# Patient Record
Sex: Female | Born: 1990 | Race: White | Hispanic: No | Smoking: Former smoker
Health system: Southern US, Community
[De-identification: ages and names within clinical notes are randomized; demographics above are authoritative.]

## PROBLEM LIST (undated history)

## (undated) DIAGNOSIS — N809 Endometriosis, unspecified: Secondary | ICD-10-CM

## (undated) DIAGNOSIS — F32A Depression, unspecified: Secondary | ICD-10-CM

## (undated) DIAGNOSIS — Z9189 Other specified personal risk factors, not elsewhere classified: Secondary | ICD-10-CM

## (undated) DIAGNOSIS — B0089 Other herpesviral infection: Secondary | ICD-10-CM

## (undated) DIAGNOSIS — Z789 Other specified health status: Secondary | ICD-10-CM

## (undated) DIAGNOSIS — F319 Bipolar disorder, unspecified: Secondary | ICD-10-CM

## (undated) DIAGNOSIS — M797 Fibromyalgia: Secondary | ICD-10-CM

## (undated) DIAGNOSIS — Z803 Family history of malignant neoplasm of breast: Secondary | ICD-10-CM

## (undated) DIAGNOSIS — M5430 Sciatica, unspecified side: Secondary | ICD-10-CM

## (undated) DIAGNOSIS — F419 Anxiety disorder, unspecified: Secondary | ICD-10-CM

## (undated) DIAGNOSIS — J45909 Unspecified asthma, uncomplicated: Secondary | ICD-10-CM

## (undated) DIAGNOSIS — F329 Major depressive disorder, single episode, unspecified: Secondary | ICD-10-CM

## (undated) DIAGNOSIS — O009 Unspecified ectopic pregnancy without intrauterine pregnancy: Secondary | ICD-10-CM

## (undated) DIAGNOSIS — G5603 Carpal tunnel syndrome, bilateral upper limbs: Secondary | ICD-10-CM

## (undated) DIAGNOSIS — E039 Hypothyroidism, unspecified: Secondary | ICD-10-CM

## (undated) DIAGNOSIS — M722 Plantar fascial fibromatosis: Secondary | ICD-10-CM

## (undated) DIAGNOSIS — H9193 Unspecified hearing loss, bilateral: Secondary | ICD-10-CM

## (undated) DIAGNOSIS — K449 Diaphragmatic hernia without obstruction or gangrene: Secondary | ICD-10-CM

## (undated) HISTORY — PX: NO PAST SURGERIES: SHX2092

## (undated) HISTORY — DX: Other herpesviral infection: B00.89

## (undated) HISTORY — DX: Fibromyalgia: M79.7

## (undated) HISTORY — DX: Sciatica, unspecified side: M54.30

## (undated) HISTORY — DX: Unspecified asthma, uncomplicated: J45.909

## (undated) HISTORY — DX: Plantar fascial fibromatosis: M72.2

## (undated) HISTORY — DX: Family history of malignant neoplasm of breast: Z80.3

## (undated) HISTORY — DX: Other specified personal risk factors, not elsewhere classified: Z91.89

## (undated) HISTORY — DX: Diaphragmatic hernia without obstruction or gangrene: K44.9

## (undated) HISTORY — PX: TUBAL LIGATION: SHX77

## (undated) HISTORY — DX: Carpal tunnel syndrome, bilateral upper limbs: G56.03

---
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
## 2009-11-17 ENCOUNTER — Emergency Department: Payer: Self-pay | Admitting: Emergency Medicine

## 2010-03-12 ENCOUNTER — Emergency Department: Payer: Self-pay | Admitting: Emergency Medicine

## 2010-11-14 ENCOUNTER — Emergency Department: Payer: Self-pay | Admitting: Emergency Medicine

## 2011-06-06 ENCOUNTER — Encounter: Payer: Self-pay | Admitting: Maternal & Fetal Medicine

## 2011-06-20 ENCOUNTER — Emergency Department: Payer: Self-pay | Admitting: Internal Medicine

## 2011-12-03 ENCOUNTER — Inpatient Hospital Stay: Payer: Self-pay

## 2011-12-03 LAB — DRUG SCREEN, URINE
Amphetamines, Ur Screen: NEGATIVE (ref ?–1000)
Barbiturates, Ur Screen: NEGATIVE (ref ?–200)
Benzodiazepine, Ur Scrn: NEGATIVE (ref ?–200)
Cannabinoid 50 Ng, Ur ~~LOC~~: NEGATIVE (ref ?–50)
Methadone, Ur Screen: NEGATIVE (ref ?–300)
Opiate, Ur Screen: NEGATIVE (ref ?–300)
Phencyclidine (PCP) Ur S: NEGATIVE (ref ?–25)

## 2011-12-03 LAB — CBC WITH DIFFERENTIAL/PLATELET
Basophil #: 0 10*3/uL (ref 0.0–0.1)
Eosinophil #: 0 10*3/uL (ref 0.0–0.7)
Eosinophil %: 0 %
HGB: 10.6 g/dL — ABNORMAL LOW (ref 12.0–16.0)
MCH: 31.5 pg (ref 26.0–34.0)
MCHC: 33.2 g/dL (ref 32.0–36.0)
MCV: 95 fL (ref 80–100)
Monocyte #: 1 10*3/uL — ABNORMAL HIGH (ref 0.0–0.7)
Neutrophil #: 14.4 10*3/uL — ABNORMAL HIGH (ref 1.4–6.5)
Neutrophil %: 87.5 %
Platelet: 98 10*3/uL — ABNORMAL LOW (ref 150–440)
RDW: 13.3 % (ref 11.5–14.5)
WBC: 16.5 10*3/uL — ABNORMAL HIGH (ref 3.6–11.0)

## 2011-12-05 LAB — HEMATOCRIT: HCT: 27.1 % — ABNORMAL LOW (ref 35.0–47.0)

## 2012-10-12 ENCOUNTER — Ambulatory Visit: Payer: Self-pay | Admitting: Internal Medicine

## 2012-10-28 ENCOUNTER — Ambulatory Visit: Payer: Self-pay | Admitting: Internal Medicine

## 2012-11-19 ENCOUNTER — Ambulatory Visit: Payer: Self-pay | Admitting: Internal Medicine

## 2013-03-30 ENCOUNTER — Ambulatory Visit: Payer: Self-pay | Admitting: Physician Assistant

## 2013-05-22 ENCOUNTER — Emergency Department: Payer: Self-pay | Admitting: Emergency Medicine

## 2013-05-22 LAB — URINALYSIS, COMPLETE
Bilirubin,UR: NEGATIVE
Glucose,UR: NEGATIVE mg/dL (ref 0–75)
Ketone: NEGATIVE
Nitrite: NEGATIVE
Ph: 6 (ref 4.5–8.0)
Specific Gravity: 1.023 (ref 1.003–1.030)
Squamous Epithelial: 5

## 2013-05-22 LAB — COMPREHENSIVE METABOLIC PANEL
Albumin: 3.6 g/dL (ref 3.4–5.0)
Alkaline Phosphatase: 100 U/L (ref 50–136)
Anion Gap: 10 (ref 7–16)
BUN: 9 mg/dL (ref 7–18)
Calcium, Total: 8.4 mg/dL — ABNORMAL LOW (ref 8.5–10.1)
Chloride: 107 mmol/L (ref 98–107)
Co2: 22 mmol/L (ref 21–32)
Creatinine: 0.88 mg/dL (ref 0.60–1.30)
EGFR (African American): >60
Osmolality: 278 (ref 275–301)
Potassium: 3.9 mmol/L (ref 3.5–5.1)
SGPT (ALT): 20 U/L (ref 12–78)
Sodium: 139 mmol/L (ref 136–145)
Total Protein: 7.6 g/dL (ref 6.4–8.2)

## 2013-05-22 LAB — ETHANOL: Ethanol: <3 mg/dL

## 2013-05-22 LAB — CBC
HCT: 37.3 % (ref 35.0–47.0)
HGB: 12.7 g/dL (ref 12.0–16.0)
MCH: 30 pg (ref 26.0–34.0)
MCHC: 34.1 g/dL (ref 32.0–36.0)
MCV: 88 fL (ref 80–100)
Platelet: 230 10*3/uL (ref 150–440)
RBC: 4.24 10*6/uL (ref 3.80–5.20)
RDW: 13 % (ref 11.5–14.5)

## 2013-05-22 LAB — DRUG SCREEN, URINE
Methadone, Ur Screen: NEGATIVE (ref ?–300)
Opiate, Ur Screen: NEGATIVE (ref ?–300)
Phencyclidine (PCP) Ur S: NEGATIVE (ref ?–25)
Tricyclic, Ur Screen: NEGATIVE (ref ?–1000)

## 2013-05-22 LAB — TSH: Thyroid Stimulating Horm: 2.44 u[IU]/mL

## 2013-06-29 ENCOUNTER — Ambulatory Visit: Payer: Self-pay | Admitting: Internal Medicine

## 2014-08-26 ENCOUNTER — Ambulatory Visit: Payer: Self-pay | Admitting: Internal Medicine

## 2014-08-26 LAB — HCG, QUANTITATIVE, PREGNANCY: Beta Hcg, Quant.: <1 m[IU]/mL — ABNORMAL LOW

## 2015-03-10 NOTE — Consult Note (Signed)
Brief Consult Note: Diagnosis: chronic depression.   Patient was seen by consultant.   Consult note dictated.   Discussed with Attending MD.   Comments: Psychiatry: Patient seen and chart reviewed. Also spoke with patient's husband. Patient has chronic depression and irritability and had voiced SI yesterday but now has rested and calmed down and denies any wish to die. Able to cite positive things in her life to live for and agrees to continue outpt care. Currently calm affect, lucid without agitation or thought disorder, and able to think through problems rationally. Husband agrees she is not at risk of self injury if discharged and is fine with her coming home. ER attending agrees. Pt can go home with no med change and [facility].  Electronic Signatures: Audery Amellapacs, Jontavius Rabalais T (MD)  (Signed 06-Jul-14 14:25)  Authored: Brief Consult Note   Last Updated: 06-Jul-14 14:25 by Audery Amellapacs, Delontae Lamm T (MD)
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
## 2015-03-28 NOTE — H&P (Signed)
L&D Evaluation:  History:   HPI 24 year old G2 P0010 with EDC=12/07/2011 by ultrasound presented with c/o painful ctxs. Denies bleeding or LOF. Prenatal care [facility] at [facility] and [facility]. Prenatal course remarkable for bipolar disorder/ADHD/PTSD/anxiety and [facility] and is currently on Seroquel 50 mgm in PM and Zoloft 100 mgm daily and prn Xanax 0.25 mgm. Hx of polysubstance abuse/use of MJ this pregnancy. (UDS 12/6 negative). BMI>30    Presents with contractions    Patient's Medical History Bipolar disorder/ADHD/Anxiety/PTSD. Obesity.    Patient's Surgical History none    Medications Pre Natal Vitamins  Iron  Serroquel 50 mgm daily, Zoloft 100 mgm daily, Xanax 0.25 mgm daily prn    Allergies other, seafood    Social History Hx polysubstance abuse. MJ use this pregnancy.    Family History Non-Contributory   ROS:   ROS see HPI   Exam:   Vital Signs stable    General breathing with ctxs    Mental Status clear    Chest clear    Heart normal sinus rhythm, no murmur/gallop/rubs    Abdomen gravid, tender with contractions    Estimated Fetal Weight Average for gestational age    Fetal Position vtx    Reflexes 2+    Pelvic no external lesions, 3/80%/-1    Mebranes Intact    FHT 120 with accels and mod variability. two isolated mild variable decels to 90-100    FHT Description mod variability    Ucx q274min    Skin dry   Impression:   Impression IUP at 39 3/7 weeks in early labor   Plan:   Plan Expectant management. Stadol for pain. Epidural when progressing   Electronic Signatures: Trinna BalloonGutierrez, Bilan Tedesco L (CNM)  (Signed 15-Jan-13 14:03)  Authored: L&D Evaluation   Last Updated: 15-Jan-13 14:03 by Trinna BalloonGutierrez, Rica Heather L (CNM)

## 2015-06-02 ENCOUNTER — Other Ambulatory Visit: Payer: Self-pay | Admitting: Physician Assistant

## 2015-06-02 ENCOUNTER — Ambulatory Visit
Admission: RE | Admit: 2015-06-02 | Discharge: 2015-06-02 | Disposition: A | Discharge status: Home / Self Care | Payer: Medicaid Other | Source: Ambulatory Visit | Attending: Physician Assistant | Admitting: Physician Assistant

## 2015-06-02 DIAGNOSIS — M5440 Lumbago with sciatica, unspecified side: Principal | ICD-10-CM

## 2015-06-02 DIAGNOSIS — M25559 Pain in unspecified hip: Secondary | ICD-10-CM | POA: Diagnosis present

## 2015-06-02 DIAGNOSIS — M545 Low back pain, unspecified: Secondary | ICD-10-CM

## 2015-06-02 DIAGNOSIS — M549 Dorsalgia, unspecified: Secondary | ICD-10-CM | POA: Diagnosis not present

## 2015-12-05 ENCOUNTER — Emergency Department
Admission: EM | Admit: 2015-12-05 | Discharge: 2015-12-05 | Disposition: A | Discharge status: Home / Self Care | Payer: Medicaid Other

## 2015-12-05 NOTE — ED Notes (Signed)
No answer when called from lobby 

## 2016-11-13 ENCOUNTER — Encounter: Payer: Self-pay | Admitting: Emergency Medicine

## 2016-11-13 ENCOUNTER — Emergency Department
Admission: EM | Admit: 2016-11-13 | Discharge: 2016-11-13 | Disposition: A | Discharge status: Home / Self Care | Payer: Self-pay | Attending: Emergency Medicine | Admitting: Emergency Medicine

## 2016-11-13 ENCOUNTER — Emergency Department: Payer: Self-pay

## 2016-11-13 DIAGNOSIS — O200 Threatened abortion: Secondary | ICD-10-CM | POA: Insufficient documentation

## 2016-11-13 DIAGNOSIS — F17200 Nicotine dependence, unspecified, uncomplicated: Secondary | ICD-10-CM | POA: Insufficient documentation

## 2016-11-13 DIAGNOSIS — Z3A01 Less than 8 weeks gestation of pregnancy: Secondary | ICD-10-CM | POA: Insufficient documentation

## 2016-11-13 DIAGNOSIS — O99331 Smoking (tobacco) complicating pregnancy, first trimester: Secondary | ICD-10-CM | POA: Insufficient documentation

## 2016-11-13 DIAGNOSIS — R102 Pelvic and perineal pain: Secondary | ICD-10-CM

## 2016-11-13 LAB — CBC WITH DIFFERENTIAL/PLATELET
BASOS ABS: 0 10*3/uL (ref 0–0.1)
BASOS PCT: 1 %
EOS ABS: 0.3 10*3/uL (ref 0–0.7)
Eosinophils Relative: 3 %
HEMATOCRIT: 35.9 % (ref 35.0–47.0)
Hemoglobin: 12.1 g/dL (ref 12.0–16.0)
Lymphocytes Relative: 27 %
Lymphs Abs: 2.6 10*3/uL (ref 1.0–3.6)
MCH: 30.3 pg (ref 26.0–34.0)
MCHC: 33.6 g/dL (ref 32.0–36.0)
MCV: 90.2 fL (ref 80.0–100.0)
MONOS PCT: 5 %
Monocytes Absolute: 0.5 10*3/uL (ref 0.2–0.9)
NEUTROS ABS: 6.2 10*3/uL (ref 1.4–6.5)
Neutrophils Relative %: 64 %
PLATELETS: 262 10*3/uL (ref 150–440)
RBC: 3.98 MIL/uL (ref 3.80–5.20)
RDW: 13.4 % (ref 11.5–14.5)
WBC: 9.7 10*3/uL (ref 3.6–11.0)

## 2016-11-13 LAB — URINALYSIS, COMPLETE (UACMP) WITH MICROSCOPIC
BACTERIA UA: NONE SEEN
BILIRUBIN URINE: NEGATIVE
Glucose, UA: NEGATIVE mg/dL
HGB URINE DIPSTICK: NEGATIVE
KETONES UR: NEGATIVE mg/dL
LEUKOCYTES UA: NEGATIVE
NITRITE: NEGATIVE
Protein, ur: NEGATIVE mg/dL
RBC / HPF: NONE SEEN RBC/hpf (ref 0–5)
SPECIFIC GRAVITY, URINE: 1.018 (ref 1.005–1.030)
pH: 7 (ref 5.0–8.0)

## 2016-11-13 LAB — COMPREHENSIVE METABOLIC PANEL
ALBUMIN: 3.8 g/dL (ref 3.5–5.0)
ALT: 11 U/L — ABNORMAL LOW (ref 14–54)
ANION GAP: 6 (ref 5–15)
AST: 15 U/L (ref 15–41)
Alkaline Phosphatase: 54 U/L (ref 38–126)
BUN: 7 mg/dL (ref 6–20)
CHLORIDE: 107 mmol/L (ref 101–111)
CO2: 26 mmol/L (ref 22–32)
Calcium: 8.9 mg/dL (ref 8.9–10.3)
Creatinine, Ser: 0.78 mg/dL (ref 0.44–1.00)
GFR calc Af Amer: >60 mL/min (ref >60)
GFR calc non Af Amer: >60 mL/min (ref >60)
GLUCOSE: 91 mg/dL (ref 65–99)
POTASSIUM: 4.1 mmol/L (ref 3.5–5.1)
SODIUM: 139 mmol/L (ref 135–145)
TOTAL PROTEIN: 7.3 g/dL (ref 6.5–8.1)

## 2016-11-13 LAB — HCG, QUANTITATIVE, PREGNANCY: hCG, Beta Chain, Quant, S: 350 m[IU]/mL — ABNORMAL HIGH (ref <5)

## 2016-11-13 LAB — POCT PREGNANCY, URINE: PREG TEST UR: POSITIVE — AB

## 2016-11-13 MED ORDER — ACETAMINOPHEN 500 MG PO TABS
1000.0000 mg | ORAL_TABLET | Freq: Once | ORAL | Status: AC
  Administered 2016-11-13: 1000 mg via ORAL
  Filled 2016-11-13: qty 2

## 2016-11-13 NOTE — ED Notes (Signed)
Patient transported to Ultrasound 

## 2016-11-13 NOTE — ED Provider Notes (Signed)
[facility] Emergency Department Provider Note        Time seen: ----------------------------------------- 2:38 PM on 11/13/2016 -----------------------------------------    I have reviewed the triage vital signs and the nursing notes.   HISTORY  Chief Complaint Vaginal Bleeding    HPI Claire Cari CarawayLloyd Ashley is a 25 y.o. female who presents to ER for vaginal bleeding this morning. Patient states she's approximate [redacted] weeks pregnant she began having some abdominal pain and cramping as well as vaginal spotting. She is G3 P1 Ab1. She denies fevers, chills, dysuria but has has some breast soreness.   History reviewed. No pertinent past medical history.  There are no active problems to display for this patient.   History reviewed. No pertinent surgical history.  Allergies Patient has no known allergies.  Social History Social History  Substance Use Topics  . Smoking status: Current Every Day Smoker  . Smokeless tobacco: Never Used  . Alcohol use Yes    Review of Systems Constitutional: Negative for fever. Cardiovascular: Negative for chest pain. Respiratory: Negative for shortness of breath. Gastrointestinal: Positive for abdominal pain Genitourinary: Negative for dysuria.Positive for vaginal bleeding Musculoskeletal: Negative for back pain. Skin: Negative for rash. Neurological: Negative for headaches, focal weakness or numbness.  10-point ROS otherwise negative.  ____________________________________________   PHYSICAL EXAM:  VITAL SIGNS: ED Triage Vitals  Enc Vitals Group     BP 11/13/16 1350 127/86     Pulse Rate 11/13/16 1350 92     Resp 11/13/16 1350 20     Temp 11/13/16 1350 97.4 F (36.3 C)     Temp Source 11/13/16 1350 Oral     SpO2 11/13/16 1350 99 %     Weight 11/13/16 1351 215 lb (97.5 kg)     Height 11/13/16 1351 5\' 4"  (1.626 m)     Head Circumference --      Peak Flow --      Pain Score 11/13/16 1351 9     Pain Loc --       Pain Edu? --      Excl. in GC? --     Constitutional: Alert and oriented. Well appearing and in no distress. Eyes: Conjunctivae are normal. PERRL. Normal extraocular movements. ENT   Head: Normocephalic and atraumatic.   Nose: No congestion/rhinnorhea.   Mouth/Throat: Mucous membranes are moist.   Neck: No stridor. Cardiovascular: Normal rate, regular rhythm. No murmurs, rubs, or gallops. Respiratory: Normal respiratory effort without tachypnea nor retractions. Breath sounds are clear and equal bilaterally. No wheezes/rales/rhonchi. Gastrointestinal: Soft and nontender. Normal bowel sounds Musculoskeletal: Nontender with normal range of motion in all extremities. No lower extremity tenderness nor edema. Neurologic:  Normal speech and language. No gross focal neurologic deficits are appreciated.  Skin:  Skin is warm, dry and intact. No rash noted. Psychiatric: Mood and affect are normal. Speech and behavior are normal.  ____________________________________________  ED COURSE:  Pertinent labs & imaging results that were available during my care of the patient were reviewed by me and considered in my medical decision making (see chart for details). Clinical Course   Patient presents to the ER to distress, likely threatened miscarriage. We will assess with labs and ultrasound.  Procedures ____________________________________________   LABS (pertinent positives/negatives)  Labs Reviewed  COMPREHENSIVE METABOLIC PANEL - Abnormal; Notable for the following:       Result Value   ALT 11 (*)    Total Bilirubin <0.1 (*)    All other components within normal limits  HCG, QUANTITATIVE, PREGNANCY - Abnormal; Notable for the following:    hCG, Beta Chain, Quant, S 350 (*)    All other components within normal limits  URINALYSIS, COMPLETE (UACMP) WITH MICROSCOPIC - Abnormal; Notable for the following:    Color, Urine YELLOW (*)    APPearance CLEAR (*)    Squamous  Epithelial / LPF 0-5 (*)    All other components within normal limits  POCT PREGNANCY, URINE - Abnormal; Notable for the following:    Preg Test, Ur POSITIVE (*)    All other components within normal limits  CBC WITH DIFFERENTIAL/PLATELET  POC URINE PREG, ED    RADIOLOGY Pregnancy ultrasound IMPRESSION: 1. Possible intrauterine gestational sac but no confirmatory yolk sac or fetal pole. Recommend follow-up quantitative B-HCG levels and follow-up US in 14 days to assess viability. This recommendation follows SRU consensus guidelines: Diagnostic Criteria for Nonviable Pregnancy Early in the First Trimester. Malva Limes Engl J Med 2013; 161:0960-45; 369:1443-51. 2. Negative for ovarian torsion. ____________________________________________  FINAL ASSESSMENT AND PLAN  Threatened miscarriage  Plan: Patient with labs and imaging as dictated above. Ultrasound findings are indeterminate. She'll be advised to follow-up for repeat hCG levels and possibly ultrasound as well. She is stable for outpatient follow-up.   Emily FilbertWilliams, Gunnard Dorrance E, MD   Note: This dictation was prepared with Dragon dictation. Any transcriptional errors that result from this process are unintentional    Emily FilbertJonathan E Mercer Peifer, MD 11/13/16 [phone]

## 2016-11-13 NOTE — ED Triage Notes (Signed)
Pt to ed with c/o vaginal bleeding this am.  Pt states she is approx [redacted] weeks pregnant and is having abd pain and cramping.  G3 AB1 P1

## 2016-11-23 ENCOUNTER — Encounter: Payer: Self-pay | Admitting: Emergency Medicine

## 2016-11-23 DIAGNOSIS — O039 Complete or unspecified spontaneous abortion without complication: Principal | ICD-10-CM | POA: Insufficient documentation

## 2016-11-23 DIAGNOSIS — O99331 Smoking (tobacco) complicating pregnancy, first trimester: Secondary | ICD-10-CM | POA: Insufficient documentation

## 2016-11-23 DIAGNOSIS — F17210 Nicotine dependence, cigarettes, uncomplicated: Secondary | ICD-10-CM | POA: Insufficient documentation

## 2016-11-23 DIAGNOSIS — Z3A01 Less than 8 weeks gestation of pregnancy: Secondary | ICD-10-CM | POA: Insufficient documentation

## 2016-11-23 NOTE — ED Triage Notes (Signed)
Pt is experiencing abdominal pain and is around six pregnant at this time. Pt has been experiencing spotting and now some clots are emerging. Pt reports lower right pelvic pain. Pt is ambulatory and in NAD at this time in triage.

## 2016-11-24 ENCOUNTER — Emergency Department: Payer: Medicaid Other

## 2016-11-24 ENCOUNTER — Observation Stay
Admission: EM | Admit: 2016-11-24 | Discharge: 2016-11-24 | Disposition: A | Discharge status: Home / Self Care | Payer: Medicaid Other | Attending: Obstetrics and Gynecology | Admitting: Obstetrics and Gynecology

## 2016-11-24 ENCOUNTER — Other Ambulatory Visit: Payer: Medicaid Other

## 2016-11-24 DIAGNOSIS — O009 Unspecified ectopic pregnancy without intrauterine pregnancy: Secondary | ICD-10-CM

## 2016-11-24 DIAGNOSIS — N939 Abnormal uterine and vaginal bleeding, unspecified: Secondary | ICD-10-CM

## 2016-11-24 DIAGNOSIS — O00101 Right tubal pregnancy without intrauterine pregnancy: Secondary | ICD-10-CM

## 2016-11-24 HISTORY — DX: Endometriosis, unspecified: N80.9

## 2016-11-24 HISTORY — DX: Unspecified ectopic pregnancy without intrauterine pregnancy: O00.90

## 2016-11-24 LAB — CBC WITH DIFFERENTIAL/PLATELET
BASOS ABS: 0 10*3/uL (ref 0–0.1)
BASOS ABS: 0.1 10*3/uL (ref 0–0.1)
Basophils Relative: 0 %
Basophils Relative: 1 %
Eosinophils Absolute: 0.5 10*3/uL (ref 0–0.7)
Eosinophils Absolute: 0.5 10*3/uL (ref 0–0.7)
Eosinophils Relative: 5 %
Eosinophils Relative: 5 %
HCT: 33.3 % — ABNORMAL LOW (ref 35.0–47.0)
HEMATOCRIT: 36.8 % (ref 35.0–47.0)
HEMOGLOBIN: 11.1 g/dL — AB (ref 12.0–16.0)
Hemoglobin: 12.2 g/dL (ref 12.0–16.0)
LYMPHS PCT: 41 %
LYMPHS PCT: 42 %
Lymphs Abs: 4.1 10*3/uL — ABNORMAL HIGH (ref 1.0–3.6)
Lymphs Abs: 4.7 10*3/uL — ABNORMAL HIGH (ref 1.0–3.6)
MCH: 29.8 pg (ref 26.0–34.0)
MCH: 29.8 pg (ref 26.0–34.0)
MCHC: 33 g/dL (ref 32.0–36.0)
MCHC: 33.3 g/dL (ref 32.0–36.0)
MCV: 89.7 fL (ref 80.0–100.0)
MCV: 90.4 fL (ref 80.0–100.0)
MONO ABS: 0.6 10*3/uL (ref 0.2–0.9)
MONOS PCT: 5 %
Monocytes Absolute: 0.5 10*3/uL (ref 0.2–0.9)
Monocytes Relative: 5 %
NEUTROS ABS: 4.8 10*3/uL (ref 1.4–6.5)
NEUTROS ABS: 5.3 10*3/uL (ref 1.4–6.5)
NEUTROS PCT: 49 %
Neutrophils Relative %: 47 %
Platelets: 212 10*3/uL (ref 150–440)
Platelets: 236 10*3/uL (ref 150–440)
RBC: 3.72 MIL/uL — AB (ref 3.80–5.20)
RBC: 4.08 MIL/uL (ref 3.80–5.20)
RDW: 13.5 % (ref 11.5–14.5)
RDW: 13.5 % (ref 11.5–14.5)
WBC: 10 10*3/uL (ref 3.6–11.0)
WBC: 11.3 10*3/uL — ABNORMAL HIGH (ref 3.6–11.0)

## 2016-11-24 LAB — URINALYSIS, ROUTINE W REFLEX MICROSCOPIC
Bilirubin Urine: NEGATIVE
GLUCOSE, UA: NEGATIVE mg/dL
Ketones, ur: NEGATIVE mg/dL
Leukocytes, UA: NEGATIVE
Nitrite: NEGATIVE
PH: 5 (ref 5.0–8.0)
Protein, ur: 30 mg/dL — AB
Specific Gravity, Urine: 1.024 (ref 1.005–1.030)

## 2016-11-24 LAB — BASIC METABOLIC PANEL
ANION GAP: 6 (ref 5–15)
BUN: 10 mg/dL (ref 6–20)
CALCIUM: 8.9 mg/dL (ref 8.9–10.3)
CHLORIDE: 106 mmol/L (ref 101–111)
CO2: 25 mmol/L (ref 22–32)
Creatinine, Ser: 0.64 mg/dL (ref 0.44–1.00)
GFR calc Af Amer: >60 mL/min (ref >60)
GFR calc non Af Amer: >60 mL/min (ref >60)
GLUCOSE: 88 mg/dL (ref 65–99)
Potassium: 3.6 mmol/L (ref 3.5–5.1)
Sodium: 137 mmol/L (ref 135–145)

## 2016-11-24 LAB — HCG, QUANTITATIVE, PREGNANCY
HCG, BETA CHAIN, QUANT, S: 1772 m[IU]/mL — AB (ref <5)
hCG, Beta Chain, Quant, S: 1268 m[IU]/mL — ABNORMAL HIGH (ref <5)

## 2016-11-24 MED ORDER — MORPHINE SULFATE (PF) 4 MG/ML IV SOLN
INTRAVENOUS | Status: AC
  Administered 2016-11-24: 4 mg via INTRAVENOUS
  Filled 2016-11-24: qty 1

## 2016-11-24 MED ORDER — ONDANSETRON HCL 4 MG/2ML IJ SOLN
4.0000 mg | Freq: Once | INTRAMUSCULAR | Status: AC
  Administered 2016-11-24: 4 mg via INTRAVENOUS

## 2016-11-24 MED ORDER — KETOROLAC TROMETHAMINE 30 MG/ML IJ SOLN
30.0000 mg | Freq: Four times a day (QID) | INTRAMUSCULAR | Status: DC | PRN
  Administered 2016-11-24: 30 mg via INTRAVENOUS
  Filled 2016-11-24: qty 1

## 2016-11-24 MED ORDER — ONDANSETRON HCL 4 MG/2ML IJ SOLN
INTRAMUSCULAR | Status: AC
  Administered 2016-11-24: 4 mg via INTRAVENOUS
  Filled 2016-11-24: qty 2

## 2016-11-24 MED ORDER — IBUPROFEN 600 MG PO TABS: 600.0000 mg | ORAL_TABLET | Freq: Four times a day (QID) | ORAL | 3 refills | Status: DC | PRN

## 2016-11-24 MED ORDER — ONDANSETRON HCL 4 MG/2ML IJ SOLN: 4.0000 mg | Freq: Four times a day (QID) | INTRAMUSCULAR | Status: DC | PRN

## 2016-11-24 MED ORDER — PRENATAL MULTIVITAMIN CH: 1.0000 | ORAL_TABLET | Freq: Every day | ORAL | Status: DC

## 2016-11-24 MED ORDER — MORPHINE SULFATE (PF) 4 MG/ML IV SOLN
4.0000 mg | Freq: Once | INTRAVENOUS | Status: AC
  Administered 2016-11-24: 4 mg via INTRAVENOUS

## 2016-11-24 MED ORDER — SODIUM CHLORIDE 0.9 % IV BOLUS (SEPSIS)
1000.0000 mL | Freq: Once | INTRAVENOUS | Status: AC
  Administered 2016-11-24: 1000 mL via INTRAVENOUS

## 2016-11-24 MED ORDER — INFLUENZA VAC SPLIT QUAD 0.5 ML IM SUSY: 0.5000 mL | PREFILLED_SYRINGE | INTRAMUSCULAR | Status: DC

## 2016-11-24 NOTE — Final Progress Note (Signed)
Physician Final Progress Note  Patient ID: Claire Frederick [name] MRN: [id number] DOB/AGE: Oct 10, 1991 26 y.o.  Admit date: 11/24/2016 Admitting provider: Vena AustriaAndreas Kiarah Eckstein, MD Discharge date: 11/24/2016   Admission Diagnoses: Completed abortion  Discharge Diagnoses: Completed abortion  Consults: None  HCG drop of 500 in 6-hrs, no further cramping or abdominal pain.  Still some bleeding as would be expected following SAB, follow up in clinic with myself sometime next week  Significant Findings/ Diagnostic Studies:  Results for orders placed or performed during the hospital encounter of 11/24/16 (from the past 24 hour(s))  hCG, quantitative, pregnancy     Status: Abnormal   Collection Time: 11/23/16 11:43 PM  Result Value Ref Range   hCG, Beta Chain, Quant, S 1,772 (H) <5 mIU/mL  Urinalysis, Routine w reflex microscopic     Status: Abnormal   Collection Time: 11/23/16 11:43 PM  Result Value Ref Range   Color, Urine YELLOW (A) YELLOW   APPearance HAZY (A) CLEAR   Specific Gravity, Urine 1.024 1.005 - 1.030   pH 5.0 5.0 - 8.0   Glucose, UA NEGATIVE NEGATIVE mg/dL   Hgb urine dipstick LARGE (A) NEGATIVE   Bilirubin Urine NEGATIVE NEGATIVE   Ketones, ur NEGATIVE NEGATIVE mg/dL   Protein, ur 30 (A) NEGATIVE mg/dL   Nitrite NEGATIVE NEGATIVE   Leukocytes, UA NEGATIVE NEGATIVE   RBC / HPF TOO NUMEROUS TO COUNT 0 - 5 RBC/hpf   WBC, UA TOO NUMEROUS TO COUNT 0 - 5 WBC/hpf   Bacteria, UA RARE (A) NONE SEEN   Squamous Epithelial / LPF 0-5 (A) NONE SEEN   Mucous PRESENT   CBC with Differential     Status: Abnormal   Collection Time: 11/23/16 11:43 PM  Result Value Ref Range   WBC 11.3 (H) 3.6 - 11.0 K/uL   RBC 4.08 3.80 - 5.20 MIL/uL   Hemoglobin 12.2 12.0 - 16.0 g/dL   HCT 40.936.8 81.135.0 - 91.447.0 %   MCV 90.4 80.0 - 100.0 fL   MCH 29.8 26.0 - 34.0 pg   MCHC 33.0 32.0 - 36.0 g/dL   RDW 78.213.5 95.611.5 - 21.314.5 %   Platelets 236 150 - 440 K/uL   Neutrophils Relative % 47 %   Neutro Abs 5.3 1.4 - 6.5  K/uL   Lymphocytes Relative 42 %   Lymphs Abs 4.7 (H) 1.0 - 3.6 K/uL   Monocytes Relative 5 %   Monocytes Absolute 0.6 0.2 - 0.9 K/uL   Eosinophils Relative 5 %   Eosinophils Absolute 0.5 0 - 0.7 K/uL   Basophils Relative 1 %   Basophils Absolute 0.1 0 - 0.1 K/uL  Basic metabolic panel     Status: None   Collection Time: 11/23/16 11:43 PM  Result Value Ref Range   Sodium 137 135 - 145 mmol/L   Potassium 3.6 3.5 - 5.1 mmol/L   Chloride 106 101 - 111 mmol/L   CO2 25 22 - 32 mmol/L   Glucose, Bld 88 65 - 99 mg/dL   BUN 10 6 - 20 mg/dL   Creatinine, Ser 0.860.64 0.44 - 1.00 mg/dL   Calcium 8.9 8.9 - 57.810.3 mg/dL   GFR calc non Af Amer >60 >60 mL/min   GFR calc Af Amer >60 >60 mL/min   Anion gap 6 5 - 15  CBC WITH DIFFERENTIAL     Status: Abnormal   Collection Time: 11/24/16  6:12 AM  Result Value Ref Range   WBC 10.0 3.6 - 11.0 K/uL   RBC  3.72 (L) 3.80 - 5.20 MIL/uL   Hemoglobin 11.1 (L) 12.0 - 16.0 g/dL   HCT 53.6 (L) 64.4 - 03.4 %   MCV 89.7 80.0 - 100.0 fL   MCH 29.8 26.0 - 34.0 pg   MCHC 33.3 32.0 - 36.0 g/dL   RDW 74.2 59.5 - 63.8 %   Platelets 212 150 - 440 K/uL   Neutrophils Relative % 49 %   Neutro Abs 4.8 1.4 - 6.5 K/uL   Lymphocytes Relative 41 %   Lymphs Abs 4.1 (H) 1.0 - 3.6 K/uL   Monocytes Relative 5 %   Monocytes Absolute 0.5 0.2 - 0.9 K/uL   Eosinophils Relative 5 %   Eosinophils Absolute 0.5 0 - 0.7 K/uL   Basophils Relative 0 %   Basophils Absolute 0.0 0 - 0.1 K/uL  hCG, quantitative, pregnancy     Status: Abnormal   Collection Time: 11/24/16  6:12 AM  Result Value Ref Range   hCG, Beta Chain, Quant, S 1,268 (H) <5 mIU/mL   US Ob Comp < 14 Wks  Result Date: 11/24/2016 CLINICAL DATA:  Vaginal bleeding EXAM: OBSTETRIC <14 WK [place] AND TRANSVAGINAL OB US DOPPLER ULTRASOUND OF OVARIES TECHNIQUE: Both transabdominal and transvaginal ultrasound examinations were performed for complete evaluation of the gestation as well as the maternal uterus, adnexal regions, and  pelvic cul-de-sac. Transvaginal technique was performed to assess early pregnancy. Color and duplex Doppler ultrasound was utilized to evaluate blood flow to the ovaries. COMPARISON:  11/13/2016 FINDINGS: Intrauterine gestational sac: Not visualized Yolk sac:  Not visualized Embryo:  Not visualized Cardiac Activity: Not visualized Maternal uterus/adnexae: Left ovary is within normal limits measuring 3.3 x 1.9 by 2.3 cm. The right ovary measures 3.1 x 1.8 x 3.8 cm. Slightly superior to the right ovary is a heterogenous hyper and hypoechoic masslike area measuring 2.9 x 2.2 by 3 cm. Minimal peripheral vascularity is present. Pulsed Doppler evaluation of both ovaries demonstrates normal appearing low-resistance arterial and venous waveforms. IMPRESSION: 1. No sonographic evidence for ovarian torsion 2. Previously suggested gestational sac within the uterus is not identified on the current exam. There is a heterogenous 3 cm hyper and hypoechoic masslike area that appears separate from the right ovary. In the setting of increasing HCG, and nonvisualization of intrauterine pregnancy, this is concerning for possible ectopic pregnancy. Critical Value/emergent results were called by telephone at the time of interpretation on 11/24/2016 at 2:13 am to Dr. Don Perking, who verbally acknowledged these results. Electronically Signed   By: Jasmine Pang M.D.   On: 11/24/2016 02:14   US Ob Transvaginal  Result Date: 11/24/2016 CLINICAL DATA:  Vaginal bleeding EXAM: OBSTETRIC <14 WK [place] AND TRANSVAGINAL OB US DOPPLER ULTRASOUND OF OVARIES TECHNIQUE: Both transabdominal and transvaginal ultrasound examinations were performed for complete evaluation of the gestation as well as the maternal uterus, adnexal regions, and pelvic cul-de-sac. Transvaginal technique was performed to assess early pregnancy. Color and duplex Doppler ultrasound was utilized to evaluate blood flow to the ovaries. COMPARISON:  11/13/2016 FINDINGS: Intrauterine  gestational sac: Not visualized Yolk sac:  Not visualized Embryo:  Not visualized Cardiac Activity: Not visualized Maternal uterus/adnexae: Left ovary is within normal limits measuring 3.3 x 1.9 by 2.3 cm. The right ovary measures 3.1 x 1.8 x 3.8 cm. Slightly superior to the right ovary is a heterogenous hyper and hypoechoic masslike area measuring 2.9 x 2.2 by 3 cm. Minimal peripheral vascularity is present. Pulsed Doppler evaluation of both ovaries demonstrates normal appearing low-resistance arterial and  venous waveforms. IMPRESSION: 1. No sonographic evidence for ovarian torsion 2. Previously suggested gestational sac within the uterus is not identified on the current exam. There is a heterogenous 3 cm hyper and hypoechoic masslike area that appears separate from the right ovary. In the setting of increasing HCG, and nonvisualization of intrauterine pregnancy, this is concerning for possible ectopic pregnancy. Critical Value/emergent results were called by telephone at the time of interpretation on 11/24/2016 at 2:13 am to Dr. Don Perking, who verbally acknowledged these results. Electronically Signed   By: Jasmine Pang M.D.   On: 11/24/2016 02:14   [place] Art/ven Flow Abd Pelv Doppler  Result Date: 11/24/2016 CLINICAL DATA:  Vaginal bleeding EXAM: OBSTETRIC <14 WK [place] AND TRANSVAGINAL OB US DOPPLER ULTRASOUND OF OVARIES TECHNIQUE: Both transabdominal and transvaginal ultrasound examinations were performed for complete evaluation of the gestation as well as the maternal uterus, adnexal regions, and pelvic cul-de-sac. Transvaginal technique was performed to assess early pregnancy. Color and duplex Doppler ultrasound was utilized to evaluate blood flow to the ovaries. COMPARISON:  11/13/2016 FINDINGS: Intrauterine gestational sac: Not visualized Yolk sac:  Not visualized Embryo:  Not visualized Cardiac Activity: Not visualized Maternal uterus/adnexae: Left ovary is within normal limits measuring 3.3 x 1.9 by 2.3 cm. The  right ovary measures 3.1 x 1.8 x 3.8 cm. Slightly superior to the right ovary is a heterogenous hyper and hypoechoic masslike area measuring 2.9 x 2.2 by 3 cm. Minimal peripheral vascularity is present. Pulsed Doppler evaluation of both ovaries demonstrates normal appearing low-resistance arterial and venous waveforms. IMPRESSION: 1. No sonographic evidence for ovarian torsion 2. Previously suggested gestational sac within the uterus is not identified on the current exam. There is a heterogenous 3 cm hyper and hypoechoic masslike area that appears separate from the right ovary. In the setting of increasing HCG, and nonvisualization of intrauterine pregnancy, this is concerning for possible ectopic pregnancy. Critical Value/emergent results were called by telephone at the time of interpretation on 11/24/2016 at 2:13 am to Dr. Don Perking, who verbally acknowledged these results. Electronically Signed   By: Jasmine Pang M.D.   On: 11/24/2016 02:14     Procedures: none  Discharge Condition: good  Disposition: 01-Home or Self Care  Diet: Regular diet  Discharge Activity: Activity as tolerated  Discharge Instructions    Activity as tolerated    Complete by:  As directed    Call MD for:  difficulty breathing, headache or visual disturbances    Complete by:  As directed    Call MD for:  extreme fatigue    Complete by:  As directed    Call MD for:  hives    Complete by:  As directed    Call MD for:  persistant dizziness or light-headedness    Complete by:  As directed    Call MD for:  persistant nausea and vomiting    Complete by:  As directed    Call MD for:  severe uncontrolled pain    Complete by:  As directed    Call MD for:  temperature >100.4    Complete by:  As directed    Diet - low sodium heart healthy    Complete by:  As directed      Allergies as of 11/24/2016   No Known Allergies     Medication List    STOP taking these medications   prenatal multivitamin Tabs tablet      TAKE these medications   acetaminophen 325 MG tablet Commonly known  as:  TYLENOL Take 325 mg by mouth every 6 (six) hours as needed.   ibuprofen 600 MG tablet Commonly known as:  ADVIL,MOTRIN Take 1 tablet (600 mg total) by mouth every 6 (six) hours as needed.        Total time spent taking care of this patient: 90 minutes  Signed: Lorrene Reid 11/24/2016, 7:44 AM

## 2016-11-24 NOTE — ED Provider Notes (Signed)
[facility] Emergency Department Provider Note  ____________________________________________  Time seen: Approximately 2:02 AM  I have reviewed the triage vital signs and the nursing notes.   HISTORY  Chief Complaint Abdominal Pain   HPI Claire Frederick is a 26 y.o. female [phone] with h/o endometriosis currently at [redacted] weeks GA by US done on 11/13/16 who presents for evaluation of abdominal pain. Patient reports one week of sharp intermittent right lower quadrant abdominal pain associated with vaginal bleeding. She was seen here 10 days ago with similar sx and had an ultrasound that showed possible intrauterine gestational sac with no confirmatory yolk sac or fetal pole. Patient today went to an outside hospital where she had a pelvic exam concerning for right adnexal tenderness and since they did not have ultrasound at that facility patient was transferred here for concerns for possible ectopic pregnancy. She reports that her pain is 10 out of 10, located in the right lower quadrant, sharp, intermittent, nonradiating. She denies any prior abdominal surgeries. The pain has been on and off for the last week. She has been using Tylenol at home with no improvement of her pain. No chest pain or shortness of breath, no dizziness, no fever or chills, no vaginal discharge, no dysuria or hematuria.  Past Medical History:  Diagnosis Date  . Endometriosis     Patient Active Problem List   Diagnosis Date Noted  . Ectopic pregnancy 11/24/2016    History reviewed. No pertinent surgical history.  Prior to Admission medications   Not on File    Allergies Patient has no known allergies.  No family history on file.  Social History Social History  Substance Use Topics  . Smoking status: Current Every Day Smoker    Packs/day: 0.10  . Smokeless tobacco: Never Used  . Alcohol use Yes    Review of Systems  Constitutional: Negative for fever. Eyes: Negative for  visual changes. ENT: Negative for sore throat. Neck: No neck pain  Cardiovascular: Negative for chest pain. Respiratory: Negative for shortness of breath. Gastrointestinal: + RLQ abdominal pain. No vomiting or diarrhea. Genitourinary: Negative for dysuria. Musculoskeletal: Negative for back pain. Skin: Negative for rash. Neurological: Negative for headaches, weakness or numbness. Psych: No SI or HI  ____________________________________________   PHYSICAL EXAM:  VITAL SIGNS: ED Triage Vitals  Enc Vitals Group     BP 11/23/16 2340 128/88     Pulse Rate 11/23/16 2340 73     Resp 11/23/16 2340 16     Temp 11/23/16 2340 98.2 F (36.8 C)     Temp Source 11/23/16 2340 Oral     SpO2 11/23/16 2340 100 %     Weight 11/23/16 2339 215 lb (97.5 kg)     Height 11/23/16 2339 5\' 4"  (1.626 m)     Head Circumference --      Peak Flow --      Pain Score 11/23/16 2302 3     Pain Loc --      Pain Edu? --      Excl. in GC? --     Constitutional: Alert and oriented. Well appearing and in no apparent distress. HEENT:      Head: Normocephalic and atraumatic.         Eyes: Conjunctivae are normal. Sclera is non-icteric. EOMI. PERRL      Mouth/Throat: Mucous membranes are moist.       Neck: Supple with no signs of meningismus. Cardiovascular: Regular rate and rhythm. No murmurs,  gallops, or rubs. 2+ symmetrical distal pulses are present in all extremities. No JVD. Respiratory: Normal respiratory effort. Lungs are clear to auscultation bilaterally. No wheezes, crackles, or rhonchi.  Gastrointestinal: Soft, ttp over the RLQ, and non distended with positive bowel sounds. No rebound or guarding. Genitourinary: No CVA tenderness. Musculoskeletal: Nontender with normal range of motion in all extremities. No edema, cyanosis, or erythema of extremities. Neurologic: Normal speech and language. Face is symmetric. Moving all extremities. No gross focal neurologic deficits are appreciated. Skin: Skin is  warm, dry and intact. No rash noted. Psychiatric: Mood and affect are normal. Speech and behavior are normal.  ____________________________________________   LABS (all labs ordered are listed, but only abnormal results are displayed)  Labs Reviewed  HCG, QUANTITATIVE, PREGNANCY - Abnormal; Notable for the following:       Result Value   hCG, Beta Chain, Quant, S 1,772 (*)    All other components within normal limits  URINALYSIS, ROUTINE W REFLEX MICROSCOPIC - Abnormal; Notable for the following:    Color, Urine YELLOW (*)    APPearance HAZY (*)    Hgb urine dipstick LARGE (*)    Protein, ur 30 (*)    Bacteria, UA RARE (*)    Squamous Epithelial / LPF 0-5 (*)    All other components within normal limits  CBC WITH DIFFERENTIAL/PLATELET - Abnormal; Notable for the following:    WBC 11.3 (*)    Lymphs Abs 4.7 (*)    All other components within normal limits  BASIC METABOLIC PANEL  CBC WITH DIFFERENTIAL/PLATELET  HCG, QUANTITATIVE, PREGNANCY   ____________________________________________  EKG   none ____________________________________________  RADIOLOGY  TVUS:  1. No sonographic evidence for ovarian torsion 2. Previously suggested gestational sac within the uterus is not identified on the current exam. There is a heterogenous 3 cm hyper and hypoechoic masslike area that appears separate from the right ovary. In the setting of increasing HCG, and nonvisualization of intrauterine pregnancy, this is concerning for possible ectopic pregnancy. ____________________________________________   PROCEDURES  Procedure(s) performed: None Procedures Critical Care performed: yes  CRITICAL CARE Performed by: Nita Sickle  ?  Total critical care time: 35 min  Critical care time was exclusive of separately billable procedures and treating other patients.  Critical care was necessary to treat or prevent imminent or life-threatening deterioration.  Critical care was time  spent personally by me on the following activities: development of treatment plan with patient and/or surrogate as well as nursing, discussions with consultants, evaluation of patient's response to treatment, examination of patient, obtaining history from patient or surrogate, ordering and performing treatments and interventions, ordering and review of laboratory studies, ordering and review of radiographic studies, pulse oximetry and re-evaluation of patient's condition.  ____________________________________________   INITIAL IMPRESSION / ASSESSMENT AND PLAN / ED COURSE  26 y.o. female [id number] with h/o endometriosis currently at [redacted] weeks GA by US done on 11/13/16 who presents for evaluation of RLQ abdominal pain and vaginal bleeding. Patient is hemodynamically stable and well-appearing, abdominal exam shows tenderness to palpation in the right lower quadrant with no rebound or guarding. Differential diagnoses including ectopic pregnancy versus ovarian torsion versus appendicitis versus PID. Ultrasound is ready been done and results are pending. HCG is trending up. Patient's blood type is A+ so no indication for RhoGAM. Hemoglobin stable at 12.2.  Clinical Course as of Nov 24 305  Wynelle Link Nov 24, 2016  0213 Just received a phone call from the radiologist who tells me that there  is no intrauterine pregnancy visualized on the ultrasound and there is a mass in the right adnexa concerning for an ectopic pregnancy. I spoke with Dr. Chauncey CruelStabler from OB/GYN who will look at the films and come to the ED to evaluate patient. Patient has been updated on the findings.  [CV]    Clinical Course User Index [CV] Nita Sicklearolina Arra Connaughton, MD    Pertinent labs & imaging results that were available during my care of the patient were reviewed by me and considered in my medical decision making (see chart for details).    ____________________________________________   FINAL CLINICAL IMPRESSION(S) / ED DIAGNOSES  Final  diagnoses:  Vaginal bleeding  Ectopic pregnancy  Right tubal pregnancy without intrauterine pregnancy      NEW MEDICATIONS STARTED DURING THIS VISIT:  New Prescriptions   No medications on file     Note:  This document was prepared using Dragon voice recognition software and may include unintentional dictation errors.    Nita Sicklearolina Shana Zavaleta, MD 11/24/16 [phone]

## 2016-11-24 NOTE — ED Notes (Signed)
Transporting patient to room 348-[facility]BA
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
## 2016-11-24 NOTE — ED Notes (Addendum)
Pt reports [redacted] weeks pregnant and vag bleeding x 2 weeks, getting heavier w/ clots today, report right sided pelvic pain, reports G4P1A2.  Pt NAD at this time

## 2016-11-24 NOTE — ED Notes (Signed)
Dr. Veronese at bedside 

## 2016-11-30 ENCOUNTER — Observation Stay
Admission: EM | Admit: 2016-11-30 | Discharge: 2016-12-01 | Disposition: A | Discharge status: Home / Self Care | Payer: Self-pay | Attending: Obstetrics and Gynecology | Admitting: Obstetrics and Gynecology

## 2016-11-30 ENCOUNTER — Encounter: Payer: Self-pay | Admitting: Emergency Medicine

## 2016-11-30 ENCOUNTER — Emergency Department: Payer: Self-pay | Admitting: Anesthesiology

## 2016-11-30 ENCOUNTER — Emergency Department: Payer: Self-pay

## 2016-11-30 ENCOUNTER — Encounter
Admission: EM | Disposition: A | Discharge status: Home / Self Care | Payer: Self-pay | Source: Home / Self Care | Attending: Emergency Medicine

## 2016-11-30 DIAGNOSIS — O009 Unspecified ectopic pregnancy without intrauterine pregnancy: Secondary | ICD-10-CM

## 2016-11-30 DIAGNOSIS — F17210 Nicotine dependence, cigarettes, uncomplicated: Secondary | ICD-10-CM | POA: Insufficient documentation

## 2016-11-30 DIAGNOSIS — O99332 Smoking (tobacco) complicating pregnancy, second trimester: Secondary | ICD-10-CM | POA: Insufficient documentation

## 2016-11-30 DIAGNOSIS — O00101 Right tubal pregnancy without intrauterine pregnancy: Principal | ICD-10-CM | POA: Insufficient documentation

## 2016-11-30 DIAGNOSIS — Z23 Encounter for immunization: Secondary | ICD-10-CM | POA: Insufficient documentation

## 2016-11-30 DIAGNOSIS — Z3A08 8 weeks gestation of pregnancy: Secondary | ICD-10-CM | POA: Insufficient documentation

## 2016-11-30 HISTORY — PX: LAPAROSCOPIC UNILATERAL SALPINGECTOMY: SHX5934

## 2016-11-30 HISTORY — DX: Unspecified ectopic pregnancy without intrauterine pregnancy: O00.90

## 2016-11-30 LAB — CBC
HEMATOCRIT: 35.9 % (ref 35.0–47.0)
HEMOGLOBIN: 12.2 g/dL (ref 12.0–16.0)
MCH: 30.4 pg (ref 26.0–34.0)
MCHC: 34.1 g/dL (ref 32.0–36.0)
MCV: 89.3 fL (ref 80.0–100.0)
Platelets: 267 10*3/uL (ref 150–440)
RBC: 4.02 MIL/uL (ref 3.80–5.20)
RDW: 13.6 % (ref 11.5–14.5)
WBC: 7.7 10*3/uL (ref 3.6–11.0)

## 2016-11-30 LAB — TYPE AND SCREEN
ABO/RH(D): A POS
Antibody Screen: NEGATIVE

## 2016-11-30 LAB — COMPREHENSIVE METABOLIC PANEL
ALBUMIN: 4.2 g/dL (ref 3.5–5.0)
ALK PHOS: 56 U/L (ref 38–126)
ALT: 15 U/L (ref 14–54)
ANION GAP: 7 (ref 5–15)
AST: 20 U/L (ref 15–41)
BILIRUBIN TOTAL: 0.4 mg/dL (ref 0.3–1.2)
BUN: 7 mg/dL (ref 6–20)
CALCIUM: 9.4 mg/dL (ref 8.9–10.3)
CO2: 25 mmol/L (ref 22–32)
CREATININE: 0.76 mg/dL (ref 0.44–1.00)
Chloride: 107 mmol/L (ref 101–111)
GFR calc Af Amer: >60 mL/min (ref >60)
GFR calc non Af Amer: >60 mL/min (ref >60)
GLUCOSE: 95 mg/dL (ref 65–99)
Potassium: 4.2 mmol/L (ref 3.5–5.1)
Sodium: 139 mmol/L (ref 135–145)
Total Protein: 7.7 g/dL (ref 6.5–8.1)

## 2016-11-30 LAB — HCG, QUANTITATIVE, PREGNANCY: hCG, Beta Chain, Quant, S: 1730 m[IU]/mL — ABNORMAL HIGH (ref <5)

## 2016-11-30 SURGERY — SALPINGECTOMY, UNILATERAL, LAPAROSCOPIC
Anesthesia: General | Laterality: Right | Wound class: Clean Contaminated

## 2016-11-30 MED ORDER — MIDAZOLAM HCL 2 MG/2ML IJ SOLN
INTRAMUSCULAR | Status: DC | PRN
  Administered 2016-11-30: 2 mg via INTRAVENOUS

## 2016-11-30 MED ORDER — LIDOCAINE HCL (CARDIAC) 20 MG/ML IV SOLN
INTRAVENOUS | Status: DC | PRN
  Administered 2016-11-30: 60 mg via INTRAVENOUS

## 2016-11-30 MED ORDER — FENTANYL CITRATE (PF) 100 MCG/2ML IJ SOLN: 50.0000 ug | INTRAMUSCULAR | Status: DC | PRN

## 2016-11-30 MED ORDER — OXYCODONE-ACETAMINOPHEN 5-325 MG PO TABS
1.0000 | ORAL_TABLET | ORAL | Status: DC | PRN
  Administered 2016-12-01: 2 via ORAL
  Administered 2016-12-01: 1 via ORAL
  Administered 2016-12-01: 2 via ORAL
  Filled 2016-11-30 (×2): qty 2
  Filled 2016-11-30: qty 1

## 2016-11-30 MED ORDER — DOCUSATE SODIUM 100 MG PO CAPS
100.0000 mg | ORAL_CAPSULE | Freq: Two times a day (BID) | ORAL | Status: DC
  Administered 2016-12-01: 100 mg via ORAL
  Filled 2016-11-30: qty 1

## 2016-11-30 MED ORDER — SUGAMMADEX SODIUM 200 MG/2ML IV SOLN
INTRAVENOUS | Status: DC | PRN
  Administered 2016-11-30: 2 mg via INTRAVENOUS

## 2016-11-30 MED ORDER — MENTHOL 3 MG MT LOZG
1.0000 | LOZENGE | OROMUCOSAL | Status: DC | PRN
  Filled 2016-11-30: qty 9

## 2016-11-30 MED ORDER — IBUPROFEN 600 MG PO TABS
600.0000 mg | ORAL_TABLET | Freq: Four times a day (QID) | ORAL | Status: DC | PRN
  Administered 2016-12-01: 600 mg via ORAL
  Filled 2016-11-30: qty 1

## 2016-11-30 MED ORDER — FENTANYL CITRATE (PF) 100 MCG/2ML IJ SOLN
INTRAMUSCULAR | Status: AC
  Administered 2016-11-30: 25 ug via INTRAVENOUS
  Filled 2016-11-30: qty 2

## 2016-11-30 MED ORDER — SODIUM CHLORIDE 0.9 % IV BOLUS (SEPSIS)
500.0000 mL | Freq: Once | INTRAVENOUS | Status: AC
  Administered 2016-11-30: 500 mL via INTRAVENOUS

## 2016-11-30 MED ORDER — BUPIVACAINE HCL (PF) 0.5 % IJ SOLN
INTRAMUSCULAR | Status: DC | PRN
  Administered 2016-11-30: 6 mL

## 2016-11-30 MED ORDER — SIMETHICONE 80 MG PO CHEW
80.0000 mg | CHEWABLE_TABLET | Freq: Four times a day (QID) | ORAL | Status: DC | PRN
  Administered 2016-12-01: 80 mg via ORAL
  Filled 2016-11-30: qty 1

## 2016-11-30 MED ORDER — ROCURONIUM BROMIDE 100 MG/10ML IV SOLN
INTRAVENOUS | Status: DC | PRN
Start: 2016-11-30 — End: 2016-11-30
  Administered 2016-11-30: 10 mg via INTRAVENOUS
  Administered 2016-11-30: 40 mg via INTRAVENOUS

## 2016-11-30 MED ORDER — LACTATED RINGERS IV SOLN: INTRAVENOUS | Status: DC

## 2016-11-30 MED ORDER — SODIUM CHLORIDE 0.9 % IV SOLN
INTRAVENOUS | Status: DC | PRN
  Administered 2016-11-30: 21:00:00 via INTRAVENOUS

## 2016-11-30 MED ORDER — LACTATED RINGERS IV SOLN
INTRAVENOUS | Status: DC
  Administered 2016-11-30: 23:00:00 via INTRAVENOUS

## 2016-11-30 MED ORDER — BUPIVACAINE HCL (PF) 0.5 % IJ SOLN
INTRAMUSCULAR | Status: AC
  Filled 2016-11-30: qty 30

## 2016-11-30 MED ORDER — DEXAMETHASONE SODIUM PHOSPHATE 10 MG/ML IJ SOLN
INTRAMUSCULAR | Status: DC | PRN
  Administered 2016-11-30: 8 mg via INTRAVENOUS

## 2016-11-30 MED ORDER — ONDANSETRON HCL 4 MG/2ML IJ SOLN
INTRAMUSCULAR | Status: DC | PRN
  Administered 2016-11-30: 4 mg via INTRAVENOUS

## 2016-11-30 MED ORDER — FENTANYL CITRATE (PF) 100 MCG/2ML IJ SOLN
25.0000 ug | INTRAMUSCULAR | Status: DC | PRN
  Administered 2016-11-30 (×4): 25 ug via INTRAVENOUS

## 2016-11-30 MED ORDER — PROPOFOL 10 MG/ML IV BOLUS
INTRAVENOUS | Status: DC | PRN
  Administered 2016-11-30: 180 mg via INTRAVENOUS

## 2016-11-30 MED ORDER — ONDANSETRON HCL 4 MG/2ML IJ SOLN: 4.0000 mg | Freq: Once | INTRAMUSCULAR | Status: DC | PRN

## 2016-11-30 MED ORDER — FENTANYL CITRATE (PF) 100 MCG/2ML IJ SOLN
50.0000 ug | Freq: Once | INTRAMUSCULAR | Status: AC
  Administered 2016-11-30: 50 ug via INTRAVENOUS
  Filled 2016-11-30: qty 2

## 2016-11-30 MED ORDER — HYDROCODONE-ACETAMINOPHEN 5-325 MG PO TABS: 2.0000 | ORAL_TABLET | ORAL | Status: DC | PRN

## 2016-11-30 MED ORDER — ONDANSETRON HCL 4 MG PO TABS: 4.0000 mg | ORAL_TABLET | Freq: Four times a day (QID) | ORAL | Status: DC | PRN

## 2016-11-30 MED ORDER — ONDANSETRON HCL 4 MG/2ML IJ SOLN
4.0000 mg | Freq: Four times a day (QID) | INTRAMUSCULAR | Status: DC | PRN
  Administered 2016-12-01: 4 mg via INTRAVENOUS
  Filled 2016-11-30: qty 2

## 2016-11-30 MED ORDER — FENTANYL CITRATE (PF) 100 MCG/2ML IJ SOLN
50.0000 ug | INTRAMUSCULAR | Status: DC | PRN
  Administered 2016-11-30: 100 ug via INTRAVENOUS
  Administered 2016-11-30 (×4): 50 ug via INTRAVENOUS
  Filled 2016-11-30: qty 2

## 2016-11-30 MED ORDER — HYDROCODONE-ACETAMINOPHEN 5-325 MG PO TABS: 1.0000 | ORAL_TABLET | ORAL | Status: DC | PRN

## 2016-11-30 SURGICAL SUPPLY — 47 items
ANCHOR TIS RET SYS 235ML (MISCELLANEOUS) ×3 IMPLANT
BAG URINE DRAINAGE (UROLOGICAL SUPPLIES) ×3 IMPLANT
BAG URO DRAIN 2000ML W/SPOUT (MISCELLANEOUS) ×3 IMPLANT
BLADE SURG SZ11 CARB STEEL (BLADE) ×3 IMPLANT
CANISTER SUCT 1200ML W/VALVE (MISCELLANEOUS) ×3 IMPLANT
CATH FOL 2WAY LX 16X5 (CATHETERS) IMPLANT
CATH FOL LEG HOLDER (MISCELLANEOUS) ×3 IMPLANT
CATH FOLEY 2WAY  5CC 16FR (CATHETERS) ×2
CATH ROBINSON RED A/P 16FR (CATHETERS) ×3 IMPLANT
CATH URTH 16FR FL 2W BLN LF (CATHETERS) ×1 IMPLANT
CHLORAPREP W/TINT 26ML (MISCELLANEOUS) ×3 IMPLANT
DERMABOND ADVANCED (GAUZE/BANDAGES/DRESSINGS) ×2
DERMABOND ADVANCED .7 DNX12 (GAUZE/BANDAGES/DRESSINGS) ×1 IMPLANT
DRAPE LEGGINS SURG 28X43 STRL (DRAPES) ×3 IMPLANT
DRAPE UNDER BUTTOCK W/FLU (DRAPES) ×3 IMPLANT
GLOVE BIO SURGEON STRL SZ7 (GLOVE) ×3 IMPLANT
GLOVE BIOGEL PI IND STRL 7.5 (GLOVE) ×1 IMPLANT
GLOVE BIOGEL PI INDICATOR 7.5 (GLOVE) ×2
GOWN STRL REUS W/ TWL LRG LVL3 (GOWN DISPOSABLE) ×2 IMPLANT
GOWN STRL REUS W/TWL LRG LVL3 (GOWN DISPOSABLE) ×4
GRASPER SUT TROCAR 14GX15 (MISCELLANEOUS) ×3 IMPLANT
IV LACTATED RINGERS 1000ML (IV SOLUTION) ×3 IMPLANT
KIT RM TURNOVER CYSTO AR (KITS) ×3 IMPLANT
LABEL OR SOLS (LABEL) ×3 IMPLANT
NEEDLE HYPO 25GX1X1/2 BEV (NEEDLE) ×3 IMPLANT
NS IRRIG 500ML POUR BTL (IV SOLUTION) ×3 IMPLANT
PACK LAP CHOLECYSTECTOMY (MISCELLANEOUS) ×3 IMPLANT
PAD OB MATERNITY 4.3X12.25 (PERSONAL CARE ITEMS) ×3 IMPLANT
PAD PREP 24X41 OB/GYN DISP (PERSONAL CARE ITEMS) ×3 IMPLANT
POUCH ENDO CATCH 10MM SPEC (MISCELLANEOUS) ×3 IMPLANT
SCISSORS METZENBAUM CVD 33 (INSTRUMENTS) IMPLANT
SET SUCTION IRRIG HYDROSURG (IRRIGATION / IRRIGATOR) ×3 IMPLANT
SHEARS HARMONIC ACE PLUS 36CM (ENDOMECHANICALS) ×3 IMPLANT
SLEEVE ENDOPATH XCEL 5M (ENDOMECHANICALS) ×3 IMPLANT
SOL PREP PVP 2OZ (MISCELLANEOUS) ×3
SOLUTION PREP PVP 2OZ (MISCELLANEOUS) ×1 IMPLANT
SURGILUBE 2OZ TUBE FLIPTOP (MISCELLANEOUS) ×3 IMPLANT
SUT MNCRL 3-0 UNDYED SH (SUTURE) ×1 IMPLANT
SUT MONOCRYL 3-0 UNDYED (SUTURE) ×2
SUT VIC AB 0 CT1 36 (SUTURE) ×3 IMPLANT
SUT VIC AB 2-0 UR6 27 (SUTURE) IMPLANT
SUT VIC AB 4-0 PS2 18 (SUTURE) IMPLANT
SYR 50ML LL SCALE MARK (SYRINGE) ×3 IMPLANT
TROCAR 5M 150ML BLDLS (TROCAR) ×3 IMPLANT
TROCAR ENDO BLADELESS 11MM (ENDOMECHANICALS) ×3 IMPLANT
TROCAR XCEL NON-BLD 5MMX100MML (ENDOMECHANICALS) ×3 IMPLANT
TUBING INSUFFLATOR HI FLOW (MISCELLANEOUS) ×3 IMPLANT

## 2016-11-30 NOTE — Transfer of Care (Signed)
Immediate Anesthesia Transfer of Care Note  Patient: Claire Frederick [name]  Procedure(s) Performed: Procedure(s): LAPAROSCOPIC UNILATERAL SALPINGECTOMY WITH ECTOPIC (Right)  Patient Location: PACU  Anesthesia Type:General  Level of Consciousness: awake  Airway & Oxygen Therapy: Patient Spontanous Breathing  Post-op Assessment: Report given to RN and Post -op Vital signs reviewed and stable  Post vital signs: Reviewed and stable  Last Vitals:  Vitals:   11/30/16 1930 11/30/16 2202  BP: 111/69 136/69  Pulse: 90 (!) 108  Resp: 17 19  Temp:  36.2 C    Last Pain:  Vitals:   11/30/16 1858  PainSc: 3          Complications: No apparent anesthesia complications

## 2016-11-30 NOTE — ED Provider Notes (Addendum)
----------------------------------------- 1:29 PM on 11/30/2016 -----------------------------------------  Dr. Jean Rosenthal asks for [place], agrees w/ mgt and will come see pt.  [facility] Emergency Department Provider Note  ____________________________________________   I have reviewed the triage vital signs and the nursing notes.   HISTORY  Chief Complaint Vaginal Bleeding and Miscarriage    HPI Claire Frederick is a 26 y.o. female who presents today complaining of ongoing right-sided lower abdominal/pelvic pain. Patient has had the symptoms's for some time. Please see prior notes. She was actually admitted and discharged on January 7 with an observational stay and a repeat Quant that showed that her Sharene Butters was turning from 1700 down to 1200. At that time, it was thought that she likely had a completed miscarriage. There was however note made of a right-sided adnexal mass. Given the antecedent ultrasound that showed a possible gestational sac without yolk, and her down trending Quant, taken in the context of the very rare condition of heterotrophic pregnancy, made OB feel comfortable sending her home cording to notes. The patient states that since that time, she has had increased pain and she is having ongoing bouts of bleeding which varies in intensity. At this moment is not very strong but it has been in the past. Not using more than a pad an hour certainly today     Past Medical History:  Diagnosis Date  . Endometriosis     Patient Active Problem List   Diagnosis Date Noted  . Ectopic pregnancy 11/24/2016    No past surgical history on file.  Prior to Admission medications   Medication Sig Start Date End Date Taking? Authorizing Provider  acetaminophen (TYLENOL) 325 MG tablet Take 325 mg by mouth every 6 (six) hours as needed.    Historical Provider, MD  ibuprofen (ADVIL,MOTRIN) 600 MG tablet Take 1 tablet (600 mg total) by mouth every 6 (six) hours as  needed. 11/24/16   Vena Austria, MD    Allergies Patient has no known allergies.  No family history on file.  Social History Social History  Substance Use Topics  . Smoking status: Current Every Day Smoker    Packs/day: 0.50    Types: Cigarettes  . Smokeless tobacco: Never Used  . Alcohol use Yes     Comment: socially    Review of Systems Constitutional: No fever/chills Eyes: No visual changes. ENT: No sore throat. No stiff neck no neck pain Cardiovascular: Denies chest pain. Respiratory: Denies shortness of breath. Gastrointestinal:   no vomiting.  No diarrhea.  No constipation. Genitourinary: Negative for dysuria. Musculoskeletal: Negative lower extremity swelling Skin: Negative for rash. Neurological: Negative for severe headaches, focal weakness or numbness. 10-point ROS otherwise negative.  ____________________________________________   PHYSICAL EXAM:  VITAL SIGNS: ED Triage Vitals [11/30/16 1125]  Enc Vitals Group     BP 124/73     Pulse Rate 95     Resp 16     Temp 98 F (36.7 C)     Temp src      SpO2 99 %     Weight 215 lb (97.5 kg)     Height 5\' 4"  (1.626 m)     Head Circumference      Peak Flow      Pain Score 7     Pain Loc      Pain Edu?      Excl. in GC?     Constitutional: Alert and oriented. Well appearing and in no acute distress. Eyes: Conjunctivae are normal.  PERRL. EOMI. Head: Atraumatic. Nose: No congestion/rhinnorhea. Mouth/Throat: Mucous membranes are moist.  Oropharynx non-erythematous. Neck: No stridor.   Nontender with no meningismus Cardiovascular: Normal rate, regular rhythm. Grossly normal heart sounds.  Good peripheral circulation. Respiratory: Normal respiratory effort.  No retractions. Lungs CTAB. Abdominal: Soft and Tender to palpation in the right lower quadrant nonsurgical abdomen no evidence of peritoneal signs. No distention. No guarding no rebound Back:  There is no focal tenderness or step off.  there is no  midline tenderness there are no lesions noted. there is no CVA tenderness Musculoskeletal: No lower extremity tenderness, no upper extremity tenderness. No joint effusions, no DVT signs strong distal pulses no edema Neurologic:  Normal speech and language. No gross focal neurologic deficits are appreciated.  Skin:  Skin is warm, dry and intact. No rash noted. Psychiatric: Mood and affect are normal. Speech and behavior are normal.  ____________________________________________   LABS (all labs ordered are listed, but only abnormal results are displayed)  Labs Reviewed  HCG, QUANTITATIVE, PREGNANCY - Abnormal; Notable for the following:       Result Value   hCG, Beta Chain, Quant, S 1,730 (*)    All other components within normal limits  CBC  COMPREHENSIVE METABOLIC PANEL  TYPE AND SCREEN   ____________________________________________  EKG  I personally interpreted any EKGs ordered by me or triage  ____________________________________________  RADIOLOGY  I reviewed any imaging ordered by me or triage that were performed during my shift and, if possible, patient and/or family made aware of any abnormal findings. ____________________________________________   PROCEDURES  Procedure(s) performed: None  Procedures  Critical Care performed: None  ____________________________________________   INITIAL IMPRESSION / ASSESSMENT AND PLAN / ED COURSE  Pertinent labs & imaging results that were available during my care of the patient were reviewed by me and considered in my medical decision making (see chart for details).  Patient with a known adnexal mass I suspect that the initial positive ultrasound for intrauterine pregnancy was most likely a pseudocyst, I do feel patient most likely has an ectopic pregnancy. I did discuss with Dr. Jean RosenthalJackson, see above, he would like to repeat ultrasound he'll come evaluate the patient. She has been type and screen. She is hemodynamically stable  at this time. I will see if I can expedite ultrasound and we will continue to observe her we'll place I.V  ----------------------------------------- 3:22 PM on 11/30/2016 -----------------------------------------  US noted. I feel this is an ectopic. hgb stable 2nd iv ordered, pt awaiting OB signed out at the end of my shift to Dr. Langston MaskerShaevitz.  Clinical Course    ____________________________________________   FINAL CLINICAL IMPRESSION(S) / ED DIAGNOSES  Final diagnoses:  None      This chart was dictated using voice recognition software.  Despite best efforts to proofread,  errors can occur which can change meaning.      Jeanmarie PlantJames A McShane, MD 11/30/16 1341    Jeanmarie PlantJames A McShane, MD 11/30/16 [phone]

## 2016-11-30 NOTE — Anesthesia Preprocedure Evaluation (Signed)
Anesthesia Evaluation  Patient identified by MRN, date of birth, ID band Patient awake    Reviewed: Allergy & Precautions, NPO status , Patient's Chart, lab work & pertinent test results  History of Anesthesia Complications Negative for: history of anesthetic complications  Airway Mallampati: II       Dental   Pulmonary asthma (no inhalers for years) , Current Smoker,           Cardiovascular negative cardio ROS       Neuro/Psych negative neurological ROS     GI/Hepatic Neg liver ROS, GERD  ,  Endo/Other  Hypothyroidism (off meds now)   Renal/GU negative Renal ROS     Musculoskeletal   Abdominal   Peds  Hematology negative hematology ROS (+)   Anesthesia Other Findings   Reproductive/Obstetrics                             Anesthesia Physical Anesthesia Plan  ASA: II and emergent  Anesthesia Plan: General   Post-op Pain Management:    Induction: Intravenous  Airway Management Planned: Oral ETT  Additional Equipment:   Intra-op Plan:   Post-operative Plan:   Informed Consent: I have reviewed the patients History and Physical, chart, labs and discussed the procedure including the risks, benefits and alternatives for the proposed anesthesia with the patient or authorized representative who has indicated his/her understanding and acceptance.     Plan Discussed with:   Anesthesia Plan Comments:         Anesthesia Quick Evaluation

## 2016-11-30 NOTE — Anesthesia Procedure Notes (Signed)
Procedure Name: Intubation Date/Time: 11/30/2016 8:48 PM Performed by: Allean Found Pre-anesthesia Checklist: Patient identified, Emergency Drugs available, Suction available, Patient being monitored and Timeout performed Patient Re-evaluated:Patient Re-evaluated prior to inductionOxygen Delivery Method: Circle system utilized Preoxygenation: Pre-oxygenation with 100% oxygen Intubation Type: IV induction Ventilation: Mask ventilation without difficulty Laryngoscope Size: Mac and 3 Grade View: Grade I Tube type: Oral Tube size: 7.0 mm Number of attempts: 1 Airway Equipment and Method: Stylet Placement Confirmation: ETT inserted through vocal cords under direct vision,  positive ETCO2 and breath sounds checked- equal and bilateral Secured at: 22 cm Tube secured with: Tape Dental Injury: Teeth and Oropharynx as per pre-operative assessment

## 2016-11-30 NOTE — ED Triage Notes (Signed)
Had a miscarriage last week and at time was told had a "mass on right ovary". Does not have insurance for follow up and states pain worse today.

## 2016-11-30 NOTE — Op Note (Signed)
  Operative Note    Pre-Op Diagnosis: right ectopic pregnancy  Post-Op Diagnosis: right ectopic pregnancy  Procedures: Laparoscopic right salpingectomy, removal of ectopic pregnancy  Primary Surgeon: Thomasene MohairStephen Braylinn Gulden, MD  EBL: 50 mL   IVF: 700 mL crysalloid  Urine output: 150 mL  Specimens: right fallopian tube with ectopic pregnancy  Drains: none  Complications: None   Disposition: PACU   Condition: Stable   Findings:  1) enlarged right fallopian tube, consistent with tubal ectopic pregnancy 2) normal-appearing right ovary 3) normal appearing uterus, left ovary and fallopian tube  Procedure Summary:  The patient was taken to the operating room where general anesthesia was administered and found to be adequate. She was placed in the dorsal supine lithotomy position in [place] stirrups and prepped and draped in usual sterile fashion with great care taken to position patient to minimize risk of nerve damage. After a timeout was called an indwelling catheter was placed in her bladder. A sterile speculum was placed in the vagina and Hulka tenaculum was used to grasp the anterior lip of the cervix. The speculum was removed from the vagina.  Attention was turned to the abdomen where after injection of local anesthetic, a 5 mm infraumbilical incision was made with the scalpel. Entry into the abdomen was obtained via Optiview trocar technique (a blunt entry technique with camera visualization through the obturator upon entry). Verification of entry into the abdomen was obtained using opening pressures. The abdomen was insufflated with CO2. The camera was introduced through the trocar with verification of atraumatic entry.   A 5 mm suprapubic port and 11 mm left lower quadrant port were placed under direct intra-abdominal camera visualization without difficulty. A survey of the pelvis was undertaken with the above-noted findings. The fimbriated end of the right fallopian tube was elevated  and using the Harmonic scalpel the mesosalpinx was divided from the lateral and toward the medial and with complete removal of the right fallopian tube. Hemostasis was noted. The right fallopian tube and ectopic pregnancy were placed in a pouch through the left lower quadrant trocar and removed intact in the pouch through the trocar. Copious irrigation was undertaken with suction to remove the irrigant. With continued hemostasis noted at the site of the removal of the fallopian tube, this terminated the procedure. The left lower quadrant port was removed and using a fascial closure device a 0 Vicryl single interrupted stitch was placed to reapproximate the fascia. The abdomen was desufflated of CO2 with 5 deep breaths from anesthesia. The trochars were removed. The left lower quadrant port site skin was reapproximated using 4-0 Monocryl. All port site skin closures were accomplished using surgical skin glue.  A speculum was placed in the vagina and the Hulka tenaculum was removed from the cervix. Hemostasis was noted at the tenaculum entry site on the cervix. The speculum was removed. The Foley catheter was removed.  The patient tolerated the procedure well.  Sponge, lap, needle, and instrument counts were correct x 2.  VTE prophylaxis: SCDs. Antibiotic prophylaxis: none indicated and none given. She was awakened in the operating room and was taken to the PACU in stable condition.   Thomasene MohairStephen Darvin Dials, MD 11/30/2016 9:51 PM
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
## 2016-12-01 LAB — HCG, QUANTITATIVE, PREGNANCY: HCG, BETA CHAIN, QUANT, S: 538 m[IU]/mL — AB (ref <5)

## 2016-12-01 MED ORDER — INFLUENZA VAC SPLIT QUAD 0.5 ML IM SUSY
0.5000 mL | PREFILLED_SYRINGE | INTRAMUSCULAR | Status: AC
  Administered 2016-12-01: 0.5 mL via INTRAMUSCULAR
  Filled 2016-12-01: qty 0.5

## 2016-12-01 MED ORDER — HYDROCODONE-ACETAMINOPHEN 5-325 MG PO TABS: 2.0000 | ORAL_TABLET | ORAL | 0 refills | Status: DC | PRN

## 2016-12-01 MED ORDER — IBUPROFEN 600 MG PO TABS: 600.0000 mg | ORAL_TABLET | Freq: Four times a day (QID) | ORAL | 0 refills | Status: DC | PRN

## 2016-12-01 NOTE — Clinical Social Work Note (Signed)
CSW received consult for assistance with Medicaid. As the patient will dc today, CSW advised the RN to inform the patient to visit the Department of Social Services as no financial counselors are available on the weekend. CSW signing off.  Argentina PonderKaren Martha Christ Fullenwider, MSW, Theresia MajorsLCSWA [phone]

## 2016-12-01 NOTE — Discharge Summary (Signed)
DC Summary Discharge Summary   Patient ID: Claire Frederick [id number] 26 y.o. 05-19-1991  Admit date: 11/30/2016  Discharge date: 12/01/2016  Principal Diagnoses:  Right tubal ectopic pregnancy  Secondary Diagnoses:  None  Procedures performed during the hospitalization:  Laparoscopic right salpingectomy, removal of ectopic pregnancy  HPI: The patient presents for 2 days of right lower quadrant abdominal pain that started yesterday morning. The pain is described as sharp and stabbing and does not radiate. The pain is rated as severe by the patient. Nothing makes the pain better. Certain movements and applying pressure to her lower belly make the pain worse. She associates nausea with emesis with this pain. She has also had some symptoms like chills. She denies fevers. Her last menstrual period was mid November making her approximately [redacted] weeks pregnant. She presented in late December with vaginal bleeding and pain and was diagnosed with an likely miscarriage. She presented again last week with an increasing hCG level and had a noted 3 cm lesion cephalad to her right ovary. She was admitted and observed overnight and had an appropriate drop in her hCG level. When she presented in late December there was what appeared to be a gestational sac in her uterus which was not present at her last admission last week. With her presentation today she notes continued vaginal bleeding, at times heavy. She notes a history of chlamydia, which was treated and has had multiple negative tests since that time. She denies a history of PID. She denies a history of prior ectopic pregnancies. She does state that she was told by a gynecologist that she might have endometriosis and it was recommended to her that she undergo diagnostic laparoscopy. She has never undergone the surgery. At the time of conception with this pregnancy she was taking oral contraceptive pills. Her hCG level was about 300 at initial presentation  in late December. It rose to about 1700 last week and after observation the level dropped to about 1200. She presents today with an hCG level of about 1700.  Past Medical History:  Diagnosis Date  . Ectopic pregnancy 11/24/2016  . Endometriosis     Past Surgical History:  Procedure Laterality Date  . NO PAST SURGERIES      No Known Allergies  Social History  Substance Use Topics  . Smoking status: Current Every Day Smoker    Packs/day: 0.50    Types: Cigarettes  . Smokeless tobacco: Never Used  . Alcohol use Yes     Comment: socially    Family History  Problem Relation Age of Onset  . Drug abuse Mother   . Diabetes Father   . Hypercholesterolemia Father     Hospital Course:  Admitted for the above and went to OR on 11/30/16 for laparoscopic right salpingectomy with removal of right ectopic pregnancy, which occurred without incident.  She was monitored overnight as she got out of surgery late.  By the time of discharge she was ambulating, tolerating PO, pain well-controlled on PO pain meds, and was voiding spontaneously.  Her vitals were stable. She was deemed to be an appropriate candidate for discharge.   Discharge Exam: BP 112/74 (BP Location: Right Arm)   Pulse 64   Temp 97.4 F (36.3 C) (Oral)   Resp 18   Ht 5\' 4"  (1.626 m)   Wt 215 lb (97.5 kg)   LMP 10/07/2016 (Approximate) Comment: miscarriage - bleeding x 4 weeks  SpO2 100%   BMI 36.90 kg/m  General  no  apparent distress   CV  RRR   Pulmonary  clear to ausculatation bllaterally   Abdomen  Bowel sounds: present, soft, appropriately ttp  Incisions: clean, dry, intact   Extremities  no edema, symmetric, SCDs in place    Condition at Discharge: Stable  Complications affecting treatment: None  Discharge Medications:  Allergies as of 12/01/2016   No Known Allergies     Medication List    STOP taking these medications   levothyroxine 50 MCG tablet Commonly known as:  SYNTHROID, LEVOTHROID     TAKE  these medications   acetaminophen 325 MG tablet Commonly known as:  TYLENOL Take 325 mg by mouth every 6 (six) hours as needed.   fluticasone 50 MCG/ACT nasal spray Commonly known as:  FLONASE Place 1 spray into both nostrils daily.   HYDROcodone-acetaminophen 5-325 MG tablet Commonly known as:  NORCO/VICODIN Take 2 tablets by mouth every 4 (four) hours as needed for severe pain (pain score >7/10).   ibuprofen 600 MG tablet Commonly known as:  ADVIL,MOTRIN Take 1 tablet (600 mg total) by mouth every 6 (six) hours as needed (mild pain). What changed:  reasons to take this       Follow-up arrangements:  Follow-up Information    Conard Novak, MD Follow up in 2 week(s).   Specialty:  Obstetrics and Gynecology Why:  post op appointment Contact information: [address] [place] [address] [phone]           Discharge Disposition: Home to self care.  Signed: Conard Novak, MD 12/01/2016 9:48 AM

## 2016-12-01 NOTE — Progress Notes (Signed)
Patient understands all discharge instructions and the need to make follow up appointments. Patient discharge via wheelchair with nursing students.

## 2016-12-01 NOTE — Anesthesia Postprocedure Evaluation (Signed)
Anesthesia Post Note  Patient: Claire Frederick  Procedure(s) Performed: Procedure(s) (LRB): LAPAROSCOPIC UNILATERAL SALPINGECTOMY WITH ECTOPIC (Right)  Patient location during evaluation: PACU Anesthesia Type: General Level of consciousness: awake and alert Pain management: pain level controlled Vital Signs Assessment: post-procedure vital signs reviewed and stable Respiratory status: spontaneous breathing and respiratory function stable Cardiovascular status: stable Anesthetic complications: no     Last Vitals:  Vitals:   12/01/16 0120 12/01/16 0302  BP: 125/64 123/61  Pulse: 78 80  Resp: 19 18  Temp: 36.8 C 36.4 C    Last Pain:  Vitals:   12/01/16 0717  TempSrc:   PainSc: Asleep                 KEPHART,WILLIAM K

## 2016-12-02 ENCOUNTER — Encounter: Payer: Self-pay | Admitting: Obstetrics and Gynecology

## 2016-12-03 ENCOUNTER — Encounter: Payer: Self-pay | Admitting: Obstetrics and Gynecology

## 2016-12-03 LAB — SURGICAL PATHOLOGY

## 2016-12-12 ENCOUNTER — Encounter: Payer: Self-pay | Admitting: Obstetrics and Gynecology

## 2017-01-14 ENCOUNTER — Inpatient Hospital Stay (HOSPITAL_COMMUNITY)
Admission: EM | Admit: 2017-01-14 | Discharge: 2017-01-19 | DRG: 885 | Disposition: A | Discharge status: Home / Self Care | Payer: No Typology Code available for payment source | Source: Intra-hospital | Attending: Psychiatry | Admitting: Psychiatry

## 2017-01-14 ENCOUNTER — Encounter (HOSPITAL_COMMUNITY): Payer: Self-pay

## 2017-01-14 DIAGNOSIS — Z62810 Personal history of physical and sexual abuse in childhood: Secondary | ICD-10-CM | POA: Diagnosis present

## 2017-01-14 DIAGNOSIS — Z818 Family history of other mental and behavioral disorders: Secondary | ICD-10-CM

## 2017-01-14 DIAGNOSIS — Z915 Personal history of self-harm: Secondary | ICD-10-CM

## 2017-01-14 DIAGNOSIS — Z813 Family history of other psychoactive substance abuse and dependence: Secondary | ICD-10-CM | POA: Diagnosis not present

## 2017-01-14 DIAGNOSIS — Z79899 Other long term (current) drug therapy: Secondary | ICD-10-CM | POA: Diagnosis not present

## 2017-01-14 DIAGNOSIS — Z634 Disappearance and death of family member: Secondary | ICD-10-CM

## 2017-01-14 DIAGNOSIS — F419 Anxiety disorder, unspecified: Secondary | ICD-10-CM | POA: Diagnosis present

## 2017-01-14 DIAGNOSIS — R45851 Suicidal ideations: Secondary | ICD-10-CM | POA: Diagnosis present

## 2017-01-14 DIAGNOSIS — N809 Endometriosis, unspecified: Secondary | ICD-10-CM | POA: Diagnosis present

## 2017-01-14 DIAGNOSIS — Z814 Family history of other substance abuse and dependence: Secondary | ICD-10-CM | POA: Diagnosis not present

## 2017-01-14 DIAGNOSIS — Z833 Family history of diabetes mellitus: Secondary | ICD-10-CM

## 2017-01-14 DIAGNOSIS — Z793 Long term (current) use of hormonal contraceptives: Secondary | ICD-10-CM

## 2017-01-14 DIAGNOSIS — E039 Hypothyroidism, unspecified: Secondary | ICD-10-CM | POA: Diagnosis present

## 2017-01-14 DIAGNOSIS — G4700 Insomnia, unspecified: Secondary | ICD-10-CM | POA: Diagnosis present

## 2017-01-14 DIAGNOSIS — Z8342 Family history of familial hypercholesterolemia: Secondary | ICD-10-CM

## 2017-01-14 DIAGNOSIS — F17210 Nicotine dependence, cigarettes, uncomplicated: Secondary | ICD-10-CM | POA: Diagnosis present

## 2017-01-14 DIAGNOSIS — Z6372 Alcoholism and drug addiction in family: Secondary | ICD-10-CM | POA: Diagnosis not present

## 2017-01-14 DIAGNOSIS — F3181 Bipolar II disorder: Secondary | ICD-10-CM | POA: Diagnosis not present

## 2017-01-14 DIAGNOSIS — Z639 Problem related to primary support group, unspecified: Secondary | ICD-10-CM | POA: Diagnosis not present

## 2017-01-14 HISTORY — DX: Bipolar disorder, unspecified: F31.9

## 2017-01-14 HISTORY — DX: Anxiety disorder, unspecified: F41.9

## 2017-01-14 HISTORY — DX: Major depressive disorder, single episode, unspecified: F32.9

## 2017-01-14 HISTORY — DX: Hypothyroidism, unspecified: E03.9

## 2017-01-14 HISTORY — DX: Depression, unspecified: F32.A

## 2017-01-14 MED ORDER — IBUPROFEN 600 MG PO TABS
600.0000 mg | ORAL_TABLET | Freq: Four times a day (QID) | ORAL | Status: DC | PRN
  Administered 2017-01-18: 600 mg via ORAL
  Filled 2017-01-14 (×2): qty 1

## 2017-01-14 MED ORDER — ALUM & MAG HYDROXIDE-SIMETH 200-200-20 MG/5ML PO SUSP
30.0000 mL | ORAL | Status: DC | PRN
  Administered 2017-01-16: 30 mL via ORAL
  Filled 2017-01-14: qty 30

## 2017-01-14 MED ORDER — TRAZODONE HCL 50 MG PO TABS
50.0000 mg | ORAL_TABLET | Freq: Every evening | ORAL | Status: DC | PRN
  Administered 2017-01-14: 50 mg via ORAL
  Filled 2017-01-14 (×7): qty 1

## 2017-01-14 MED ORDER — GABAPENTIN 300 MG PO CAPS
300.0000 mg | ORAL_CAPSULE | Freq: Three times a day (TID) | ORAL | Status: DC
  Administered 2017-01-15 – 2017-01-19 (×13): 300 mg via ORAL
  Filled 2017-01-14 (×9): qty 1
  Filled 2017-01-14: qty 21
  Filled 2017-01-14: qty 1
  Filled 2017-01-14 (×2): qty 21
  Filled 2017-01-14 (×7): qty 1

## 2017-01-14 MED ORDER — HYDROXYZINE HCL 25 MG PO TABS
25.0000 mg | ORAL_TABLET | Freq: Four times a day (QID) | ORAL | Status: DC | PRN
  Administered 2017-01-14 – 2017-01-19 (×3): 25 mg via ORAL
  Filled 2017-01-14: qty 10
  Filled 2017-01-14 (×4): qty 1

## 2017-01-14 MED ORDER — NICOTINE 21 MG/24HR TD PT24
MEDICATED_PATCH | TRANSDERMAL | Status: AC
  Administered 2017-01-14: 21 mg
  Filled 2017-01-14: qty 1

## 2017-01-14 MED ORDER — LURASIDONE HCL 20 MG PO TABS
20.0000 mg | ORAL_TABLET | Freq: Every day | ORAL | Status: DC
  Administered 2017-01-15: 20 mg via ORAL
  Filled 2017-01-14 (×2): qty 1

## 2017-01-14 MED ORDER — LORATADINE 10 MG PO TABS
10.0000 mg | ORAL_TABLET | Freq: Every day | ORAL | Status: DC
  Administered 2017-01-15 – 2017-01-19 (×5): 10 mg via ORAL
  Filled 2017-01-14 (×8): qty 1

## 2017-01-14 MED ORDER — NICOTINE 21 MG/24HR TD PT24
21.0000 mg | MEDICATED_PATCH | Freq: Every day | TRANSDERMAL | Status: DC
  Administered 2017-01-14: 21 mg via TRANSDERMAL
  Filled 2017-01-14 (×8): qty 1

## 2017-01-14 MED ORDER — ACETAMINOPHEN 325 MG PO TABS
650.0000 mg | ORAL_TABLET | Freq: Four times a day (QID) | ORAL | Status: DC | PRN
  Administered 2017-01-17: 650 mg via ORAL
  Filled 2017-01-14: qty 2

## 2017-01-14 MED ORDER — MAGNESIUM HYDROXIDE 400 MG/5ML PO SUSP
30.0000 mL | Freq: Every day | ORAL | Status: DC | PRN
  Administered 2017-01-14: 30 mL via ORAL
  Filled 2017-01-14: qty 30
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
## 2017-01-14 NOTE — Progress Notes (Signed)
Pt acclimating herself to the unit. Talking with peers and in a brighter mood then when she was admitted. Went outside with the peers without incident. Verbalized no complaints.
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
## 2017-01-14 NOTE — [facility] Group Notes (Signed)
[place] LCSW Group Therapy  01/14/2017 3:06 PM  Type of Therapy:  Group Therapy  Participation Level:  Did Not Attend-pt met with CSW and chose to remain in bed. New admit.   Summary of Progress/Problems: [facility]HA Speaker came to talk about his personal journey with substance abuse and addiction. The pt processed ways by which to relate to the speaker. [place] speaker provided handouts and educational information pertaining to groups and services offered by the [facility].   Dishon Kehoe N Smart LCSW 01/14/2017, 3:06 PM
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
## 2017-01-14 NOTE — Progress Notes (Signed)
Patient ID: Claire Frederick, female   DOB: 1991/10/18, 26 y.o.   MRN: [id number] D: Claire Frederick reports "just need something for anxiety and sleep" anxiety "8" of 10. "I was taking Klonopin" A: Clinical research associateWriter provided emotional support, staffed with Dianna LimboS. Simon, PA orders received (see MAR) Medications reviewed, administered as ordered. Staff will monitor q5415min for safety. R: Claire Frederick is safe on the unit.

## 2017-01-15 DIAGNOSIS — F3181 Bipolar II disorder: Principal | ICD-10-CM

## 2017-01-15 DIAGNOSIS — Z79899 Other long term (current) drug therapy: Secondary | ICD-10-CM

## 2017-01-15 DIAGNOSIS — Z813 Family history of other psychoactive substance abuse and dependence: Secondary | ICD-10-CM

## 2017-01-15 LAB — LIPID PANEL
Cholesterol: 160 mg/dL (ref 0–200)
HDL: 56 mg/dL (ref >40)
LDL CALC: 82 mg/dL (ref 0–99)
Total CHOL/HDL Ratio: 2.9 RATIO
Triglycerides: 112 mg/dL (ref <150)
VLDL: 22 mg/dL (ref 0–40)

## 2017-01-15 LAB — TSH: TSH: 2.1 u[IU]/mL (ref 0.350–4.500)

## 2017-01-15 MED ORDER — ARIPIPRAZOLE 5 MG PO TABS
5.0000 mg | ORAL_TABLET | Freq: Every day | ORAL | Status: DC
  Administered 2017-01-15 – 2017-01-19 (×5): 5 mg via ORAL
  Filled 2017-01-15 (×2): qty 1
  Filled 2017-01-15: qty 7
  Filled 2017-01-15 (×5): qty 1

## 2017-01-15 MED ORDER — TRAZODONE HCL 100 MG PO TABS
100.0000 mg | ORAL_TABLET | Freq: Every day | ORAL | Status: DC
  Administered 2017-01-15 – 2017-01-17 (×3): 100 mg via ORAL
  Filled 2017-01-15 (×5): qty 1
  Filled 2017-01-15: qty 7

## 2017-01-15 NOTE — Progress Notes (Signed)
Recreation Therapy Notes  Date: 01/15/17 Time: 0930 Location: [address] Group Room  Group Topic: Stress Management  Goal Area(s) Addresses:  Patient will verbalize importance of using healthy stress management.  Patient will identify positive emotions associated with healthy stress management.   Intervention: Stress Management  Activity :  Meditation.  LRT played a meditation from the Calm App so patients could practice the technique of meditation.  Patients were to follow along as the meditation was played to engage in the activity.  Education:  Stress Management, Discharge Planning.   Education Outcome: Acknowledges edcuation/In group clarification offered/Needs additional education  Clinical Observations/Feedback: Pt did not attend group.    Caroll RancherMarjette Abdurrahman [place], LRT/CTRS         Caroll RancherLindsay, Miria Cappelli A 01/15/2017 12:42 PM

## 2017-01-15 NOTE — Plan of Care (Signed)
Problem: Safety: Goal: Periods of time without injury will increase Outcome: Progressing Client has remained without injury since this admisssion AEB q3915min safety checks and medication review.

## 2017-01-15 NOTE — Tx Team (Signed)
Initial Treatment Plan 01/15/2017 2:31 PM Claire Cari CarawayLloyd Frederick XLK:[id number]    PATIENT STRESSORS: Marital or family conflict Occupational concerns Traumatic event   PATIENT STRENGTHS: Ability for insight Supportive family/friends   PATIENT IDENTIFIED PROBLEMS: Depression  Anti-social behaviors  "I want to have better relationships"  "I want to like myself"               DISCHARGE CRITERIA:  Ability to meet basic life and health needs Adequate post-discharge living arrangements Improved stabilization in mood, thinking, and/or behavior  PRELIMINARY DISCHARGE PLAN: Participate in family therapy Placement in alternative living arrangements Return to previous work or school arrangements  PATIENT/FAMILY INVOLVEMENT: This treatment plan has been presented to and reviewed with the patient, Claire Frederick, and/or family member.  The patient and family have been given the opportunity to ask questions and make suggestions.  Altamese Cabalheresa A Yanil Dawe, RN 01/15/2017, 2:31 PM

## 2017-01-15 NOTE — Tx Team (Signed)
Interdisciplinary Treatment and Diagnostic Plan Update  01/16/2017 Time of Session: 0930 Claire Frederick Claire Frederick MRN: [id number]  Principal Diagnosis: Bipolar 2 disorder, major depressive episode (HCC)  Secondary Diagnoses: Principal Problem:   Bipolar 2 disorder, major depressive episode (HCC)   Current Medications:  Current Facility-Administered Medications  Medication Dose Route Frequency Provider Last Rate Last Dose  . acetaminophen (TYLENOL) tablet 650 mg  650 mg Oral Q6H PRN Kerry HoughSpencer E Simon, PA-C      . alum & mag hydroxide-simeth (MAALOX/MYLANTA) 200-200-20 MG/5ML suspension 30 mL  30 mL Oral Q4H PRN Kerry HoughSpencer E Simon, PA-C      . ARIPiprazole (ABILIFY) tablet 5 mg  5 mg Oral Daily Georgiann CockerVincent A Izediuno, MD   5 mg at 01/16/17 0820  . gabapentin (NEURONTIN) capsule 300 mg  300 mg Oral TID Kerry HoughSpencer E Simon, PA-C   300 mg at 01/16/17 0820  . hydrOXYzine (ATARAX/VISTARIL) tablet 25 mg  25 mg Oral Q6H PRN Kerry HoughSpencer E Simon, PA-C   25 mg at 01/14/17 2204  . ibuprofen (ADVIL,MOTRIN) tablet 600 mg  600 mg Oral Q6H PRN Kerry HoughSpencer E Simon, PA-C      . loratadine (CLARITIN) tablet 10 mg  10 mg Oral Daily Kerry HoughSpencer E Simon, PA-C   10 mg at 01/16/17 [id number]  . magnesium hydroxide (MILK OF MAGNESIA) suspension 30 mL  30 mL Oral Daily PRN Kerry HoughSpencer E Simon, PA-C   30 mL at 01/14/17 2204  . nicotine (NICODERM CQ - dosed in mg/24 hours) patch 21 mg  21 mg Transdermal Daily Adonis BrookSheila Agustin, NP   21 mg at 01/14/17 1457  . traZODone (DESYREL) tablet 100 mg  100 mg Oral QHS Sanjuana KavaAgnes I Nwoko, NP   100 mg at 01/15/17 2200   PTA Medications: Prescriptions Prior to Admission  Medication Sig Dispense Refill Last Dose  . acetaminophen (TYLENOL) 325 MG tablet Take 325 mg by mouth every 6 (six) hours as needed for moderate pain.    Past Month at Unknown time  . clonazePAM (KLONOPIN) 1 MG tablet Take 1 mg by mouth daily as needed for anxiety.   01/13/2017 at 0900  . ibuprofen (ADVIL,MOTRIN) 200 MG tablet Take 400 mg by mouth every 6  (six) hours as needed for moderate pain.   Past Week at Unknown time  . loratadine (CLARITIN) 10 MG tablet Take 10 mg by mouth daily.   01/12/2017  . HYDROcodone-acetaminophen (NORCO/VICODIN) 5-325 MG tablet Take 2 tablets by mouth every 4 (four) hours as needed for severe pain (pain score >7/10). (Patient not taking: Reported on 01/14/2017) 30 tablet 0 Not Taking at Unknown time  . ibuprofen (ADVIL,MOTRIN) 600 MG tablet Take 1 tablet (600 mg total) by mouth every 6 (six) hours as needed (mild pain). (Patient not taking: Reported on 01/14/2017) 30 tablet 0 Not Taking at Unknown time    Patient Stressors: Marital or family conflict Occupational concerns Traumatic event  Patient Strengths: Ability for insight Supportive family/friends  Treatment Modalities: Medication Management, Group therapy, Case management,  1 to 1 session with clinician, Psychoeducation, Recreational therapy.   Physician Treatment Plan for Primary Diagnosis: Bipolar 2 disorder, major depressive episode (HCC) Long Term Goal(s): Improvement in symptoms so as ready for discharge Improvement in symptoms so as ready for discharge   Short Term Goals: Ability to identify changes in lifestyle to reduce recurrence of condition will improve Ability to verbalize feelings will improve Ability to disclose and discuss suicidal ideas Ability to identify and develop effective coping behaviors will improve Ability  to identify changes in lifestyle to reduce recurrence of condition will improve Ability to verbalize feelings will improve Ability to demonstrate self-control will improve Ability to identify and develop effective coping behaviors will improve Compliance with prescribed medications will improve Ability to identify triggers associated with substance abuse/mental health issues will improve  Medication Management: Evaluate patient's response, side effects, and tolerance of medication regimen.  Therapeutic Interventions: 1 to  1 sessions, Unit Group sessions and Medication administration.  Evaluation of Outcomes: Progressing  Physician Treatment Plan for Secondary Diagnosis: Principal Problem:   Bipolar 2 disorder, major depressive episode (HCC)  Long Term Goal(s): Improvement in symptoms so as ready for discharge Improvement in symptoms so as ready for discharge   Short Term Goals: Ability to identify changes in lifestyle to reduce recurrence of condition will improve Ability to verbalize feelings will improve Ability to disclose and discuss suicidal ideas Ability to identify and develop effective coping behaviors will improve Ability to identify changes in lifestyle to reduce recurrence of condition will improve Ability to verbalize feelings will improve Ability to demonstrate self-control will improve Ability to identify and develop effective coping behaviors will improve Compliance with prescribed medications will improve Ability to identify triggers associated with substance abuse/mental health issues will improve     Medication Management: Evaluate patient's response, side effects, and tolerance of medication regimen.  Therapeutic Interventions: 1 to 1 sessions, Unit Group sessions and Medication administration.  Evaluation of Outcomes: Progressing   RN Treatment Plan for Primary Diagnosis: Bipolar 2 disorder, major depressive episode (HCC) Long Term Goal(s): Knowledge of disease and therapeutic regimen to maintain health will improve  Short Term Goals: Ability to remain free from injury will improve, Ability to disclose and discuss suicidal ideas and Ability to identify and develop effective coping behaviors will improve  Medication Management: RN will administer medications as ordered by provider, will assess and evaluate patient's response and provide education to patient for prescribed medication. RN will report any adverse and/or side effects to prescribing provider.  Therapeutic  Interventions: 1 on 1 counseling sessions, Psychoeducation, Medication administration, Evaluate responses to treatment, Monitor vital signs and CBGs as ordered, Perform/monitor CIWA, COWS, AIMS and Fall Risk screenings as ordered, Perform wound care treatments as ordered.  Evaluation of Outcomes: Progressing   LCSW Treatment Plan for Primary Diagnosis: Bipolar 2 disorder, major depressive episode (HCC) Long Term Goal(s): Safe transition to appropriate next level of care at discharge, Engage patient in therapeutic group addressing interpersonal concerns.  Short Term Goals: Engage patient in aftercare planning with referrals and resources, Facilitate patient progression through stages of change regarding substance use diagnoses and concerns and Identify triggers associated with mental health/substance abuse issues  Therapeutic Interventions: Assess for all discharge needs, 1 to 1 time with Social worker, Explore available resources and support systems, Assess for adequacy in community support network, Educate family and significant other(s) on suicide prevention, Complete Psychosocial Assessment, Interpersonal group therapy.  Evaluation of Outcomes: Progressing   Progress in Treatment: Attending groups: No. new to unit. Continuing to assess. Participating in groups: No. Taking medication as prescribed: Yes. Toleration medication: Yes. Family/Significant other contact made: No, will contact:  family member if patient consents Patient understands diagnosis: Yes. Discussing patient identified problems/goals with staff: Yes. Medical problems stabilized or resolved: Yes. Denies suicidal/homicidal ideation: Yes. Issues/concerns per patient self-inventory: No. Other: n/a   New problem(s) identified: No, Describe:  n/a  New Short Term/Long Term Goal(s): medication stabilization; development of comprehensive mental wellness/sobriety plan.   Discharge  Plan or Barriers: CSW assessing for  appropriate referrals.   Reason for Continuation of Hospitalization: Depression Medication stabilization  Estimated Length of Stay: 2-3 days   Attendees: Patient: 01/16/2017 8:45 AM  Physician:  Dr. Jackquline Berlin MD 01/16/2017 8:45 AM  Nursing: Aggie Cosier RN; Erskine Squibb RN 01/16/2017 8:45 AM  RN Care Manager: Onnie Boer CM 01/16/2017 8:45 AM  Social Worker: Chartered loss adjuster, LCSW; Donnelly Stager LCSWA 01/16/2017 8:45 AM  Recreational Therapist: Juliann Pares 01/16/2017 8:45 AM  Other: Armandina Stammer NP; Gray Bernhardt NP 01/16/2017 8:45 AM  Other:  01/16/2017 8:45 AM  Other: 01/16/2017 8:45 AM    Scribe for Treatment Team: Ledell Peoples Smart, LCSW 01/16/2017 8:45 AM
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
## 2017-01-15 NOTE — Progress Notes (Signed)
D: Pt received in a calm pleasant mood. Cooperative with care. Took all meds without incident and interacted with peers in a constructive way. Denies SI/HI/AVH. Pt's goal is to overcome her sex addiction..  A: Praised pt for participating with groups and working on her goals. Safety checks maintained.  R: Pt responded to praise well. Verbalized no complaints.

## 2017-01-15 NOTE — Progress Notes (Signed)
Pt attended AA group this evening.  
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
## 2017-01-16 LAB — HEMOGLOBIN A1C
Hgb A1c MFr Bld: 5.3 % (ref 4.8–5.6)
Mean Plasma Glucose: 105 mg/dL

## 2017-01-16 LAB — PROLACTIN: Prolactin: 39.3 ng/mL — ABNORMAL HIGH (ref 4.8–23.3)

## 2017-01-16 NOTE — Progress Notes (Signed)
D: Claire Frederick denied SI, HI, and AVH today. She's interacted in the milieu, attended groups, and reports that her anxiety has improved during the day. No behavioral issues noted.   A: Meds given as ordered. No PRNs requested/needed. Q15 safety checks maintained. Support/encouragement offered.  R: Pt remains free from harm and continues with treatment. Will continue to monitor for needs/safety.

## 2017-01-16 NOTE — [facility] Group Notes (Signed)
[facility] LCSW Group Therapy  01/16/2017 3:06 PM  Type of Therapy:  Group Therapy  Participation Level:  Active  Participation Quality:  Attentive  Affect:  Appropriate  Cognitive:  Alert and Oriented  Insight:  Improving  Engagement in Therapy:  Improving  Modes of Intervention:  Confrontation, Discussion, Education, Problem-solving, Socialization and Support  Summary of Progress/Problems: Emotion Regulation: This group focused on both positive and negative emotion identification and allowed group members to process ways to identify feelings, regulate negative emotions, and find healthy ways to manage internal/external emotions. Group members were asked to reflect on a time when their reaction to an emotion led to a negative outcome and explored how alternative responses using emotion regulation would have benefited them. Group members were also asked to discuss a time when emotion regulation was utilized when a negative emotion was experienced.   Stevi Hollinshead N Smart LCSW 01/16/2017, 3:06 PM

## 2017-01-16 NOTE — Progress Notes (Signed)
Nursing Progress Note: 7p-7a D: Pt currently presents with a pleasant/happy affect and behavior. Interacting appropriately with milieu. Pt reports good sleep with current medication regimen.   A: Pt provided with medications per providers orders. Pt's labs and vitals were monitored throughout the night. Pt supported emotionally and encouraged to express concerns and questions. Pt educated on medications.  R: Pt's safety ensured with 15 minute and environmental checks. Pt currently denies SI/HI/Self Harm and AVH. Pt verbally contracts to seek staff if SI/HI or A/VH occurs and to consult with staff before acting on any harmful thoughts. Will continue to monitor.

## 2017-01-16 NOTE — Progress Notes (Signed)
Nursing Progress Note: 7p-7a D: Pt currently presents with a pleasant/happy affect and behavior. Pt states "my goal yesterday was to go to all the groups and I did. It was good to get out." Interacting appropriately with milieu. Pt reports good sleep with current medication regimen.   A: Pt provided with medications per providers orders. Pt's labs and vitals were monitored throughout the night. Pt supported emotionally and encouraged to express concerns and questions. Pt educated on medications.  R: Pt's safety ensured with 15 minute and environmental checks. Pt currently denies SI/HI/Self Harm and AVH. Pt verbally contracts to seek staff if SI/HI or A/VH occurs and to consult with staff before acting on any harmful thoughts. Will continue to monitor.

## 2017-01-16 NOTE — Progress Notes (Signed)
Adult Psychoeducational Group Note  Date:  01/16/2017 Time:  9:39 PM  Group Topic/Focus:  Wrap-Up Group:   The focus of this group is to help patients review their daily goal of treatment and discuss progress on daily workbooks.  Participation Level:  Active  Participation Quality:  Appropriate  Affect:  Appropriate  Cognitive:  Alert  Insight: Appropriate  Engagement in Group:  Engaged  Modes of Intervention:  Discussion  Additional Comments:  Pt stated that today was a good day. She is excited that she did not lose her job as she initially thought was going to happen. Her goal is to try to stay positive.   Flonnie HailstoneCOOKE, Rozlynn Lippold R 01/16/2017, 9:39 PM

## 2017-01-16 NOTE — Progress Notes (Signed)
[facility] MD Progress Note  01/16/2017 1:46 PM Claire Frederick  MRN:  [id number] Subjective:   26 yo old Caucasian female, single, lives with her son, employed. Background history of Bipolar disorder, SUD, childhood traumatic experience. Recently lost a twin pregnancy. Self presented to the ER on account of worsening suicidal thoughts. Had thoughts of crashing her car. Final straw was when she found out that her boyfriend was leaving her for his ex girlfriend.  Nursing staff reports that she has been pleasant. She has been interacting well with peers. She has not been observed to be withdrawn or detached. She participates well at groups. No behavioral issues she has not voiced any thoughts of suicide. She slept well last night. She is tolerating her medications well.   Seen today. Was in groups prior to interview. Says she is feeling better. Her mood is on an even keel. She is able to think clearly. No racing thoughts. She slept well last night. No feelings of emptiness. No thoughts of suicide. No perceptual abnormalities. Patient is not expressing and delusions. No side effects from her medications. Says she has been in contact with her son. He is doing well. Patient plans to call her job today and let them know about possibility of being back early next week.   Principal Problem: Bipolar 2 disorder, major depressive episode (HCC) Diagnosis:   Patient Active Problem List   Diagnosis Date Noted  . Bipolar 2 disorder, major depressive episode (HCC) [F31.81] 01/14/2017  . Ectopic pregnancy [O00.90] 11/24/2016   Total Time spent with patient: 20 minutes  Past Psychiatric History: As in H&P  Past Medical History:  Past Medical History:  Diagnosis Date  . Anxiety   . Bipolar disorder (HCC)   . Depression   . Ectopic pregnancy 11/24/2016  . Endometriosis   . Hypothyroidism     Past Surgical History:  Procedure Laterality Date  . LAPAROSCOPIC UNILATERAL SALPINGECTOMY Right 11/30/2016   Procedure:  LAPAROSCOPIC UNILATERAL SALPINGECTOMY WITH ECTOPIC;  Surgeon: Conard Novak, MD;  Location: [facility] ORS;  Service: Gynecology;  Laterality: Right;  . NO PAST SURGERIES    . TUBAL LIGATION     Family History:  Family History  Problem Relation Age of Onset  . Drug abuse Mother   . Diabetes Father   . Hypercholesterolemia Father    Family Psychiatric  History: As in H&P Social History:  History  Alcohol Use  . Yes    Comment: socially     History  Drug Use  . Types: Marijuana    Social History   Social History  . Marital status: Legally Separated    Spouse name: N/A  . Number of children: N/A  . Years of education: N/A   Social History Main Topics  . Smoking status: Current Every Day Smoker    Packs/day: 0.50    Types: Cigarettes  . Smokeless tobacco: Never Used  . Alcohol use Yes     Comment: socially  . Drug use: Yes    Types: Marijuana  . Sexual activity: Yes    Partners: Male    Birth control/ protection: Pill   Other Topics Concern  . None   Social History Narrative  . None   Additional Social History:    Prescriptions: KLONOPIN 1 MG PO TID PRN 01/13/17 History of alcohol / drug use?: Yes Longest period of sobriety (when/how long): UNKNNOWN Name of Substance 1: NICOTINE/CIGARETTES 1 - Age of First Use: 23 1 - Amount (size/oz): 1/2 PACK  1 - Frequency: DAILY 1 - Duration: ONGOING 1 - Last Use / Amount: 01/13/17 Name of Substance 2: CANNABIS 2 - Age of First Use: 18 2 - Amount (size/oz): VARIES 2 - Frequency: DAILY 2 - Duration: ONGOING 2 - Last Use / Amount: 01/13/17 Name of Substance 3: ALCOHOL 3 - Age of First Use: UNKNOWN 3 - Amount (size/oz): VARIES 3 - Frequency: "ON HOLIDAYS" 3 - Duration: ONGOING 3 - Last Use / Amount: NEW YEAR'S       Sleep: Good  Appetite:  Good  Current Medications: Current Facility-Administered Medications  Medication Dose Route Frequency Provider Last Rate Last Dose  . acetaminophen (TYLENOL) tablet 650 mg   650 mg Oral Q6H PRN Kerry Hough, PA-C      . alum & mag hydroxide-simeth (MAALOX/MYLANTA) 200-200-20 MG/5ML suspension 30 mL  30 mL Oral Q4H PRN Kerry Hough, PA-C      . ARIPiprazole (ABILIFY) tablet 5 mg  5 mg Oral Daily Georgiann Cocker, MD   5 mg at 01/16/17 0820  . gabapentin (NEURONTIN) capsule 300 mg  300 mg Oral TID Kerry Hough, PA-C   300 mg at 01/16/17 1144  . hydrOXYzine (ATARAX/VISTARIL) tablet 25 mg  25 mg Oral Q6H PRN Kerry Hough, PA-C   25 mg at 01/14/17 2204  . ibuprofen (ADVIL,MOTRIN) tablet 600 mg  600 mg Oral Q6H PRN Kerry Hough, PA-C      . loratadine (CLARITIN) tablet 10 mg  10 mg Oral Daily Kerry Hough, PA-C   10 mg at 01/16/17 4098  . magnesium hydroxide (MILK OF MAGNESIA) suspension 30 mL  30 mL Oral Daily PRN Kerry Hough, PA-C   30 mL at 01/14/17 2204  . nicotine (NICODERM CQ - dosed in mg/24 hours) patch 21 mg  21 mg Transdermal Daily Adonis Brook, NP   21 mg at 01/14/17 1457  . traZODone (DESYREL) tablet 100 mg  100 mg Oral QHS Sanjuana Kava, NP   100 mg at 01/15/17 2200    Lab Results:  Results for orders placed or performed during the hospital encounter of 01/14/17 (from the past 48 hour(s))  Hemoglobin A1c     Status: None   Collection Time: 01/15/17  6:30 AM  Result Value Ref Range   Hgb A1c MFr Bld 5.3 4.8 - 5.6 %    Comment: (NOTE)         Pre-diabetes: 5.7 - 6.4         Diabetes: >6.4         Glycemic control for adults with diabetes: <7.0    Mean Plasma Glucose 105 mg/dL    Comment: (NOTE) Performed At: [facility] [address] [place], [place] [id number] Mila Homer MD FA:[phone] Performed at [facility], [address] [place], [place] [address]   Lipid panel     Status: None   Collection Time: 01/15/17  6:30 AM  Result Value Ref Range   Cholesterol 160 0 - 200 mg/dL   Triglycerides 528 <413 mg/dL   HDL 56 >24 mg/dL   Total CHOL/HDL Ratio 2.9 RATIO   VLDL 22 0 - 40 mg/dL    LDL Cholesterol 82 0 - 99 mg/dL    Comment:        Total Cholesterol/HDL:CHD Risk Coronary Heart Disease Risk Table                     Men   Women  1/2 Average Risk   3.4   3.3  Average Risk       5.0   4.4  2 X Average Risk   9.6   7.1  3 X Average Risk  23.4   11.0        Use the calculated Patient Ratio above and the CHD Risk Table to determine the patient's CHD Risk.        ATP III CLASSIFICATION (LDL):  <100     mg/dL   Optimal  [id number]  mg/dL   Near or Above                    Optimal  130-159  mg/dL   Borderline  [id number]  mg/dL   High  >811>190     mg/dL   Very High Performed at [facility], [address] [place], [place] [address]   TSH     Status: None   Collection Time: 01/15/17  6:30 AM  Result Value Ref Range   TSH 2.100 0.350 - 4.500 uIU/mL    Comment: Performed by a 3rd Generation assay with a functional sensitivity of <=0.01 uIU/mL. Performed at [facility], [address] [place], [place] [address]   Prolactin     Status: Abnormal   Collection Time: 01/15/17  6:30 AM  Result Value Ref Range   Prolactin 39.3 (H) 4.8 - 23.3 ng/mL    Comment: (NOTE) Performed At: [facility] [address] [place], [place] [id number] Mila HomerHancock William F MD ON:[phone]:[phone] Performed at [facility], [address] [place], [place] [address]     Blood Alcohol level:  No results found for: [facility]  Metabolic Disorder Labs: Lab Results  Component Value Date   HGBA1C 5.3 01/15/2017   MPG 105 01/15/2017   Lab Results  Component Value Date   PROLACTIN 39.3 (H) 01/15/2017   Lab Results  Component Value Date   CHOL 160 01/15/2017   TRIG 112 01/15/2017   HDL 56 01/15/2017   CHOLHDL 2.9 01/15/2017   VLDL 22 01/15/2017   LDLCALC 82 01/15/2017    Physical Findings: AIMS:  , ,  ,  ,    CIWA:    COWS:     Musculoskeletal: Strength & Muscle Tone: within normal limits Gait & Station: normal Patient leans:  N/A  Psychiatric Specialty Exam: Physical Exam  Constitutional: She is oriented to person, place, and time. She appears well-developed and well-nourished.  HENT:  Head: Normocephalic and atraumatic.  Eyes: Conjunctivae and EOM are normal. Pupils are equal, round, and reactive to light.  Neck: Normal range of motion. Neck supple.  Cardiovascular: Normal rate, regular rhythm and normal heart sounds.   Respiratory: Effort normal and breath sounds normal.  GI: Soft. Bowel sounds are normal.  Musculoskeletal: Normal range of motion.  Neurological: She is alert and oriented to person, place, and time. She has normal reflexes.  Skin: Skin is warm and dry.  Psychiatric:  As above    ROS  Blood pressure 105/64, pulse 68, temperature 97.6 F (36.4 C), temperature source Oral, resp. rate 18, height 5' 2.25" (1.581 m), weight 92.5 kg (204 lb), SpO2 100 %.Body mass index is 37.01 kg/m.  General Appearance: Neatly dressed, pleasant, engaging well and cooperative. Appropriate behavior. Not in any distress. Good relatedness. Not internally stimulated  Eye Contact:  Good  Speech:  Spontaneous, normal prosody. Normal tone and rate.   Volume:  Normal  Mood:  Euthymic  Affect:  Appropriate and Full Range  Thought Process:  Goal Directed and Linear  Orientation:  Full (Time, Place, and Person)  Thought Content:  No delusional theme. No preoccupation with violent thoughts. No negative ruminations. No obsession.  No hallucination in any modality.   Suicidal Thoughts:  No  Homicidal Thoughts:  No  Memory:  Immediate;   Good Recent;   Good Remote;   Good  Judgement:  Good  Insight:  Good  Psychomotor Activity:  Normal  Concentration:  Concentration: Good and Attention Span: Good  Recall:  Good  Fund of Knowledge:  Good  Language:  Good  Akathisia:  No  Handed:    AIMS (if indicated):     Assets:  Communication Skills Desire for Improvement Housing Physical Health Resilience  ADL's:  Intact   Cognition:  WNL  Sleep:  Number of Hours: 6.5     Treatment Plan Summary: Patient is tolerating her medications well. She is responding appropriately to treatment. She is future oriented.  We are still evaluating her.    Psychiatric: Bipolar II Disorder SUD Bereavement  Medical: Recent Miscarriage  Psychosocial:  End of relationship Limited psychosocial support Worries about losing her job  PLAN: 1. Continue current regimen.  2. Continue to monitor mood, behavior and interaction.  Georgiann Cocker, MD 01/16/2017, 1:46 PM

## 2017-01-16 NOTE — [facility] Group Notes (Signed)
[facility] Group Notes:  (Nursing/MHT/Case Management/Adjunct)  Date:  01/16/2017  Time:  10:15 AM  Type of Therapy:  Nurse Education  Participation Level:  Active  Participation Quality:  Appropriate  Affect:  Appropriate  Cognitive:  Appropriate  Insight:  Appropriate  Engagement in Group:  Engaged  Modes of Intervention:  Discussion, Education and Support  Summary of Progress/Problems: Claire Frederick shared that she deals with a lot of anxiety and hopes to cope with it today by meditating and praying more.   Maurine SimmeringShugart, Caeson Filippi M 01/16/2017, 10:15 AM

## 2017-01-17 MED ORDER — MAGNESIUM CITRATE PO SOLN
0.5000 | Freq: Once | ORAL | Status: AC
  Administered 2017-01-17: 0.5 via ORAL

## 2017-01-17 NOTE — Plan of Care (Signed)
Problem: Activity: Goal: Sleeping patterns will improve Outcome: Progressing Patient reports she slept well through out the evening.

## 2017-01-17 NOTE — Tx Team (Signed)
Interdisciplinary Treatment and Diagnostic Plan Update  01/17/2017 Time of Session: [place] MRN: [id number]  Principal Diagnosis: Bipolar 2 disorder, major depressive episode ([place])  Secondary Diagnoses: Principal Problem:   Bipolar 2 disorder, major depressive episode ([place])   Current Medications:  Current Facility-Administered Medications  Medication Dose Route Frequency Provider Last Rate Last Dose  . acetaminophen (TYLENOL) tablet 650 mg  650 mg Oral Q6H PRN Laverle Hobby, PA-C      . alum & mag hydroxide-simeth (MAALOX/MYLANTA) 200-200-20 MG/5ML suspension 30 mL  30 mL Oral Q4H PRN Laverle Hobby, PA-C   30 mL at 01/16/17 1943  . ARIPiprazole (ABILIFY) tablet 5 mg  5 mg Oral Daily Artist Beach, MD   5 mg at 01/17/17 0837  . gabapentin (NEURONTIN) capsule 300 mg  300 mg Oral TID Laverle Hobby, PA-C   300 mg at 01/17/17 8182  . hydrOXYzine (ATARAX/VISTARIL) tablet 25 mg  25 mg Oral Q6H PRN Laverle Hobby, PA-C   25 mg at 01/14/17 2204  . ibuprofen (ADVIL,MOTRIN) tablet 600 mg  600 mg Oral Q6H PRN Laverle Hobby, PA-C      . loratadine (CLARITIN) tablet 10 mg  10 mg Oral Daily Laverle Hobby, PA-C   10 mg at 01/17/17 9937  . magnesium hydroxide (MILK OF MAGNESIA) suspension 30 mL  30 mL Oral Daily PRN Laverle Hobby, PA-C   30 mL at 01/14/17 2204  . nicotine (NICODERM CQ - dosed in mg/24 hours) patch 21 mg  21 mg Transdermal Daily Kerrie Buffalo, NP   21 mg at 01/14/17 1457  . traZODone (DESYREL) tablet 100 mg  100 mg Oral QHS Encarnacion Slates, NP   100 mg at 01/16/17 2135   PTA Medications: Prescriptions Prior to Admission  Medication Sig Dispense Refill Last Dose  . acetaminophen (TYLENOL) 325 MG tablet Take 325 mg by mouth every 6 (six) hours as needed for moderate pain.    Past Month at Unknown time  . clonazePAM (KLONOPIN) 1 MG tablet Take 1 mg by mouth daily as needed for anxiety.   01/13/2017 at 0900  . ibuprofen (ADVIL,MOTRIN) 200 MG tablet Take 400 mg  by mouth every 6 (six) hours as needed for moderate pain.   Past Week at Unknown time  . loratadine (CLARITIN) 10 MG tablet Take 10 mg by mouth daily.   01/12/2017  . HYDROcodone-acetaminophen (NORCO/VICODIN) 5-325 MG tablet Take 2 tablets by mouth every 4 (four) hours as needed for severe pain (pain score >7/10). (Patient not taking: Reported on 01/14/2017) 30 tablet 0 Not Taking at Unknown time  . ibuprofen (ADVIL,MOTRIN) 600 MG tablet Take 1 tablet (600 mg total) by mouth every 6 (six) hours as needed (mild pain). (Patient not taking: Reported on 01/14/2017) 30 tablet 0 Not Taking at Unknown time    Patient Stressors: Marital or family conflict Occupational concerns Traumatic event  Patient Strengths: Ability for insight Supportive family/friends  Treatment Modalities: Medication Management, Group therapy, Case management,  1 to 1 session with clinician, Psychoeducation, Recreational therapy.   Physician Treatment Plan for Primary Diagnosis: Bipolar 2 disorder, major depressive episode ([place]) Long Term Goal(s): Improvement in symptoms so as ready for discharge Improvement in symptoms so as ready for discharge   Short Term Goals: Ability to identify changes in lifestyle to reduce recurrence of condition will improve Ability to verbalize feelings will improve Ability to disclose and discuss suicidal ideas Ability to identify and develop effective coping behaviors  will improve Ability to identify changes in lifestyle to reduce recurrence of condition will improve Ability to verbalize feelings will improve Ability to demonstrate self-control will improve Ability to identify and develop effective coping behaviors will improve Compliance with prescribed medications will improve Ability to identify triggers associated with substance abuse/mental health issues will improve  Medication Management: Evaluate patient's response, side effects, and tolerance of medication regimen.  Therapeutic  Interventions: 1 to 1 sessions, Unit Group sessions and Medication administration.  Evaluation of Outcomes: Met  Physician Treatment Plan for Secondary Diagnosis: Principal Problem:   Bipolar 2 disorder, major depressive episode ([place])  Long Term Goal(s): Improvement in symptoms so as ready for discharge Improvement in symptoms so as ready for discharge   Short Term Goals: Ability to identify changes in lifestyle to reduce recurrence of condition will improve Ability to verbalize feelings will improve Ability to disclose and discuss suicidal ideas Ability to identify and develop effective coping behaviors will improve Ability to identify changes in lifestyle to reduce recurrence of condition will improve Ability to verbalize feelings will improve Ability to demonstrate self-control will improve Ability to identify and develop effective coping behaviors will improve Compliance with prescribed medications will improve Ability to identify triggers associated with substance abuse/mental health issues will improve     Medication Management: Evaluate patient's response, side effects, and tolerance of medication regimen.  Therapeutic Interventions: 1 to 1 sessions, Unit Group sessions and Medication administration.  Evaluation of Outcomes: Met   RN Treatment Plan for Primary Diagnosis: Bipolar 2 disorder, major depressive episode ([place]) Long Term Goal(s): Knowledge of disease and therapeutic regimen to maintain health will improve  Short Term Goals: Ability to remain free from injury will improve, Ability to disclose and discuss suicidal ideas and Ability to identify and develop effective coping behaviors will improve  Medication Management: RN will administer medications as ordered by provider, will assess and evaluate patient's response and provide education to patient for prescribed medication. RN will report any adverse and/or side effects to prescribing provider.  Therapeutic  Interventions: 1 on 1 counseling sessions, Psychoeducation, Medication administration, Evaluate responses to treatment, Monitor vital signs and CBGs as ordered, Perform/monitor CIWA, COWS, AIMS and Fall Risk screenings as ordered, Perform wound care treatments as ordered.  Evaluation of Outcomes: Met   LCSW Treatment Plan for Primary Diagnosis: Bipolar 2 disorder, major depressive episode ([place]) Long Term Goal(s): Safe transition to appropriate next level of care at discharge, Engage patient in therapeutic group addressing interpersonal concerns.  Short Term Goals: Engage patient in aftercare planning with referrals and resources, Facilitate patient progression through stages of change regarding substance use diagnoses and concerns and Identify triggers associated with mental health/substance abuse issues  Therapeutic Interventions: Assess for all discharge needs, 1 to 1 time with Social worker, Explore available resources and support systems, Assess for adequacy in community support network, Educate family and significant other(s) on suicide prevention, Complete Psychosocial Assessment, Interpersonal group therapy.  Evaluation of Outcomes: Met   Progress in Treatment: Attending groups: Yes Participating in groups: No. Taking medication as prescribed: Yes. Toleration medication: Yes. Family/Significant other contact made: SPE completed with pt; pt could not identify any positive supports.  Patient understands diagnosis: Yes. Discussing patient identified problems/goals with staff: Yes. Medical problems stabilized or resolved: Yes. Denies suicidal/homicidal ideation: Yes. Issues/concerns per patient self-inventory: No. Other: n/a   New problem(s) identified: No, Describe:  n/a  New Short Term/Long Term Goal(s): medication stabilization; development of comprehensive mental wellness/sobriety plan.   Discharge  Plan or Barriers: Pt plans to move to [facility] upon discharge. Follow-up made  in [facility] outpatient.   Reason for Continuation of Hospitalization: Medication management   Estimated Length of Stay: 2 days (discharge scheduled for Sunday).   Attendees: Patient: 01/17/2017 10:05 AM  Physician:  Dr. Sanjuana Letters MD 01/17/2017 10:05 AM  Nursing: Clarene Critchley RN; Opal Sidles RN 01/17/2017 10:05 AM  RN Care Manager: Lars Pinks CM 01/17/2017 10:05 AM  Social Worker: Maxie Better, LCSW;  01/17/2017 10:05 AM  Recreational Therapist: Rhunette Croft 01/17/2017 10:05 AM  Other: Lindell Spar NP; Ricky Ala NP 01/17/2017 10:05 AM  Other:  01/17/2017 10:05 AM  Other: 01/17/2017 10:05 AM    Scribe for Treatment Team: [place], LCSW 01/17/2017 10:05 AM

## 2017-01-17 NOTE — Progress Notes (Signed)
Work note provided per pt request. Also, note from psychiatrist stating that pt would benefit from emotional support dog provided to pt for her landlord (per her request). Pt verbalized that it was acceptable for her diagnosis to be on this note. Both notes in her chart.  Trula SladeHeather Smart, MSW, LCSW Clinical Social Worker 01/17/2017 10:42 AM

## 2017-01-17 NOTE — [facility] Group Notes (Signed)
[facility] LCSW Group Therapy  01/17/2017 12:56 PM  Type of Therapy:  Group Therapy  Participation Level:  Active  Participation Quality:  Attentive  Affect:  Appropriate  Cognitive:  Alert and Oriented  Insight:  Improving  Engagement in Therapy:  Engaged  Modes of Intervention:  Confrontation, Discussion, Education, Problem-solving, Socialization and Support  Summary of Progress/Problems:Self Sabotage: Patients were asked to identify examples of self-sabotoge that they have experienced and talk about how to avoid this behavior in the future.   Claire Frederick N Smart LCSW 01/17/2017, 12:56 PM  

## 2017-01-17 NOTE — Progress Notes (Addendum)
[facility]  01/17/2017 10:33 AM Claire Cari CarawayLloyd Frederick  MRN:  [id number] Subjective:   26 yo old Caucasian female, single, lives with her son, employed. Background history of Bipolar disorder, SUD, childhood traumatic experience. Recently lost a twin pregnancy. Self presented to the ER on account of worsening suicidal thoughts. Had thoughts of crashing her car. Final straw was when she found out that her boyfriend was leaving her for his ex girlfriend.  Seen today. Says she is feeling really good on her medication. No mood swings. She is not feeling depressed. She has good energy and able to enjoy activities on the unit. She is glad that she can get back to work on Monday. Says her son is doing okay. She needs to pick him up on Sunday. Patient has a therapeutic dog. She wants a letter from us to her landlord so she can keep animals in the house. We reviewed draft of the letter with her and patient was comfortable with her diagnosis being revealed.  No racing thoughts. Says she is able to think clearly. No hallucination in any modality. Patient is not expressing any delusions. No suicidal thoughts. No thoughts of violence. She is tolerating her medications well. Good appetite and sleeps well at night.  Nursing staff reports that patient has been appropriate on the unit. Patient has been interacting well with peers. No behavioral issues. Patient has not voiced any suicidal thoughts. Patient has not been observed to be internally stimulated. Patient has been adherent with treatment recommendations. Patient has been tolerating their medication well.   SW is finalizing aftercare.    Principal Problem: Bipolar 2 disorder, major depressive episode (HCC) Diagnosis:   Patient Active Problem List   Diagnosis Date Noted  . Bipolar 2 disorder, major depressive episode (HCC) [F31.81] 01/14/2017  . Ectopic pregnancy [O00.90] 11/24/2016   Total Time spent with patient: 20 minutes  Past Psychiatric  History: As in H&P  Past Medical History:  Past Medical History:  Diagnosis Date  . Anxiety   . Bipolar disorder (HCC)   . Depression   . Ectopic pregnancy 11/24/2016  . Endometriosis   . Hypothyroidism     Past Surgical History:  Procedure Laterality Date  . LAPAROSCOPIC UNILATERAL SALPINGECTOMY Right 11/30/2016   Procedure: LAPAROSCOPIC UNILATERAL SALPINGECTOMY WITH ECTOPIC;  Surgeon: Conard NovakStephen D Jackson, MD;  Location: [facility] ORS;  Service: Gynecology;  Laterality: Right;  . NO PAST SURGERIES    . TUBAL LIGATION     Family History:  Family History  Problem Relation Age of Onset  . Drug abuse Mother   . Diabetes Father   . Hypercholesterolemia Father    Family Psychiatric  History: As in H&P Social History:  History  Alcohol Use  . Yes    Comment: socially     History  Drug Use  . Types: Marijuana    Social History   Social History  . Marital status: Legally Separated    Spouse name: N/A  . Number of children: N/A  . Years of education: N/A   Social History Main Topics  . Smoking status: Current Every Day Smoker    Packs/day: 0.50    Types: Cigarettes  . Smokeless tobacco: Never Used  . Alcohol use Yes     Comment: socially  . Drug use: Yes    Types: Marijuana  . Sexual activity: Yes    Partners: Male    Birth control/ protection: Pill   Other Topics Concern  . None  Social History Narrative  . None   Additional Social History:    Prescriptions: KLONOPIN 1 MG PO TID PRN 01/13/17 History of alcohol / drug use?: Yes Longest period of sobriety (when/how long): UNKNNOWN Name of Substance 1: NICOTINE/CIGARETTES 1 - Age of First Use: 23 1 - Amount (size/oz): 1/2 PACK 1 - Frequency: DAILY 1 - Duration: ONGOING 1 - Last Use / Amount: 01/13/17 Name of Substance 2: CANNABIS 2 - Age of First Use: 18 2 - Amount (size/oz): VARIES 2 - Frequency: DAILY 2 - Duration: ONGOING 2 - Last Use / Amount: 01/13/17 Name of Substance 3: ALCOHOL 3 - Age of First Use:  UNKNOWN 3 - Amount (size/oz): VARIES 3 - Frequency: "ON HOLIDAYS" 3 - Duration: ONGOING 3 - Last Use / Amount: NEW YEAR'S       Sleep: Good  Appetite:  Good  Current Medications: Current Facility-Administered Medications  Medication Dose Route Frequency Provider Last Rate Last Dose  . acetaminophen (TYLENOL) tablet 650 mg  650 mg Oral Q6H PRN Kerry Hough, PA-C      . alum & mag hydroxide-simeth (MAALOX/MYLANTA) 200-200-20 MG/5ML suspension 30 mL  30 mL Oral Q4H PRN Kerry Hough, PA-C   30 mL at 01/16/17 1943  . ARIPiprazole (ABILIFY) tablet 5 mg  5 mg Oral Daily Georgiann Cocker, MD   5 mg at 01/17/17 0837  . gabapentin (NEURONTIN) capsule 300 mg  300 mg Oral TID Kerry Hough, PA-C   300 mg at 01/17/17 1610  . hydrOXYzine (ATARAX/VISTARIL) tablet 25 mg  25 mg Oral Q6H PRN Kerry Hough, PA-C   25 mg at 01/14/17 2204  . ibuprofen (ADVIL,MOTRIN) tablet 600 mg  600 mg Oral Q6H PRN Kerry Hough, PA-C      . loratadine (CLARITIN) tablet 10 mg  10 mg Oral Daily Kerry Hough, PA-C   10 mg at 01/17/17 9604  . magnesium hydroxide (MILK OF MAGNESIA) suspension 30 mL  30 mL Oral Daily PRN Kerry Hough, PA-C   30 mL at 01/14/17 2204  . nicotine (NICODERM CQ - dosed in mg/24 hours) patch 21 mg  21 mg Transdermal Daily Adonis Brook, NP   21 mg at 01/14/17 1457  . traZODone (DESYREL) tablet 100 mg  100 mg Oral QHS Sanjuana Kava, NP   100 mg at 01/16/17 2135    Lab Results:  No results found for this or any previous visit (from the past 48 hour(s)).  Blood Alcohol level:  No results found for: [facility]  Metabolic Disorder Labs: Lab Results  Component Value Date   HGBA1C 5.3 01/15/2017   MPG 105 01/15/2017   Lab Results  Component Value Date   PROLACTIN 39.3 (H) 01/15/2017   Lab Results  Component Value Date   CHOL 160 01/15/2017   TRIG 112 01/15/2017   HDL 56 01/15/2017   CHOLHDL 2.9 01/15/2017   VLDL 22 01/15/2017   LDLCALC 82 01/15/2017    Physical  Findings: AIMS:  , ,  ,  ,    CIWA:    COWS:     Musculoskeletal: Strength & Muscle Tone: within normal limits Gait & Station: normal Patient leans: N/A  Psychiatric Specialty Exam: Physical Exam  Constitutional: She is oriented to person, place, and time. She appears well-developed and well-nourished.  HENT:  Head: Normocephalic and atraumatic.  Eyes: Conjunctivae and EOM are normal. Pupils are equal, round, and reactive to light.  Neck: Normal range of motion. Neck supple.  Cardiovascular: Normal rate, regular rhythm and normal heart sounds.   Respiratory: Effort normal and breath sounds normal.  GI: Soft. Bowel sounds are normal.  Musculoskeletal: Normal range of motion.  Neurological: She is alert and oriented to person, place, and time. She has normal reflexes.  Skin: Skin is warm and dry.  Psychiatric:  As above    ROS  Blood pressure 119/76, pulse (!) 107, temperature 98.2 F (36.8 C), temperature source Oral, resp. rate 16, height 5' 2.25" (1.581 m), weight 92.5 kg (204 lb), SpO2 100 %.Body mass index is 37.01 kg/m.  General Appearance: Neatly dressed, pleasant, engaging well and cooperative. Appropriate behavior. Not in any distress. Good relatedness. Not internally stimulated  Eye Contact:  Good  Speech:  Spontaneous, normal prosody. Normal tone and rate.   Volume:  Normal  Mood:  Euthymic  Affect:  Appropriate and Full Range  Thought Process:  Goal Directed and Linear  Orientation:  Full (Time, Place, and Person)  Thought Content:  No delusional theme. No preoccupation with violent thoughts. No negative ruminations. No obsession.  No hallucination in any modality.   Suicidal Thoughts:  No  Homicidal Thoughts:  No  Memory:  Immediate;   Good Recent;   Good Remote;   Good  Judgement:  Good  Insight:  Good  Psychomotor Activity:  Normal  Concentration:  Concentration: Good and Attention Span: Good  Recall:  Good  Fund of Knowledge:  Good  Language:  Good   Akathisia:  No  Handed:    AIMS (if indicated):     Assets:  Communication Skills Desire for Improvement Housing Physical Health Resilience  ADL's:  Intact  Cognition:  WNL  Sleep:  Number of Hours: 6.5     Treatment Plan Summary: Patient is responding well to treatment. No side effects from her medication. No dangerousness. No evidence of psychosis. We would evaluate her further and hopefully discharge her on Sunday.    Psychiatric: Bipolar II Disorder SUD Bereavement  Medical: Recent Miscarriage  Psychosocial:  End of relationship Limited psychosocial support Worries about losing her job  PLAN: 1. Continue current regimen.  2. Continue to monitor mood, behavior and interaction. 3. Letter to support therapeutic animal.   Georgiann Cocker, MD 01/17/2017, 10:33 AMPatient ID: Claire Frederick, female   DOB: September 08, 1991, 26 y.o.   MRN: [id number]

## 2017-01-17 NOTE — Progress Notes (Signed)
Patient ID: Claire Frederick, female   DOB: 12-16-1990, 26 y.o.   MRN: [id number] D: Client visible on the unit, reports depression "5" of 10, also expresses concern about her son who has diabetes. Goal for today: "try think positive, think I have done that for the most part" A: Writer provided emotional support encouraged client to continue to look forward to the future with hope. Medications reviewed, administered as ordered. Staff will monitor q6115min for safety. R: client is safe on the unit, attended group.

## 2017-01-17 NOTE — Progress Notes (Signed)
Recreation Therapy Notes  Date: 01/17/17 Time: 0930 Location: [address] Dayroom  Group Topic: Stress Management  Goal Area(s) Addresses:  Patient will verbalize importance of using healthy stress management.  Patient will identify positive emotions associated with healthy stress management.   Behavioral Response: Engaged  Intervention: Stress Management  Activity :  Meditation.  LRT introduced the stress management technique of meditation.  LRT played a meditation on resilience from the Calm app to allow the patients to engage in the technique.  Patients were to follow along as the meditation played to participate in the technique.   Education:  Stress Management, Discharge Planning.   Education Outcome: Acknowledges edcuation/In group clarification offered/Needs additional education  Clinical Observations/Feedback: Pt attended group.   Rapheal Masso, LRT/CTRS         Hadassah Rana A 01/17/2017 12:16 PM 

## 2017-01-17 NOTE — Progress Notes (Addendum)
  [facility] :  Will you be returning to the same living situation after discharge:  Yes,  temporarily returing to home; [place] At discharge, do you have transportation home?: Yes,  friend Patient is scheduled for discharge on Sunday 01/19/17 per MD. Do you have the ability to pay for your medications: Yes,  mental health  Release of information consent forms completed and submitted to medical records by CSW.  Patient to Follow up at: Follow-up Information    [facility] Follow up on 01/24/2017.   Why:  Appt on this date at 8:30AM for hospital follow-up. Please bring photo ID, social security card if you have it, and any proof of income. Thank you.  Contact information: [address] [place], [place] [address] Phone: [phone] Fax: [phone]          Next level of care provider has access to [facility] Link:no  Safety Planning and Suicide Prevention discussed: Yes,  SPE completed with pt; pt declined to consent to family contact.     Has patient been referred to the Quitline?: Patient refused referral  Patient has been referred for addiction treatment: Yes  Searra Carnathan N Smart LCSW 01/17/2017, 10:07 AM

## 2017-01-17 NOTE — [facility] Suicide Risk Assessment (Signed)
[facility] INPATIENT:  Family/Significant Other Suicide Prevention Education  Suicide Prevention Education:  Patient Refusal for Family/Significant Other Suicide Prevention Education: The patient Claire Frederick has refused to provide written consent for family/significant other to be provided Family/Significant Other Suicide Prevention Education during admission and/or prior to discharge.  Physician notified.  SPE completed with pt, as pt refused to consent to family contact. SPI pamphlet provided to pt and pt was encouraged to share information with support network, ask questions, and talk about any concerns relating to SPE. Pt denies access to guns/firearms and verbalized understanding of information provided. Mobile Crisis information also provided to pt.   Sible Straley N Smart LCSW 01/17/2017, 10:05 AM

## 2017-01-17 NOTE — Progress Notes (Signed)
Data. Patient denies SI/HI/AVH. Patient interacting well with staff and other patients. Affect is bright and she reports, "I am feeling much better and I just want to go home now." Patient discussed the difficulties she experiences with her mother and how she has had to move towns because her mother is causing her issues with people in the town and her work place also. On her self assessment she reports 2/10 for depression and anxiety and 1/10 for hopelessness. Her goal for today is: "Staying possitive." Action. Emotional support and encouragement offered. Education provided on medication, indications and side effect. Q 15 minute checks done for safety. Response. Safety on the unit maintained through 15 minute checks.  Medications taken as prescribed. Attended groups. Remained calm and appropriate through out shift.

## 2017-01-17 NOTE — Progress Notes (Signed)
Return to work note placed in patient's chart-per her request.  Trula SladeHeather Smart, MSW, LCSW Clinical Social Worker 01/17/2017 10:10 AM

## 2017-01-18 LAB — URINALYSIS, ROUTINE W REFLEX MICROSCOPIC
Bilirubin Urine: NEGATIVE
Glucose, UA: NEGATIVE mg/dL
Hgb urine dipstick: NEGATIVE
Ketones, ur: NEGATIVE mg/dL
LEUKOCYTES UA: NEGATIVE
NITRITE: NEGATIVE
PROTEIN: NEGATIVE mg/dL
SPECIFIC GRAVITY, URINE: 1.015 (ref 1.005–1.030)
pH: 7 (ref 5.0–8.0)

## 2017-01-18 NOTE — [facility] Group Notes (Signed)
[facility] LCSW Group Therapy Note  01/18/2017 at 10:00 AM  Type of Therapy and Topic:  Group Therapy: Avoiding Self-Sabotaging and Enabling Behaviors  Participation Level: Active  Participation Quality:  Appropriate  Affect: Flat & Depressed  Cognitive:Alert and oriented  Insight: Developing  Engagement in Therapy:  Developing  Therapeutic models used Cognitive Behavioral Therapy Person-Centered Therapy Motivational Interviewing   Summary of Patient Progress: The main focus of today's process group was to discuss what "self-sabotage" means and use Motivational Interviewing to discuss what benefits, negative or positive, were involved in a self-identified self-sabotaging behavior. We then talked about reasons the patient may want to change the behavior and their current desire to change. Patients were encouraged to process the personal cost of their negative habits.   Carney Bernatherine C Harrill, LCSW

## 2017-01-18 NOTE — Progress Notes (Signed)
Data. Patient denies SI/HI/AVH. Patient interacting well with staff and other patients. Reports pain in her right side. HX of frequent kidney infections and recent tubal rupture from pregnancy. NP notified and order received for UA. Lab obtained. Patient provided with a staff observed bath for comfort. Patient spoke at length about her career goals and her desire, "To work on myself. I have been the type of person who always needs to be with someone. I think I has realized that I just need to be alone for awhile and work on learning to be independent. On her self assessment patient reports 3/10 for depression, 2/10 for hopelessness and 1/10 for anxiety. Her goal for today is: "Getting ready for tomorrow.' Action. Emotional support and encouragement offered. Education provided on medication, indications and side effect. Q 15 minute checks done for safety. Response. Safety on the unit maintained through 15 minute checks.  Medications taken as prescribed. Attended groups. Remained calm and appropriate through out shift.

## 2017-01-18 NOTE — Progress Notes (Signed)
[facility] MD Progress Note  01/18/2017 3:04 PM Claire Frederick  MRN:  [id number] Subjective:   26 yo old Caucasian female, single, lives with her son, employed. Background history of Bipolar disorder, SUD, childhood traumatic experience. Recently lost a twin pregnancy. Self presented to the ER on account of worsening suicidal thoughts. Had thoughts of crashing her car. Final straw was when she found out that her boyfriend was leaving her for his ex girlfriend.  Seen today. Doing well. Tolerating her medications well. Her mood has been stable. No highs or lows. She is optimistic about the future. She is looking forward to being with er son again. No thoughts of suicide lately. No infanticidal thoughts. No thoughts of violence. No evidence of mania. No evidence of psychosis. No evidence of depression. No evidence of anxiety.   Nursing staff reports that she has been appropriate. She has been cooperative and pleasant. She has not voiced any futility thoughts.    Principal Problem: Bipolar 2 disorder, major depressive episode (HCC) Diagnosis:   Patient Active Problem List   Diagnosis Date Noted  . Bipolar 2 disorder, major depressive episode (HCC) [F31.81] 01/14/2017  . Ectopic pregnancy [O00.90] 11/24/2016   Total Time spent with patient: 20 minutes  Past Psychiatric History: As in H&P  Past Medical History:  Past Medical History:  Diagnosis Date  . Anxiety   . Bipolar disorder (HCC)   . Depression   . Ectopic pregnancy 11/24/2016  . Endometriosis   . Hypothyroidism     Past Surgical History:  Procedure Laterality Date  . LAPAROSCOPIC UNILATERAL SALPINGECTOMY Right 11/30/2016   Procedure: LAPAROSCOPIC UNILATERAL SALPINGECTOMY WITH ECTOPIC;  Surgeon: Conard Novak, MD;  Location: [facility] ORS;  Service: Gynecology;  Laterality: Right;  . NO PAST SURGERIES    . TUBAL LIGATION     Family History:  Family History  Problem Relation Age of Onset  . Drug abuse Mother   . Diabetes Father    . Hypercholesterolemia Father    Family Psychiatric  History: As in H&P Social History:  History  Alcohol Use  . Yes    Comment: socially     History  Drug Use  . Types: Marijuana    Social History   Social History  . Marital status: Legally Separated    Spouse name: N/A  . Number of children: N/A  . Years of education: N/A   Social History Main Topics  . Smoking status: Current Every Day Smoker    Packs/day: 0.50    Types: Cigarettes  . Smokeless tobacco: Never Used  . Alcohol use Yes     Comment: socially  . Drug use: Yes    Types: Marijuana  . Sexual activity: Yes    Partners: Male    Birth control/ protection: Pill   Other Topics Concern  . None   Social History Narrative  . None   Additional Social History:    Prescriptions: KLONOPIN 1 MG PO TID PRN 01/13/17 History of alcohol / drug use?: Yes Longest period of sobriety (when/how long): UNKNNOWN Name of Substance 1: NICOTINE/CIGARETTES 1 - Age of First Use: 23 1 - Amount (size/oz): 1/2 PACK 1 - Frequency: DAILY 1 - Duration: ONGOING 1 - Last Use / Amount: 01/13/17 Name of Substance 2: CANNABIS 2 - Age of First Use: 18 2 - Amount (size/oz): VARIES 2 - Frequency: DAILY 2 - Duration: ONGOING 2 - Last Use / Amount: 01/13/17 Name of Substance 3: ALCOHOL 3 - Age of First Use:  UNKNOWN 3 - Amount (size/oz): VARIES 3 - Frequency: "ON HOLIDAYS" 3 - Duration: ONGOING 3 - Last Use / Amount: NEW YEAR'S       Sleep: Good  Appetite:  Good  Current Medications: Current Facility-Administered Medications  Medication Dose Route Frequency Provider Last Rate Last Dose  . acetaminophen (TYLENOL) tablet 650 mg  650 mg Oral Q6H PRN Kerry Hough, PA-C   650 mg at 01/17/17 2139  . alum & mag hydroxide-simeth (MAALOX/MYLANTA) 200-200-20 MG/5ML suspension 30 mL  30 mL Oral Q4H PRN Kerry Hough, PA-C   30 mL at 01/16/17 1943  . ARIPiprazole (ABILIFY) tablet 5 mg  5 mg Oral Daily Georgiann Cocker, MD   5 mg at  01/18/17 0739  . gabapentin (NEURONTIN) capsule 300 mg  300 mg Oral TID Kerry Hough, PA-C   300 mg at 01/18/17 1154  . hydrOXYzine (ATARAX/VISTARIL) tablet 25 mg  25 mg Oral Q6H PRN Kerry Hough, PA-C   25 mg at 01/17/17 1545  . ibuprofen (ADVIL,MOTRIN) tablet 600 mg  600 mg Oral Q6H PRN Kerry Hough, PA-C   600 mg at 01/18/17 1419  . loratadine (CLARITIN) tablet 10 mg  10 mg Oral Daily Kerry Hough, PA-C   10 mg at 01/18/17 0739  . magnesium hydroxide (MILK OF MAGNESIA) suspension 30 mL  30 mL Oral Daily PRN Kerry Hough, PA-C   30 mL at 01/14/17 2204  . nicotine (NICODERM CQ - dosed in mg/24 hours) patch 21 mg  21 mg Transdermal Daily Adonis Brook, NP   21 mg at 01/14/17 1457  . traZODone (DESYREL) tablet 100 mg  100 mg Oral QHS Sanjuana Kava, NP   100 mg at 01/17/17 2138    Lab Results:  No results found for this or any previous visit (from the past 48 hour(s)).  Blood Alcohol level:  No results found for: [facility]  Metabolic Disorder Labs: Lab Results  Component Value Date   HGBA1C 5.3 01/15/2017   MPG 105 01/15/2017   Lab Results  Component Value Date   PROLACTIN 39.3 (H) 01/15/2017   Lab Results  Component Value Date   CHOL 160 01/15/2017   TRIG 112 01/15/2017   HDL 56 01/15/2017   CHOLHDL 2.9 01/15/2017   VLDL 22 01/15/2017   LDLCALC 82 01/15/2017    Physical Findings: AIMS:  , ,  ,  ,    CIWA:    COWS:     Musculoskeletal: Strength & Muscle Tone: within normal limits Gait & Station: normal Patient leans: N/A  Psychiatric Specialty Exam: Physical Exam  Constitutional: She is oriented to person, place, and time. She appears well-developed and well-nourished.  HENT:  Head: Normocephalic and atraumatic.  Eyes: Conjunctivae and EOM are normal. Pupils are equal, round, and reactive to light.  Neck: Normal range of motion. Neck supple.  Cardiovascular: Normal rate, regular rhythm and normal heart sounds.   Respiratory: Effort normal and breath  sounds normal.  GI: Soft. Bowel sounds are normal.  Musculoskeletal: Normal range of motion.  Neurological: She is alert and oriented to person, place, and time. She has normal reflexes.  Skin: Skin is warm and dry.  Psychiatric:  As above    ROS  Blood pressure 101/62, pulse (!) 112, temperature 97.7 F (36.5 C), temperature source Oral, resp. rate 18, height 5' 2.25" (1.581 m), weight 92.5 kg (204 lb), SpO2 100 %.Body mass index is 37.01 kg/m.  General Appearance: Neatly dressed, pleasant,  engaging well and cooperative. Appropriate behavior. Not in any distress. Good relatedness. Not internally stimulated  Eye Contact:  Good  Speech:  Spontaneous, normal prosody. Normal tone and rate.   Volume:  Normal  Mood:  Euthymic  Affect:  Appropriate and Full Range  Thought Process:  Goal Directed and Linear  Orientation:  Full (Time, Place, and Person)  Thought Content:  No delusional theme. No preoccupation with violent thoughts. No negative ruminations. No obsession.  No hallucination in any modality.   Suicidal Thoughts:  No  Homicidal Thoughts:  No  Memory:  Immediate;   Good Recent;   Good Remote;   Good  Judgement:  Good  Insight:  Good  Psychomotor Activity:  Normal  Concentration:  Concentration: Good and Attention Span: Good  Recall:  Good  Fund of Knowledge:  Good  Language:  Good  Akathisia:  No  Handed:    AIMS (if indicated):     Assets:  Communication Skills Desire for Improvement Housing Physical Health Resilience  ADL's:  Intact  Cognition:  WNL  Sleep:  Number of Hours: 6.75     Treatment Plan Summary: Patient is doing well. Mood has stabilized. No dangerousness.   Psychiatric: Bipolar II Disorder SUD Bereavement  Medical: Recent Miscarriage  Psychosocial:  End of relationship Limited psychosocial support Worries about losing her job  PLAN: 1. Continue current regimen.  2. Continue to monitor mood, behavior and interaction. 3. For  discharge tomorrow  Georgiann CockerVincent A Keymiah Lyles, MD 01/18/2017, 3:04 PMPatient ID: Claire Frederick, female   DOB: [birth year], 26 y.o.   MRN: [id number] Patient ID: Claire Frederick, female   DOB: [birth year], 26 y.o.   MRN: [id number]

## 2017-01-18 NOTE — [facility] Group Notes (Signed)
[facility] Group Notes:  (Nursing/MHT/Case Management/Adjunct)  Date:  01/18/2017  Time:  4:23 PM  Type of Therapy:  Nurse Education  Participation Level:  Active  Participation Quality:  Appropriate  Affect:  Appropriate  Cognitive:  Appropriate  Insight:  Appropriate  Engagement in Group:  Engaged  Modes of Intervention:  Discussion  Summary of Progress/Problems:  Education group regarding communication styles. Patient was able to identify her style as - "Aggressive and passive."    Almira Barenny G Mercedies Ganesh 01/18/2017, 4:23 PM

## 2017-01-18 NOTE — Plan of Care (Signed)
Problem: Coping: Goal: Ability to cope will improve Outcome: Progressing Ability to cope will improve AEB positive thoughts to move forward continuing to focus on her well-being and that of her child.

## 2017-01-18 NOTE — Plan of Care (Signed)
Problem: Activity: Goal: Interest or engagement in leisure activities will improve Outcome: Progressing Patient has participated in most of the groups on the unit.

## 2017-01-19 MED ORDER — IBUPROFEN 200 MG PO TABS: 400.0000 mg | ORAL_TABLET | Freq: Four times a day (QID) | ORAL | 0 refills | Status: DC | PRN

## 2017-01-19 MED ORDER — TRAZODONE HCL 100 MG PO TABS: 100.0000 mg | ORAL_TABLET | Freq: Every day | ORAL | 0 refills | Status: DC

## 2017-01-19 MED ORDER — LORATADINE 10 MG PO TABS: 10.0000 mg | ORAL_TABLET | Freq: Every day | ORAL | Status: DC

## 2017-01-19 MED ORDER — GABAPENTIN 300 MG PO CAPS: 300.0000 mg | ORAL_CAPSULE | Freq: Three times a day (TID) | ORAL | 0 refills | Status: DC

## 2017-01-19 MED ORDER — NICOTINE 21 MG/24HR TD PT24: 21.0000 mg | MEDICATED_PATCH | Freq: Every day | TRANSDERMAL | 0 refills | Status: DC

## 2017-01-19 MED ORDER — ACETAMINOPHEN 325 MG PO TABS: 325.0000 mg | ORAL_TABLET | Freq: Four times a day (QID) | ORAL | Status: AC | PRN

## 2017-01-19 MED ORDER — ARIPIPRAZOLE 5 MG PO TABS: 5.0000 mg | ORAL_TABLET | Freq: Every day | ORAL | 0 refills | Status: DC

## 2017-01-19 MED ORDER — HYDROXYZINE HCL 25 MG PO TABS
25.0000 mg | ORAL_TABLET | Freq: Four times a day (QID) | ORAL | 0 refills | Status: DC | PRN
Start: 2017-01-19 — End: 2020-02-16
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
## 2017-01-19 NOTE — Progress Notes (Signed)
Data. Patient denies SI/HI/AVH. Patient interacting well with staff and other patients. Affect is very bright and she reports, "I am so excited to go home and see my young'un." Talking rapidly with huge smile on her face. On her self assessment she reports 0/10 for depression and hopelessness and 3/10 for anxiety ("B/C I'm leaving.") Her goal for today is "Leaving and seeing my son." Action. Emotional support and encouragement offered. Education provided on medication, indications and side effect. Q 15 minute checks done for safety. Response. Safety on the unit maintained through 15 minute checks.  Medications taken as prescribed. Attended groups. Remained calm and appropriate through out shift.  Pt. discharged to lobby.  Belongings sheet reviewed and signed by pt. and all belongings, including scripts and sample medications,  sent home. Paperwork reviewed and pt. able to verbalize understanding of education. Pt. in no current distress and ambulatory.

## 2017-01-19 NOTE — Progress Notes (Signed)
D.  Pt pleasant on approach, no complaints voiced.  Pt did not attend evening AA group, had been experiencing some discomfort in her side from prior tubal pregnancy and subsequent miscarriage.  Pt denies SI/HI/hallucinations at this time.  A.  Support and encouragement offered  R.  Pt remains safe on the unit, will continue to monitor.
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
## 2017-07-22 ENCOUNTER — Encounter: Payer: Self-pay | Admitting: Emergency Medicine

## 2017-07-22 ENCOUNTER — Emergency Department: Payer: Self-pay

## 2017-07-22 ENCOUNTER — Emergency Department
Admission: EM | Admit: 2017-07-22 | Discharge: 2017-07-22 | Disposition: A | Discharge status: Home / Self Care | Payer: Self-pay | Attending: Emergency Medicine | Admitting: Emergency Medicine

## 2017-07-22 DIAGNOSIS — E039 Hypothyroidism, unspecified: Secondary | ICD-10-CM | POA: Insufficient documentation

## 2017-07-22 DIAGNOSIS — J069 Acute upper respiratory infection, unspecified: Secondary | ICD-10-CM | POA: Insufficient documentation

## 2017-07-22 DIAGNOSIS — F17210 Nicotine dependence, cigarettes, uncomplicated: Secondary | ICD-10-CM | POA: Insufficient documentation

## 2017-07-22 DIAGNOSIS — R0602 Shortness of breath: Secondary | ICD-10-CM | POA: Insufficient documentation

## 2017-07-22 DIAGNOSIS — Z79899 Other long term (current) drug therapy: Secondary | ICD-10-CM | POA: Insufficient documentation

## 2017-07-22 DIAGNOSIS — J029 Acute pharyngitis, unspecified: Secondary | ICD-10-CM | POA: Insufficient documentation

## 2017-07-22 LAB — POCT RAPID STREP A: Streptococcus, Group A Screen (Direct): NEGATIVE

## 2017-07-22 MED ORDER — DEXAMETHASONE 4 MG PO TABS
12.0000 mg | ORAL_TABLET | Freq: Once | ORAL | Status: AC
  Administered 2017-07-22: 12 mg via ORAL
  Filled 2017-07-22 (×2): qty 3

## 2017-07-22 NOTE — ED Provider Notes (Signed)
[facility] Emergency Department Provider Note  ____________________________________________   First MD Initiated Contact with Patient 07/22/17 0400     (approximate)  I have reviewed the triage vital signs and the nursing notes.   HISTORY  Chief Complaint Sore Throat and Shortness of Breath    HPI Claire Frederick is a 26 y.o. female who comes to the emergency department with 2-3 days of low-grade fever to the high 99's and 1 day of sore throat and shortness of breath. She felt well until about last night around dinnertime when she began to feel sick again. She went to sleep and woke up early this morning with sore throat difficulty swallowing and felt short of breath. Her toddler has similar symptoms and was diagnosed with otitis media. She feels moderate to severe sharp throat pain worse with swallowing improved and not swallowing. She has no drooling. She is able to eat and drink.   Past Medical History:  Diagnosis Date  . Anxiety   . Bipolar disorder (HCC)   . Depression   . Ectopic pregnancy 11/24/2016  . Endometriosis   . Hypothyroidism     Patient Active Problem List   Diagnosis Date Noted  . Bipolar 2 disorder, major depressive episode (HCC) 01/14/2017  . Ectopic pregnancy 11/24/2016    Past Surgical History:  Procedure Laterality Date  . LAPAROSCOPIC UNILATERAL SALPINGECTOMY Right 11/30/2016   Procedure: LAPAROSCOPIC UNILATERAL SALPINGECTOMY WITH ECTOPIC;  Surgeon: Conard NovakStephen D Jackson, MD;  Location: [facility] ORS;  Service: Gynecology;  Laterality: Right;  . NO PAST SURGERIES    . TUBAL LIGATION      Prior to Admission medications   Medication Sig Start Date End Date Taking? Authorizing Provider  acetaminophen (TYLENOL) 325 MG tablet Take 1 tablet (325 mg total) by mouth every 6 (six) hours as needed for moderate pain. 01/19/17   Armandina StammerNwoko, Agnes I, NP  ARIPiprazole (ABILIFY) 5 MG tablet Take 1 tablet (5 mg total) by mouth daily. For mood  control 01/20/17   Armandina StammerNwoko, Agnes I, NP  gabapentin (NEURONTIN) 300 MG capsule Take 1 capsule (300 mg total) by mouth 3 (three) times daily. For agitation 01/19/17   Armandina StammerNwoko, Agnes I, NP  hydrOXYzine (ATARAX/VISTARIL) 25 MG tablet Take 1 tablet (25 mg total) by mouth every 6 (six) hours as needed for anxiety. 01/19/17   Armandina StammerNwoko, Agnes I, NP  ibuprofen (ADVIL,MOTRIN) 200 MG tablet Take 2 tablets (400 mg total) by mouth every 6 (six) hours as needed for moderate pain. 01/19/17   Armandina StammerNwoko, Agnes I, NP  loratadine (CLARITIN) 10 MG tablet Take 1 tablet (10 mg total) by mouth daily. For allergies 01/19/17   Armandina StammerNwoko, Agnes I, NP  nicotine (NICODERM CQ - DOSED IN MG/24 HOURS) 21 mg/24hr patch Place 1 patch (21 mg total) onto the skin daily. For smoking cessation 01/20/17   Armandina StammerNwoko, Agnes I, NP  traZODone (DESYREL) 100 MG tablet Take 1 tablet (100 mg total) by mouth at bedtime. For sleep 01/19/17   Armandina StammerNwoko, Agnes I, NP    Allergies Patient has no known allergies.  Family History  Problem Relation Age of Onset  . Drug abuse Mother   . Diabetes Father   . Hypercholesterolemia Father     Social History Social History  Substance Use Topics  . Smoking status: Current Every Day Smoker    Packs/day: 0.50    Types: Cigarettes  . Smokeless tobacco: Never Used  . Alcohol use Yes     Comment: socially  Review of Systems Constitutional: positive fevers negative chills ENT: positive sore throat. Cardiovascular: Denies chest pain. Respiratory: positive shortness of breath. Gastrointestinal: No abdominal pain.  No nausea, no vomiting.  No diarrhea.  No constipation. Musculoskeletal: Negative for back pain. Neurological: Negative for headaches   ____________________________________________   PHYSICAL EXAM:  VITAL SIGNS: ED Triage Vitals [07/22/17 0334]  Enc Vitals Group     BP (!) 130/93     Pulse Rate 85     Resp 20     Temp 97.9 F (36.6 C)     Temp Source Oral     SpO2 98 %     Weight 203 lb (92.1 kg)      Height 5\' 4"  (1.626 m)     Head Circumference      Peak Flow      Pain Score 5     Pain Loc      Pain Edu?      Excl. in GC?     Constitutional:alert and oriented 4 somewhat hoarse voice in no distress Head: Atraumatic. Nose: No congestion/rhinnorhea. Mouth/Throat: No trismus no pharyngealexudate but mild erythema no lesions Neck: No stridor.   Cardiovascular: regular rate and rhythm no murmurs Respiratory: Normal respiratory effort.  No retractions.ild crackles mid lung fields on the left moving good air Gastrointestinal: soft nontender Neurologic:  Normal speech and language. No gross focal neurologic deficits are appreciated.  Skin:  Skin is warm, dry and intact. No rash noted.    ____________________________________________  LABS (all labs ordered are listed, but only abnormal results are displayed)  Labs Reviewed  POCT RAPID STREP A    Strep negative __________________________________________  EKG   ____________________________________________  RADIOLOGY  Chest x-ray no acute disease ____________________________________________   PROCEDURES  Procedure(s) performed: no  Procedures  Critical Care performed: no  Observation: no ____________________________________________   INITIAL IMPRESSION / ASSESSMENT AND PLAN / ED COURSE  Pertinent labs & imaging results that were available during my care of the patient were reviewed by me and considered in my medical decision making (see chart for details).  The patient is very well-appearing with somewhat hoarse voice. Her Centor score is and she is rapid strep negative.  Given her unilateral crackles obtained a chest x-ray which fortunately is negative. A do not believe she has pneumonia. We'll give her a single dose of dexamethasone and strict return precautions.      ____________________________________________   FINAL CLINICAL IMPRESSION(S) / ED DIAGNOSES  Final diagnoses:  None      NEW  MEDICATIONS STARTED DURING THIS VISIT:  New Prescriptions   No medications on file     Note:  This document was prepared using Dragon voice recognition software and may include unintentional dictation errors.      Merrily Brittle, MD 07/22/17 [phone]

## 2017-07-22 NOTE — ED Notes (Signed)
E-sign not working, paper copy on chart. IT notified

## 2017-07-22 NOTE — Discharge Instructions (Signed)
Please take 1000 mg of Tylenol or 600 mg of ibuprofen 3 times a day as needed for pain and fever. Purchase a EchoStareti Pot and use it every day.  Return to the emergency department for any concerns such as if you cannot eat or drink, if you become more short of breath, or for any other concerns whatsoever.  It was a pleasure to take care of you today, and thank you for coming to our emergency department.  If you have any questions or concerns before leaving please ask the nurse to grab me and I'm more than happy to go through your aftercare instructions again.  If you were prescribed any opioid pain medication today such as Norco, Vicodin, Percocet, morphine, hydrocodone, or oxycodone please make sure you do not drive when you are taking this medication as it can alter your ability to drive safely.  If you have any concerns once you are home that you are not improving or are in fact getting worse before you can make it to your follow-up appointment, please do not hesitate to call 911 and come back for further evaluation.  Merrily BrittleNeil Khadejah Son, MD  Results for orders placed or performed during the hospital encounter of 07/22/17  POCT rapid strep A [facility])  Result Value Ref Range   Streptococcus, Group A Screen (Direct) NEGATIVE NEGATIVE   Dg Chest 1 View  Result Date: 07/22/2017 CLINICAL DATA:  Dyspnea all day, awakened tonight gasping for air. EXAM: CHEST 1 VIEW COMPARISON:  07/06/2007 FINDINGS: A single AP portable view of the chest demonstrates no focal airspace consolidation or alveolar edema. The lungs are grossly clear. There is no large effusion or pneumothorax. Cardiac and mediastinal contours appear unremarkable. IMPRESSION: No active disease. Electronically Signed   By: Ellery Plunkaniel R Mitchell M.D.   On: 07/22/2017 04:29

## 2017-07-22 NOTE — ED Triage Notes (Signed)
Patient reports feeling short of breath all day.  States woke up tonight "gasping" for air.  States feels like her throat is swollen and that her ears hurt and that she can't swallow.  Patient is speaking in complete sentences, patient is able to control own secretions without difficulty.  No acute respiratory distress noted.

## 2018-02-18 DIAGNOSIS — F331 Major depressive disorder, recurrent, moderate: Secondary | ICD-10-CM | POA: Insufficient documentation

## 2018-02-18 DIAGNOSIS — F603 Borderline personality disorder: Secondary | ICD-10-CM | POA: Insufficient documentation

## 2018-03-12 IMAGING — US US ART/VEN ABD/PELV/SCROTUM DOPPLER LTD
1 series · 13 of 25 positions shown · non-contrast
Comparison: None of this gestation

CLINICAL DATA: Pelvic pain and bleeding for 4 hours.
First-trimester pregnancy. Pending quantitative beta HCG.



[Series 1: us art/ven abd/pelv/scrotum doppler ltd · 0.17mm/px · 13 of 96 slices shown]
[im 1/96]
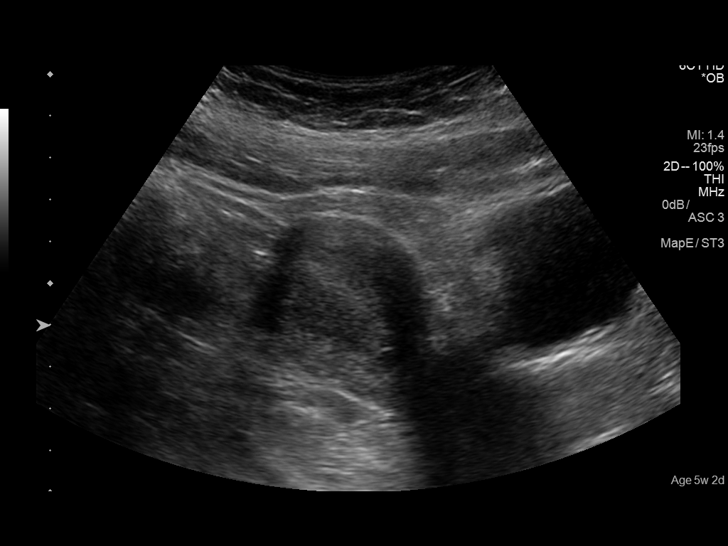
[im 8/96]
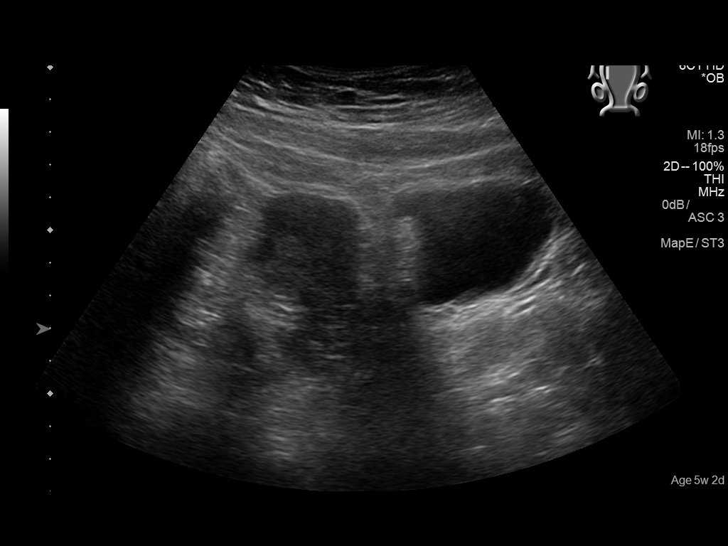
[im 16/96]
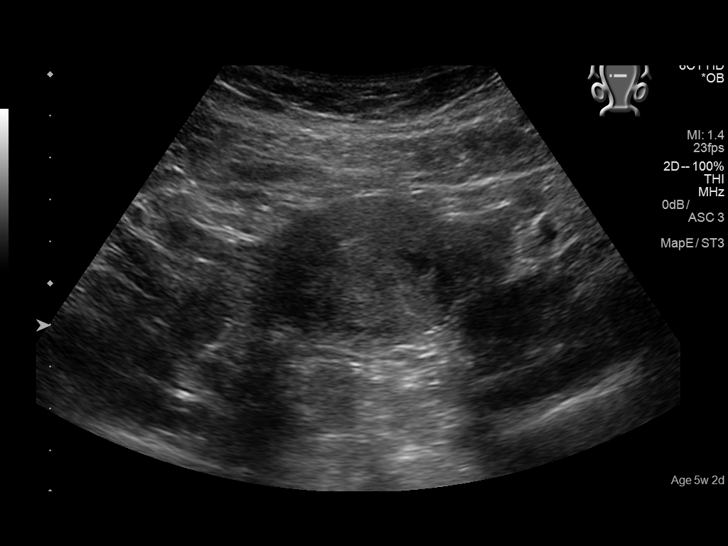
[im 24/96]
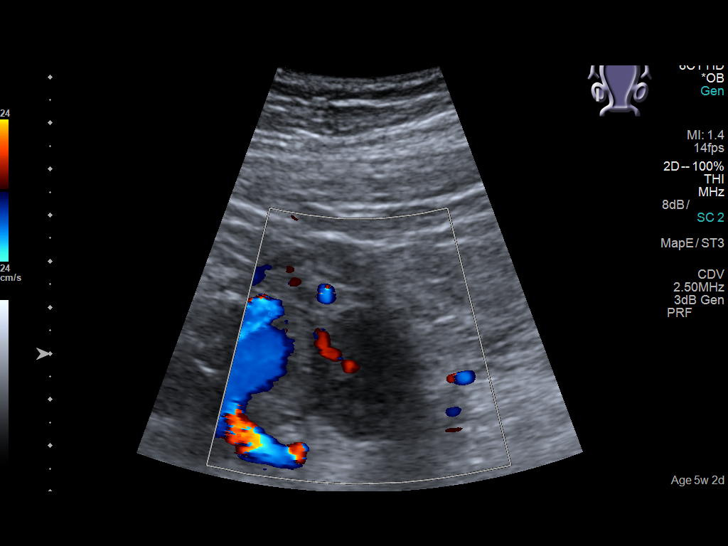
[im 32/96]
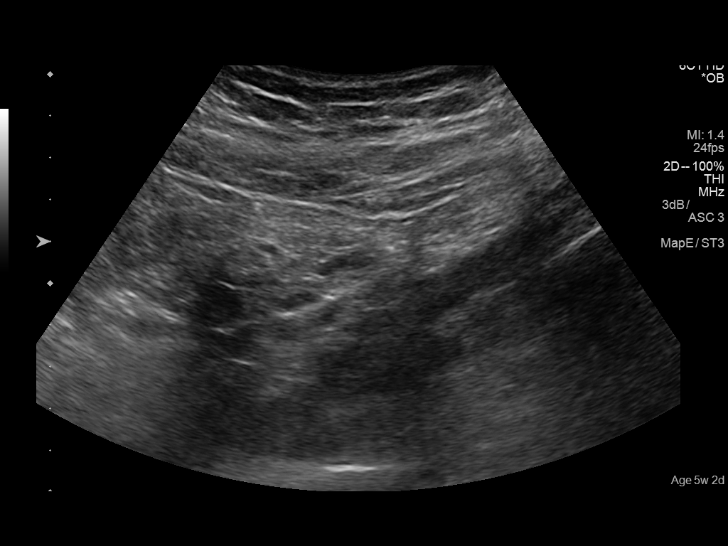
[im 40/96]
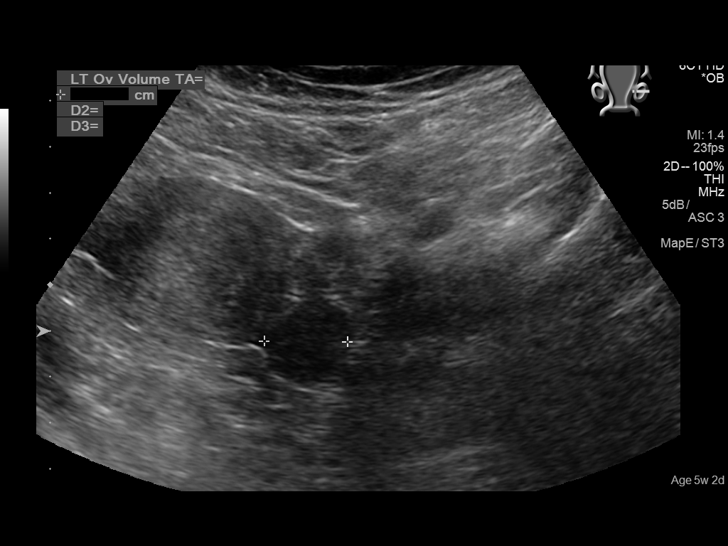
[im 48/96]
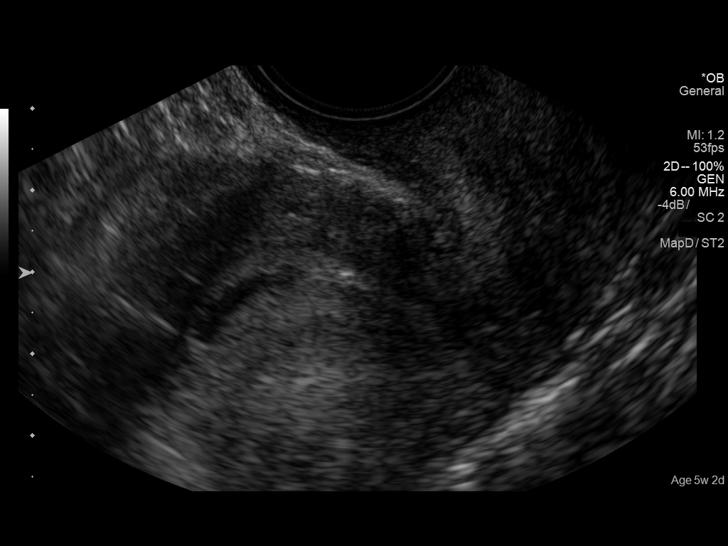
[im 56/96]
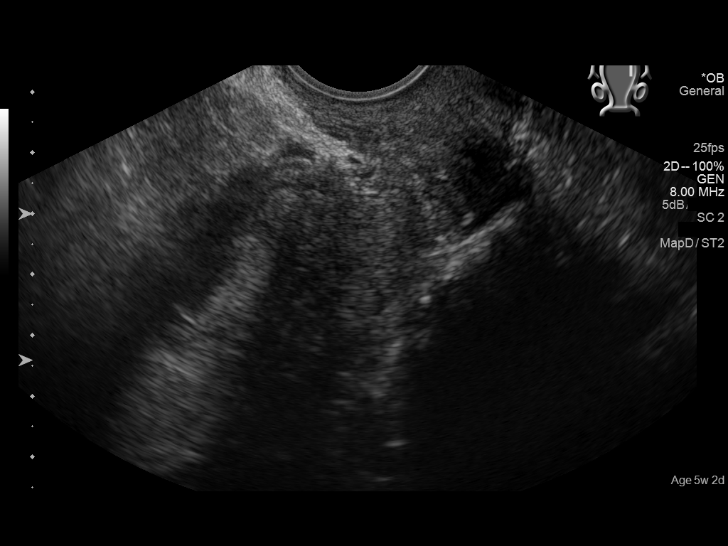
[im 64/96]
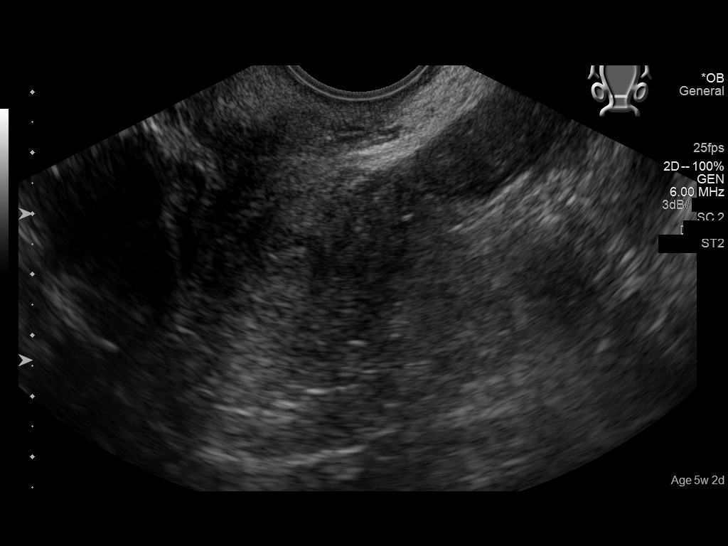
[im 72/96]
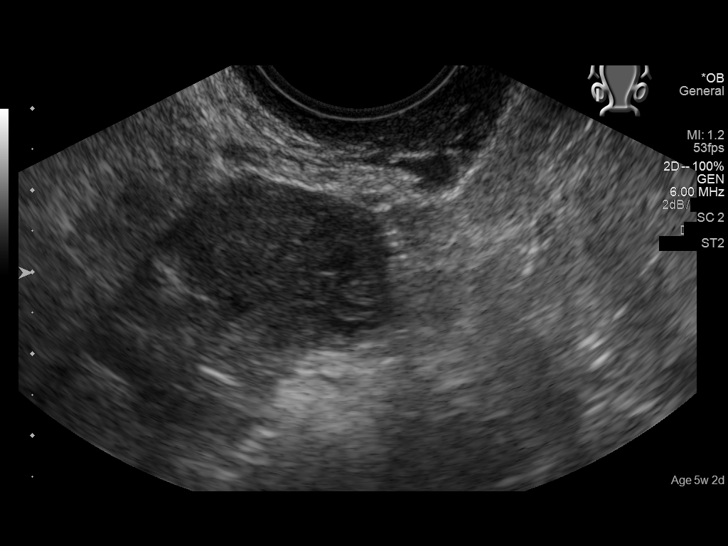
[im 80/96]
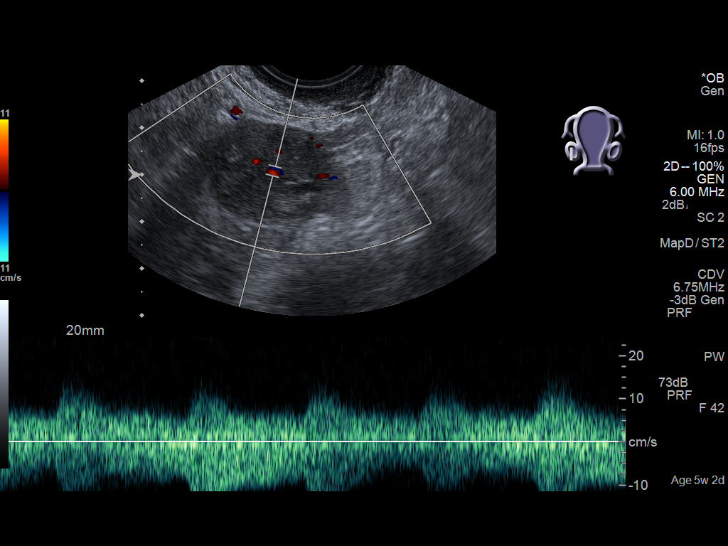
[im 88/96]
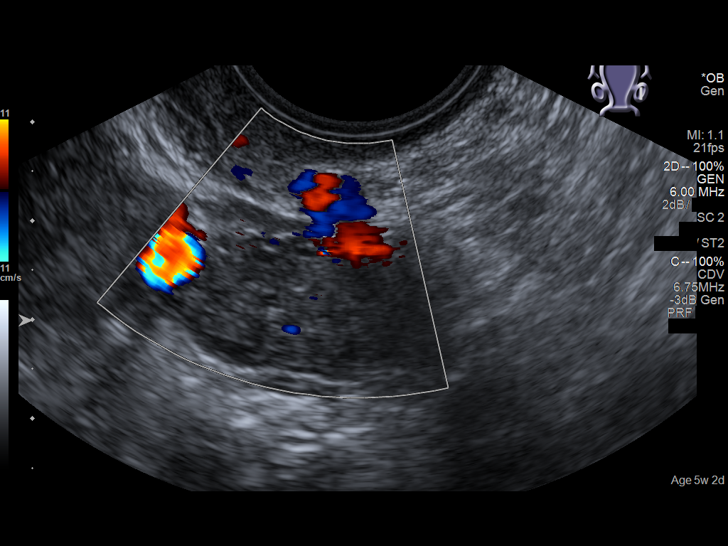
[im 96/96]
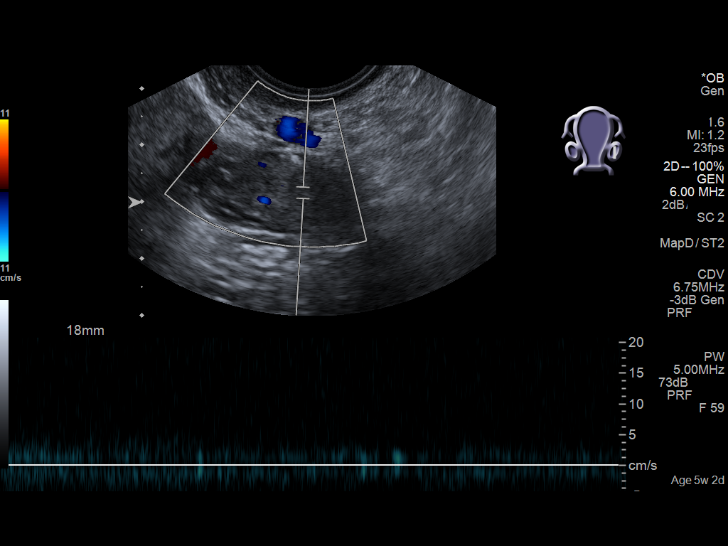

[13 of 25 positions shown; findings below may reference images not displayed]

FINDINGS: Gestational sac: Possibly present -there is an ovoid fluid
collection in the endometrial cavity.

Yolk sac:  Not seen

Embryo:  Not seen

Putative MSD: 9.1  mm   5 w   5  d

Subchorionic hemorrhage:  None visualized.

Maternal uterus/adnexae: Probable corpus luteum on the right.Pulsed
Doppler evaluation of both ovaries demonstrates normal appearing
low-resistance arterial and venous waveforms.
IMPRESSION: 1. Possible intrauterine gestational sac but no confirmatory yolk
sac or fetal pole. Recommend follow-up quantitative B-HCG levels and
follow-up US in 14 days to assess viability. This recommendation
follows SRU consensus guidelines: Diagnostic Criteria for Nonviable
Pregnancy Early in the First Trimester. N Engl J Med 6000;
2. Negative for ovarian torsion.

## 2018-03-23 IMAGING — US US ART/VEN ABD/PELV/SCROTUM DOPPLER LTD
1 series · 13 of 25 positions shown · non-contrast
Comparison: 11/13/2016

CLINICAL DATA: Vaginal bleeding



[Series 1: us art/ven abd/pelv/scrotum doppler ltd · 0.23mm/px · 13 of 83 slices shown]
[im 1/83]
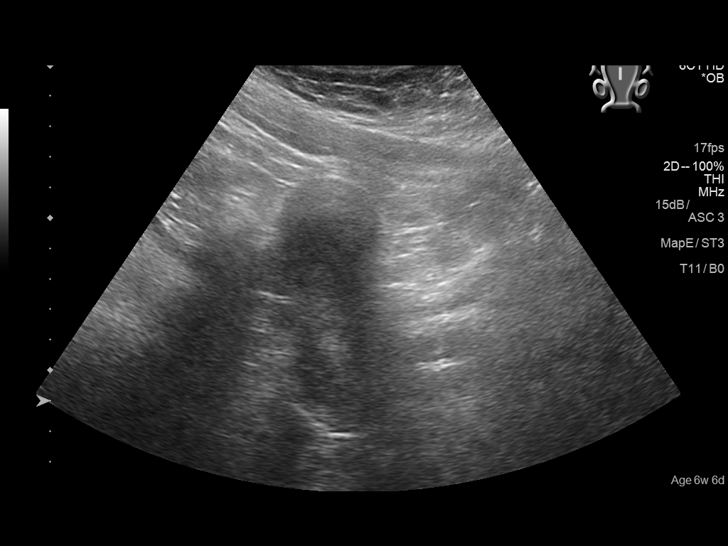
[im 7/83]
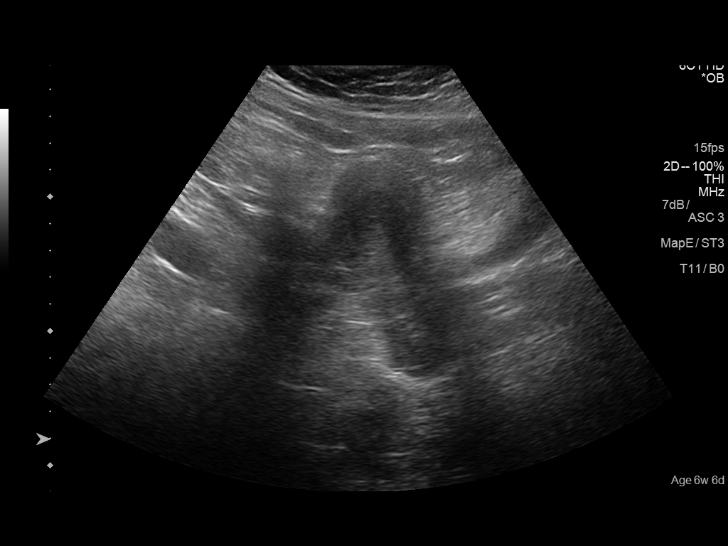
[im 14/83]
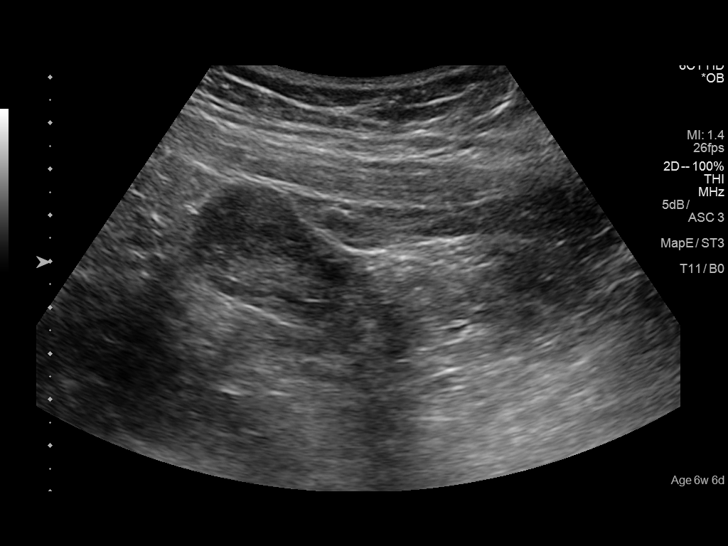
[im 21/83]
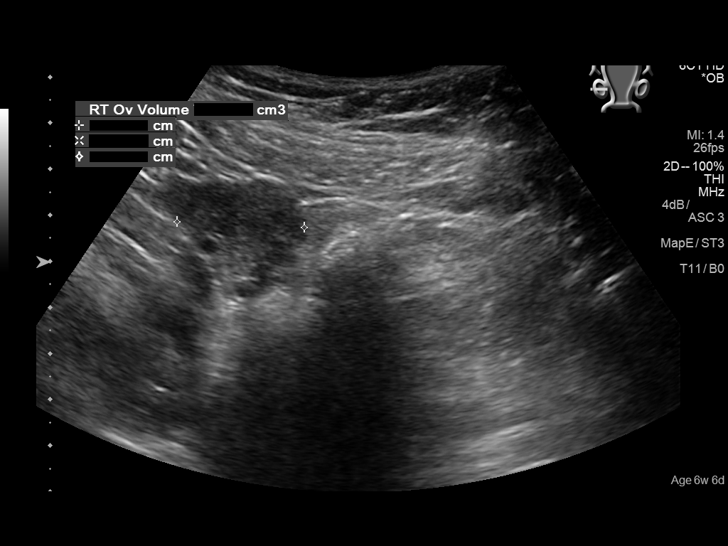
[im 28/83]
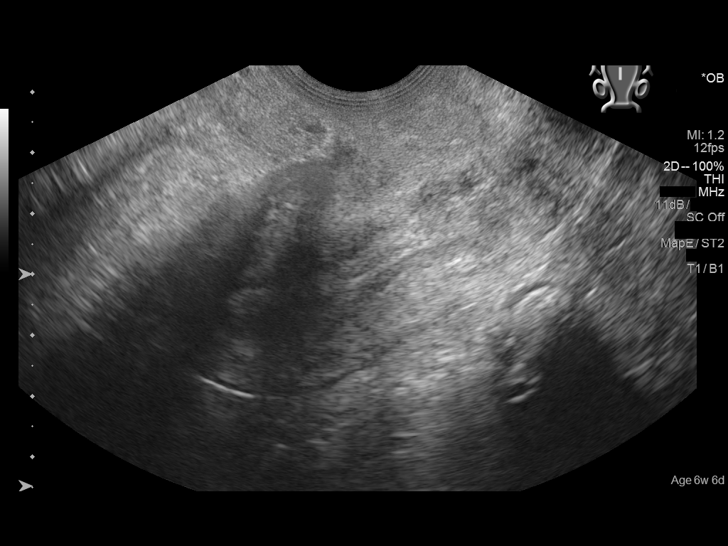
[im 35/83]
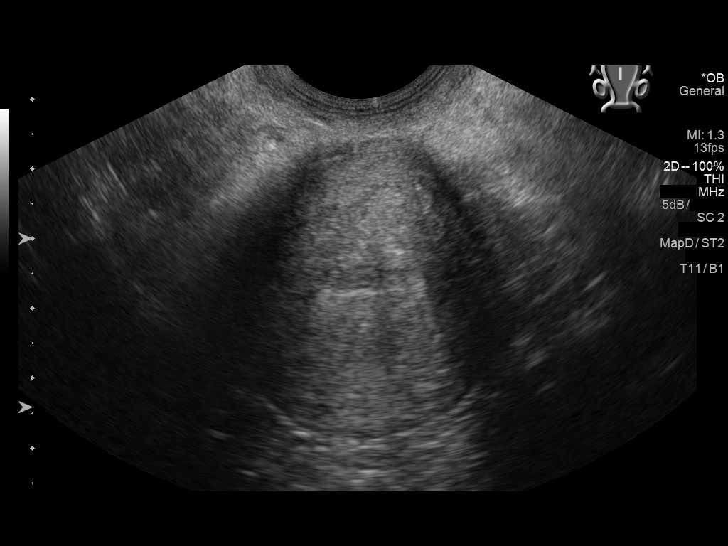
[im 42/83]
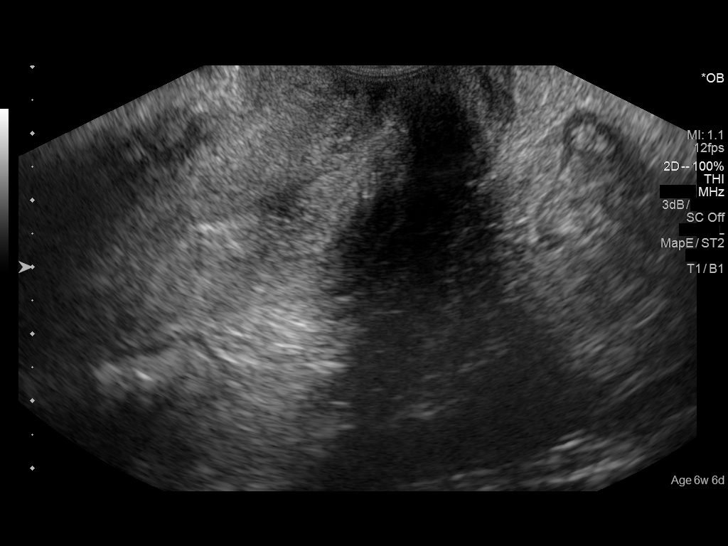
[im 48/83]
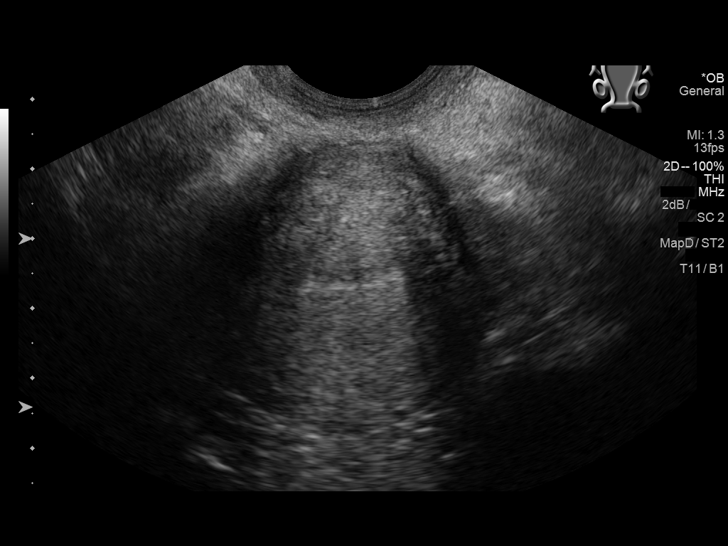
[im 55/83]
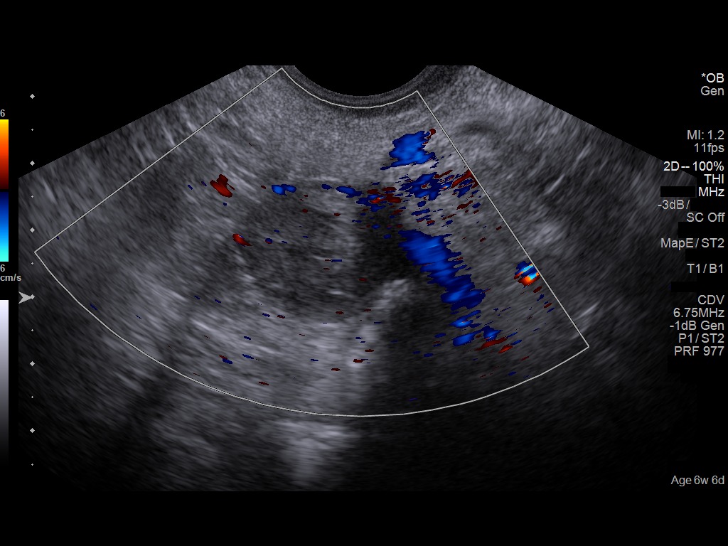
[im 62/83]
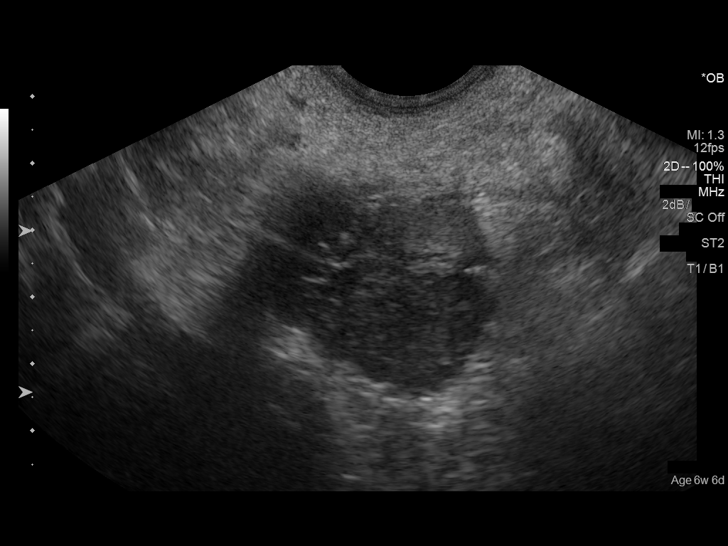
[im 69/83]
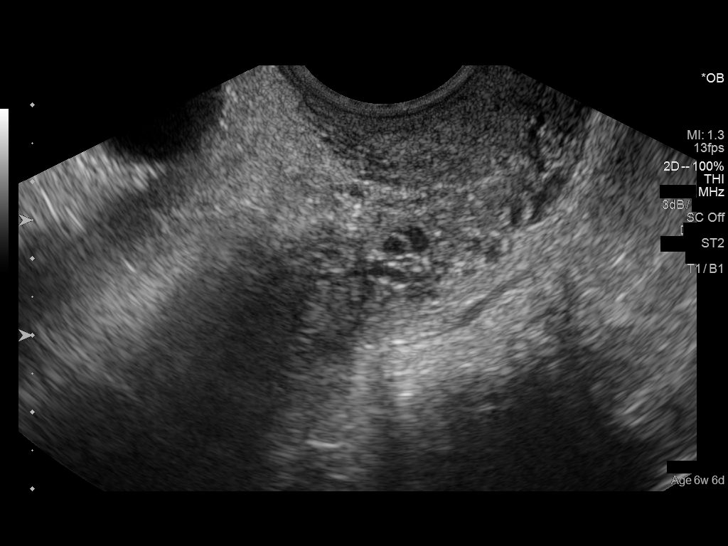
[im 76/83]
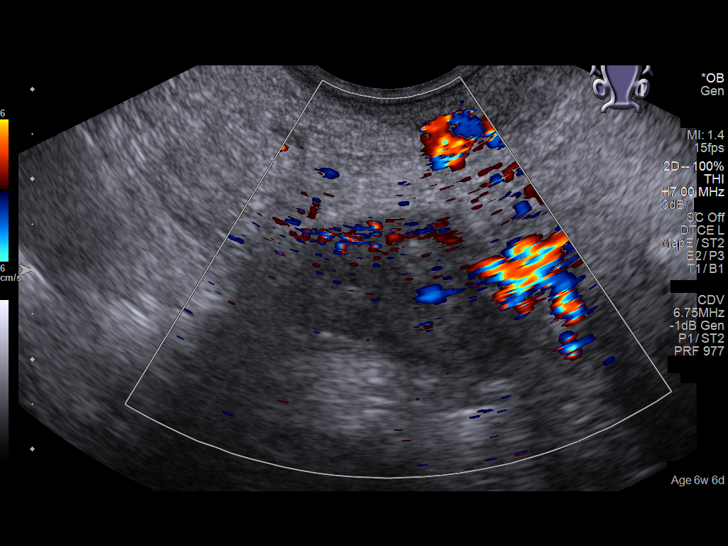
[im 83/83]
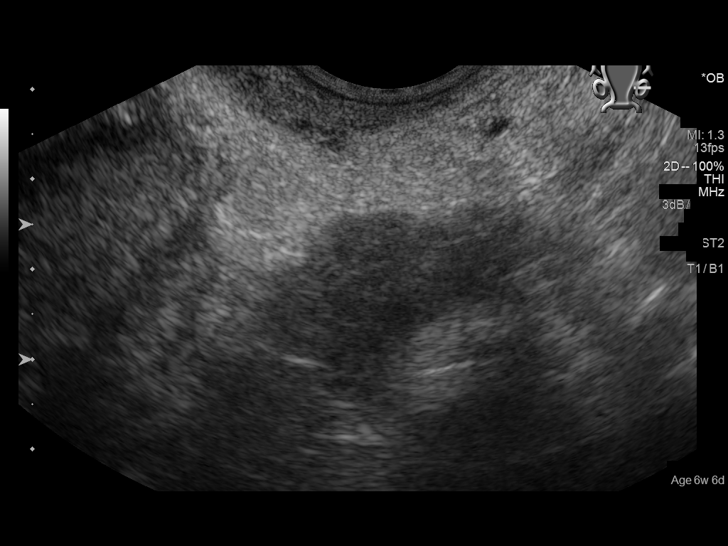

[13 of 25 positions shown; findings below may reference images not displayed]

FINDINGS: Intrauterine gestational sac: Not visualized

Yolk sac:  Not visualized

Embryo:  Not visualized

Cardiac Activity: Not visualized

Maternal uterus/adnexae: Left ovary is within normal limits
measuring 3.3 x 1.9 by 2.3 cm. The right ovary measures 3.1 x 1.8 x
3.8 cm. Slightly superior to the right ovary is a heterogenous hyper
and hypoechoic masslike area measuring 2.9 x 2.2 by 3 cm. Minimal
peripheral vascularity is present.

Pulsed Doppler evaluation of both ovaries demonstrates normal
appearing low-resistance arterial and venous waveforms.
IMPRESSION: 1. No sonographic evidence for ovarian torsion
2. Previously suggested gestational sac within the uterus is not
identified on the current exam. There is a heterogenous 3 cm hyper
and hypoechoic masslike area that appears separate from the right
ovary. In the setting of increasing HCG, and nonvisualization of
intrauterine pregnancy, this is concerning for possible ectopic
pregnancy.
Critical Value/emergent results were called by telephone at the time
of interpretation on 11/24/2016 at [DATE] to Dr. Ewenam, who
verbally acknowledged these results.

## 2018-04-28 ENCOUNTER — Encounter: Payer: Self-pay | Admitting: Emergency Medicine

## 2018-04-28 ENCOUNTER — Emergency Department
Admission: EM | Admit: 2018-04-28 | Discharge: 2018-04-28 | Disposition: A | Discharge status: Home / Self Care | Payer: Self-pay | Attending: Emergency Medicine | Admitting: Emergency Medicine

## 2018-04-28 ENCOUNTER — Other Ambulatory Visit: Payer: Self-pay

## 2018-04-28 DIAGNOSIS — F17210 Nicotine dependence, cigarettes, uncomplicated: Secondary | ICD-10-CM | POA: Insufficient documentation

## 2018-04-28 DIAGNOSIS — Z79899 Other long term (current) drug therapy: Secondary | ICD-10-CM | POA: Insufficient documentation

## 2018-04-28 DIAGNOSIS — J020 Streptococcal pharyngitis: Secondary | ICD-10-CM | POA: Insufficient documentation

## 2018-04-28 DIAGNOSIS — E039 Hypothyroidism, unspecified: Secondary | ICD-10-CM | POA: Insufficient documentation

## 2018-04-28 LAB — MONONUCLEOSIS SCREEN: Mono Screen: NEGATIVE

## 2018-04-28 LAB — GROUP A STREP BY PCR: Group A Strep by PCR: DETECTED — AB

## 2018-04-28 MED ORDER — AZITHROMYCIN 250 MG PO TABS: ORAL_TABLET | ORAL | 0 refills | Status: DC

## 2018-04-28 NOTE — Discharge Instructions (Addendum)
Follow-up with your regular doctor or the ENT doctors if you are not better in 3 days.  Discard your toothbrush in 3 days.  Gargle with warm salt water.  Return emergency department if worsening.

## 2018-04-28 NOTE — ED Provider Notes (Signed)
[facility] Emergency Department Provider Note  ____________________________________________   First MD Initiated Contact with Patient 04/28/18 1356     (approximate)  I have reviewed the triage vital signs and the nursing notes.   HISTORY  Chief Complaint Sore Throat    HPI Claire Frederick is a 27 y.o. female presents emergency department complaining of a sore throat.  She states she has had strep throat x4 in the last 6 months.  She keeps having it in the [facility] told her that she should have her tonsils removed.  Her mother is concerned that she has rheumatic fever.  She denies any cough or congestion.  Denies chest pain/shortness of breath.  She denies any weakness.  She denies a rash  Past Medical History:  Diagnosis Date  . Anxiety   . Bipolar disorder (HCC)   . Depression   . Ectopic pregnancy 11/24/2016  . Endometriosis   . Hypothyroidism     Patient Active Problem List   Diagnosis Date Noted  . Bipolar 2 disorder, major depressive episode (HCC) 01/14/2017  . Ectopic pregnancy 11/24/2016    Past Surgical History:  Procedure Laterality Date  . LAPAROSCOPIC UNILATERAL SALPINGECTOMY Right 11/30/2016   Procedure: LAPAROSCOPIC UNILATERAL SALPINGECTOMY WITH ECTOPIC;  Surgeon: Conard Novak, MD;  Location: [facility] ORS;  Service: Gynecology;  Laterality: Right;  . NO PAST SURGERIES    . TUBAL LIGATION      Prior to Admission medications   Medication Sig Start Date End Date Taking? Authorizing Provider  acetaminophen (TYLENOL) 325 MG tablet Take 1 tablet (325 mg total) by mouth every 6 (six) hours as needed for moderate pain. 01/19/17   Armandina Stammer I, NP  ARIPiprazole (ABILIFY) 5 MG tablet Take 1 tablet (5 mg total) by mouth daily. For mood control 01/20/17   Armandina Stammer I, NP  azithromycin (ZITHROMAX Z-PAK) 250 MG tablet 2 pills today then 1 pill a day for 4 days 04/28/18   Sherrie Mustache Roselyn Bering, PA-C  gabapentin (NEURONTIN) 300 MG capsule Take 1 capsule  (300 mg total) by mouth 3 (three) times daily. For agitation 01/19/17   Armandina Stammer I, NP  hydrOXYzine (ATARAX/VISTARIL) 25 MG tablet Take 1 tablet (25 mg total) by mouth every 6 (six) hours as needed for anxiety. 01/19/17   Armandina Stammer I, NP  ibuprofen (ADVIL,MOTRIN) 200 MG tablet Take 2 tablets (400 mg total) by mouth every 6 (six) hours as needed for moderate pain. 01/19/17   Armandina Stammer I, NP  loratadine (CLARITIN) 10 MG tablet Take 1 tablet (10 mg total) by mouth daily. For allergies 01/19/17   Armandina Stammer I, NP  nicotine (NICODERM CQ - DOSED IN MG/24 HOURS) 21 mg/24hr patch Place 1 patch (21 mg total) onto the skin daily. For smoking cessation 01/20/17   Armandina Stammer I, NP  traZODone (DESYREL) 100 MG tablet Take 1 tablet (100 mg total) by mouth at bedtime. For sleep 01/19/17   Armandina Stammer I, NP    Allergies Patient has no known allergies.  Family History  Problem Relation Age of Onset  . Drug abuse Mother   . Diabetes Father   . Hypercholesterolemia Father     Social History Social History   Tobacco Use  . Smoking status: Current Every Day Smoker    Packs/day: 0.50    Types: Cigarettes  . Smokeless tobacco: Never Used  Substance Use Topics  . Alcohol use: Yes    Comment: socially  . Drug use: Yes  Types: Marijuana    Review of Systems  Constitutional: No fever/chills Eyes: No visual changes. ENT: Positive sore throat. Respiratory: Denies cough Genitourinary: Negative for dysuria. Musculoskeletal: Negative for back pain. Skin: Negative for rash.    ____________________________________________   PHYSICAL EXAM:  VITAL SIGNS: ED Triage Vitals  Enc Vitals Group     BP 04/28/18 1337 115/76     Pulse Rate 04/28/18 1337 93     Resp 04/28/18 1337 16     Temp 04/28/18 1337 98.7 F (37.1 C)     Temp Source 04/28/18 1337 Oral     SpO2 04/28/18 1337 97 %     Weight 04/28/18 1337 225 lb (102.1 kg)     Height 04/28/18 1337 5\' 4"  (1.626 m)     Head Circumference --       Peak Flow --      Pain Score 04/28/18 1336 8     Pain Loc --      Pain Edu? --      Excl. in GC? --     Constitutional: Alert and oriented. Well appearing and in no acute distress. Eyes: Conjunctivae are normal.  Head: Atraumatic. Nose: No congestion/rhinnorhea. Mouth/Throat: Mucous membranes are moist.  Throat is red and swollen Neck: Supple, no lymphadenopathy is noted heart sounds are normal Cardiovascular: Normal rate, regular rhythm. Respiratory: Normal respiratory effort.  No retractions lungs are clear to auscultation GU: deferred Musculoskeletal: FROM all extremities, warm and well perfused Neurologic:  Normal speech and language.  Skin:  Skin is warm, dry and intact. No rash noted. Psychiatric: Mood and affect are normal. Speech and behavior are normal.  ____________________________________________   LABS (all labs ordered are listed, but only abnormal results are displayed)  Labs Reviewed  GROUP A STREP BY PCR - Abnormal; Notable for the following components:      Result Value   Group A Strep by PCR DETECTED (*)    All other components within normal limits  MONONUCLEOSIS SCREEN   ____________________________________________   ____________________________________________  RADIOLOGY    ____________________________________________   PROCEDURES  Procedure(s) performed: No  Procedures    ____________________________________________   INITIAL IMPRESSION / ASSESSMENT AND PLAN / ED COURSE  Pertinent labs & imaging results that were available during my care of the patient were reviewed by me and considered in my medical decision making (see chart for details).  Patient is a 27 year old female presents emergency department complaining of a sore throat.  States she has had multiple episodes of strep throat in the last 6 months.  She says she felt warm at home.  She does not know if she had an actual fever.  She took Tylenol prior to arrival.  On physical  exam the throat is red and swollen tonsillar glands are tender.  Remainder the exam is unremarkable  Strep test and mono test ordered   Mono test is negative.  Strep test is positive.  Discussed the test results with patient.  She has been on amoxicillin x4 for the strep throat in the past 6 months.  We discussed using an alternative medication.  Give her a trial run on Zithromax.  If she is worsening she needs to return and have a medication reevaluation.  She states she understands will comply with our instructions.  She is discharged in stable condition.  She is to discard her toothbrush in 3 days.  As part of my medical decision making, I reviewed the following data within the electronic MEDICAL RECORD NUMBER  Nursing notes reviewed and incorporated, Labs reviewed strep test is positive, mono was negative, Old chart reviewed, Notes from prior ED visits and [place] Controlled Substance Database  ____________________________________________   FINAL CLINICAL IMPRESSION(S) / ED DIAGNOSES  Final diagnoses:  Acute streptococcal pharyngitis      NEW MEDICATIONS STARTED DURING THIS VISIT:  New Prescriptions   AZITHROMYCIN (ZITHROMAX Z-PAK) 250 MG TABLET    2 pills today then 1 pill a day for 4 days     Note:  This document was prepared using Dragon voice recognition software and may include unintentional dictation errors.    Faythe Ghee, PA-C 04/28/18 1539    Don Perking, [place], MD 04/29/18 1258

## 2018-04-28 NOTE — ED Notes (Signed)
Pt reports sore throat - she states she had strep throat last month - pt reports strep throat x4 in the last 6 months - [facility] recommended to have tonsils removed but she does not have insurance

## 2018-04-28 NOTE — ED Triage Notes (Signed)
Pt reports sore throat x 1.5 weeks. Pt states when she swallows she feels like she is swallowing glass.  Pt states she is also having generalized body aches.

## 2018-09-25 ENCOUNTER — Ambulatory Visit: Payer: Self-pay | Admitting: Adult Health

## 2018-10-23 ENCOUNTER — Ambulatory Visit: Payer: Self-pay | Admitting: Adult Health

## 2018-11-11 IMAGING — DX DG CHEST 1V
1 series · 1 of 1 positions shown · non-contrast
Comparison: 07/06/2007

CLINICAL DATA: Dyspnea all day, awakened tonight gasping for air.

EXAM:
CHEST 1 VIEW

[chest ap]
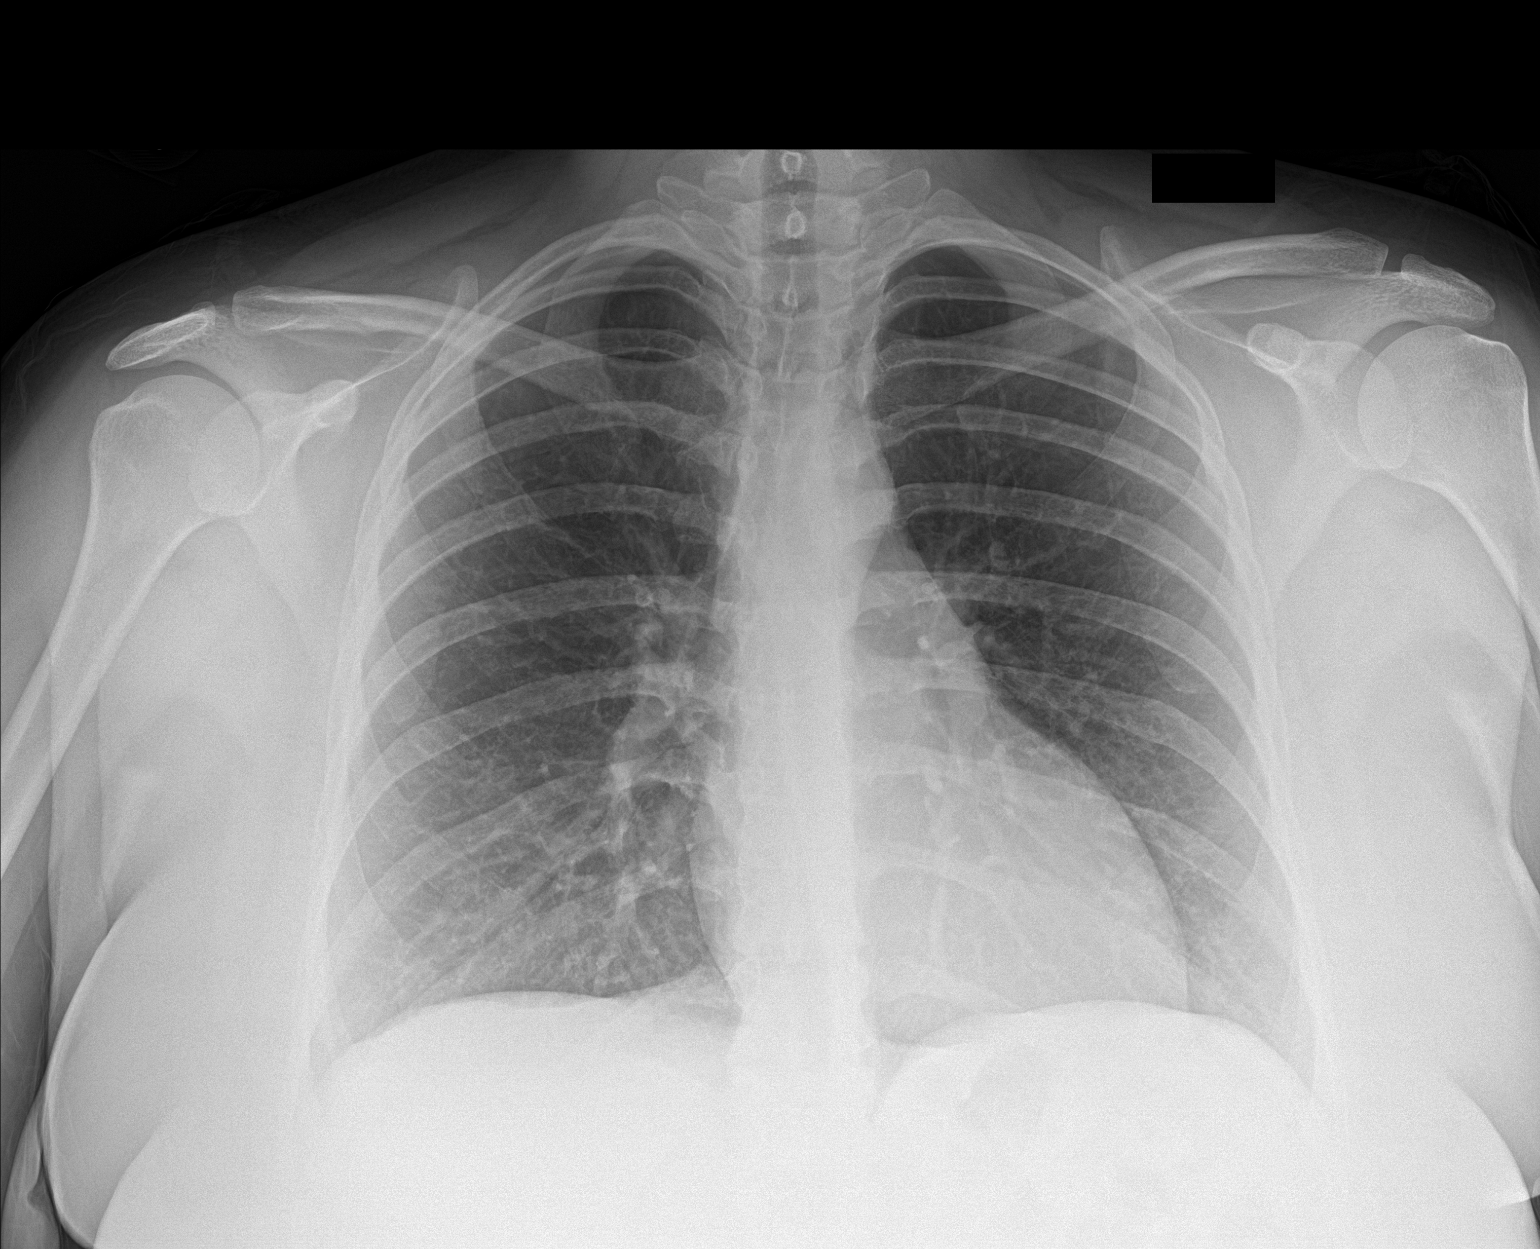

[1 of 1 positions shown; findings below may reference images not displayed]

FINDINGS: A single AP portable view of the chest demonstrates no focal
airspace consolidation or alveolar edema. The lungs are grossly
clear. There is no large effusion or pneumothorax. Cardiac and
mediastinal contours appear unremarkable.
IMPRESSION: No active disease.

## 2020-01-31 ENCOUNTER — Telehealth: Payer: Self-pay | Admitting: Obstetrics and Gynecology

## 2020-01-31 NOTE — Telephone Encounter (Signed)
Patient is schedule for 02/16/20 at the [facility] location with SDJ 10:30 for nexplanon replacement

## 2020-01-31 NOTE — Telephone Encounter (Signed)
Noted  

## 2020-02-04 NOTE — Telephone Encounter (Signed)
Nexplanon reserved in [place] for this patient.

## 2020-02-16 ENCOUNTER — Other Ambulatory Visit: Payer: Self-pay

## 2020-02-16 ENCOUNTER — Encounter: Payer: Self-pay | Admitting: Obstetrics and Gynecology

## 2020-02-16 ENCOUNTER — Other Ambulatory Visit (HOSPITAL_COMMUNITY)
Admission: RE | Admit: 2020-02-16 | Discharge: 2020-02-16 | Disposition: A | Discharge status: Home / Self Care | Payer: Medicaid Other | Source: Ambulatory Visit | Attending: Obstetrics and Gynecology | Admitting: Obstetrics and Gynecology

## 2020-02-16 ENCOUNTER — Ambulatory Visit (INDEPENDENT_AMBULATORY_CARE_PROVIDER_SITE_OTHER): Payer: Medicaid Other | Admitting: Obstetrics and Gynecology

## 2020-02-16 VITALS — BP 106/69 | HR 64 | Ht 64.0 in | Wt 243.0 lb

## 2020-02-16 DIAGNOSIS — Z3046 Encounter for surveillance of implantable subdermal contraceptive: Secondary | ICD-10-CM | POA: Diagnosis not present

## 2020-02-16 DIAGNOSIS — Z124 Encounter for screening for malignant neoplasm of cervix: Secondary | ICD-10-CM | POA: Insufficient documentation

## 2020-02-16 DIAGNOSIS — Z113 Encounter for screening for infections with a predominantly sexual mode of transmission: Secondary | ICD-10-CM | POA: Insufficient documentation

## 2020-02-16 DIAGNOSIS — Z0000 Encounter for general adult medical examination without abnormal findings: Secondary | ICD-10-CM

## 2020-02-16 DIAGNOSIS — Z1339 Encounter for screening examination for other mental health and behavioral disorders: Secondary | ICD-10-CM

## 2020-02-16 DIAGNOSIS — Z01419 Encounter for gynecological examination (general) (routine) without abnormal findings: Secondary | ICD-10-CM | POA: Insufficient documentation

## 2020-02-16 DIAGNOSIS — Z1331 Encounter for screening for depression: Secondary | ICD-10-CM

## 2020-02-16 MED ORDER — NEXPLANON 68 MG ~~LOC~~ IMPL: 1.0000 | DRUG_IMPLANT | Freq: Once | SUBCUTANEOUS | 0 refills | Status: DC
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
## 2020-02-16 NOTE — Patient Instructions (Signed)
Nexplanon Instructions After Insertion  Keep bandage clean and dry for 24 hours  May use ice/Tylenol/Ibuprofen for soreness or pain  If you develop fever, drainage or increased warmth from incision site-contact office immediately   

## 2020-02-21 LAB — CYTOLOGY - PAP
Chlamydia: NEGATIVE
Comment: NEGATIVE
Comment: NORMAL
Diagnosis: NEGATIVE
Neisseria Gonorrhea: NEGATIVE

## 2020-02-24 ENCOUNTER — Telehealth: Payer: Self-pay | Admitting: Obstetrics and Gynecology

## 2020-02-24 ENCOUNTER — Other Ambulatory Visit: Payer: Self-pay | Admitting: Obstetrics and Gynecology

## 2020-02-24 DIAGNOSIS — A599 Trichomoniasis, unspecified: Secondary | ICD-10-CM

## 2020-02-24 MED ORDER — METRONIDAZOLE 500 MG PO TABS: 2000.0000 mg | ORAL_TABLET | Freq: Once | ORAL | 0 refills | Status: AC

## 2020-02-24 NOTE — Telephone Encounter (Signed)
Left generic Vm

## 2020-02-24 NOTE — Telephone Encounter (Signed)
Called and left voice mail for patient to call back to be schedule °

## 2020-04-14 ENCOUNTER — Encounter: Payer: Self-pay | Admitting: Obstetrics and Gynecology

## 2020-04-14 ENCOUNTER — Other Ambulatory Visit (HOSPITAL_COMMUNITY)
Admission: RE | Admit: 2020-04-14 | Discharge: 2020-04-14 | Disposition: A | Discharge status: Home / Self Care | Payer: Medicaid Other | Source: Ambulatory Visit | Attending: Obstetrics and Gynecology | Admitting: Obstetrics and Gynecology

## 2020-04-14 ENCOUNTER — Other Ambulatory Visit: Payer: Self-pay

## 2020-04-14 ENCOUNTER — Ambulatory Visit (INDEPENDENT_AMBULATORY_CARE_PROVIDER_SITE_OTHER): Payer: Medicaid Other | Admitting: Obstetrics and Gynecology

## 2020-04-14 VITALS — BP 122/74 | Ht 64.0 in | Wt 230.0 lb

## 2020-04-14 DIAGNOSIS — A599 Trichomoniasis, unspecified: Secondary | ICD-10-CM

## 2020-04-14 DIAGNOSIS — Z202 Contact with and (suspected) exposure to infections with a predominantly sexual mode of transmission: Secondary | ICD-10-CM
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
## 2020-04-15 LAB — HIV ANTIBODY (ROUTINE TESTING W REFLEX): HIV Screen 4th Generation wRfx: NONREACTIVE

## 2020-04-15 LAB — RPR QUALITATIVE: RPR Ser Ql: NONREACTIVE

## 2020-04-19 LAB — CERVICOVAGINAL ANCILLARY ONLY
Chlamydia: NEGATIVE
Comment: NEGATIVE
Comment: NEGATIVE
Comment: NORMAL
Neisseria Gonorrhea: NEGATIVE
Trichomonas: NEGATIVE

## 2020-07-20 ENCOUNTER — Encounter: Payer: Self-pay | Admitting: Emergency Medicine

## 2020-07-20 ENCOUNTER — Other Ambulatory Visit: Payer: Self-pay

## 2020-07-20 ENCOUNTER — Emergency Department
Admission: EM | Admit: 2020-07-20 | Discharge: 2020-07-20 | Disposition: A | Discharge status: Home / Self Care | Payer: Medicaid Other | Attending: Emergency Medicine | Admitting: Emergency Medicine

## 2020-07-20 DIAGNOSIS — Z87891 Personal history of nicotine dependence: Secondary | ICD-10-CM | POA: Diagnosis not present

## 2020-07-20 DIAGNOSIS — E039 Hypothyroidism, unspecified: Secondary | ICD-10-CM | POA: Insufficient documentation

## 2020-07-20 DIAGNOSIS — K2970 Gastritis, unspecified, without bleeding: Secondary | ICD-10-CM

## 2020-07-20 DIAGNOSIS — Z79899 Other long term (current) drug therapy: Secondary | ICD-10-CM | POA: Insufficient documentation

## 2020-07-20 DIAGNOSIS — R1013 Epigastric pain: Secondary | ICD-10-CM | POA: Diagnosis present

## 2020-07-20 LAB — CBC
HCT: 38.7 % (ref 36.0–46.0)
Hemoglobin: 13.1 g/dL (ref 12.0–15.0)
MCH: 30.5 pg (ref 26.0–34.0)
MCHC: 33.9 g/dL (ref 30.0–36.0)
MCV: 90 fL (ref 80.0–100.0)
Platelets: 244 10*3/uL (ref 150–400)
RBC: 4.3 MIL/uL (ref 3.87–5.11)
RDW: 13.3 % (ref 11.5–15.5)
WBC: 9.7 10*3/uL (ref 4.0–10.5)
nRBC: 0 % (ref 0.0–0.2)

## 2020-07-20 LAB — COMPREHENSIVE METABOLIC PANEL
ALT: 11 U/L (ref 0–44)
AST: 16 U/L (ref 15–41)
Albumin: 4.2 g/dL (ref 3.5–5.0)
Alkaline Phosphatase: 63 U/L (ref 38–126)
Anion gap: 9 (ref 5–15)
BUN: 5 mg/dL — ABNORMAL LOW (ref 6–20)
CO2: 23 mmol/L (ref 22–32)
Calcium: 9 mg/dL (ref 8.9–10.3)
Chloride: 107 mmol/L (ref 98–111)
Creatinine, Ser: 0.78 mg/dL (ref 0.44–1.00)
GFR calc Af Amer: >60 mL/min (ref >60)
GFR calc non Af Amer: >60 mL/min (ref >60)
Glucose, Bld: 93 mg/dL (ref 70–99)
Potassium: 3.5 mmol/L (ref 3.5–5.1)
Sodium: 139 mmol/L (ref 135–145)
Total Bilirubin: 0.8 mg/dL (ref 0.3–1.2)
Total Protein: 7.6 g/dL (ref 6.5–8.1)

## 2020-07-20 LAB — URINALYSIS, COMPLETE (UACMP) WITH MICROSCOPIC
Bilirubin Urine: NEGATIVE
Glucose, UA: NEGATIVE mg/dL
Hgb urine dipstick: NEGATIVE
Ketones, ur: NEGATIVE mg/dL
Leukocytes,Ua: NEGATIVE
Nitrite: NEGATIVE
Protein, ur: NEGATIVE mg/dL
Specific Gravity, Urine: 1.006 (ref 1.005–1.030)
pH: 6 (ref 5.0–8.0)

## 2020-07-20 LAB — LIPASE, BLOOD: Lipase: 33 U/L (ref 11–51)

## 2020-07-20 LAB — POCT PREGNANCY, URINE: Preg Test, Ur: NEGATIVE

## 2020-07-20 MED ORDER — SUCRALFATE 1 G PO TABS: 1.0000 g | ORAL_TABLET | Freq: Four times a day (QID) | ORAL | 1 refills | Status: DC

## 2020-07-20 MED ORDER — PANTOPRAZOLE SODIUM 40 MG PO TBEC: 40.0000 mg | DELAYED_RELEASE_TABLET | Freq: Every day | ORAL | 1 refills | Status: DC

## 2020-07-20 NOTE — ED Triage Notes (Signed)
C/o history of stomach ulcers and arrives today with c/o pain.  States symptoms have been ongoing for several months.  Patient referred to ED for an ultrasound for concerns of ulcers rupturing.  AAOx3.  Skin warm and dry. NAD

## 2020-07-20 NOTE — ED Provider Notes (Signed)
[facility] Emergency Department Provider Note   ____________________________________________    I have reviewed the triage vital signs and the nursing notes.   HISTORY  Chief Complaint Epigastric pain    HPI Claire Frederick is a 29 y.o. female with a history as noted below who reports a history of stomach ulcers presents today with complaints of epigastric pain.  Patient reports he has never had an endoscopy but was told that she likely has ulcers by her PCP.  Apparently called her PCPs office because of epigastric discomfort today and was referred to the emergency department.  Patient reports this pain is been ongoing for several months, she does report some weight loss because of burning sensation in her epigastrium, particularly when lying down in the evening.  She does take Prilosec.  Normal stools  Past Medical History:  Diagnosis Date  . Anxiety   . Bipolar disorder ([facility])   . Depression   . Ectopic pregnancy 11/24/2016  . Hypothyroidism     Patient Active Problem List   Diagnosis Date Noted  . Bipolar 2 disorder, major depressive episode ([facility]) 01/14/2017  . Ectopic pregnancy 11/24/2016    Past Surgical History:  Procedure Laterality Date  . LAPAROSCOPIC UNILATERAL SALPINGECTOMY Right 11/30/2016   Procedure: LAPAROSCOPIC UNILATERAL SALPINGECTOMY WITH ECTOPIC;  Surgeon: Conard Novak, MD;  Location: [facility] ORS;  Service: Gynecology;  Laterality: Right;    Prior to Admission medications   Medication Sig Start Date End Date Taking? Authorizing Provider  acetaminophen (TYLENOL) 325 MG tablet Take 1 tablet (325 mg total) by mouth every 6 (six) hours as needed for moderate pain. 01/19/17   Armandina Stammer I, NP  etonogestrel (NEXPLANON) 68 MG IMPL implant 1 each (68 mg total) by Subdermal route once for 1 dose. 02/16/20 02/16/20  Conard Novak, MD  loratadine (CLARITIN) 10 MG tablet Take 1 tablet (10 mg total) by mouth daily. For allergies  01/19/17   Armandina Stammer I, NP  pantoprazole (PROTONIX) 40 MG tablet Take 1 tablet (40 mg total) by mouth daily. 07/20/20 07/20/21  Jene Every, MD  sucralfate (CARAFATE) 1 g tablet Take 1 tablet (1 g total) by mouth 4 (four) times daily. 07/20/20 08/19/20  Jene Every, MD     Allergies Patient has no known allergies.  Family History  Problem Relation Age of Onset  . Drug abuse Mother   . Diabetes Father   . Hypercholesterolemia Father   . Diabetes Paternal Uncle     Social History Social History   Tobacco Use  . Smoking status: Former Smoker    Packs/day: 0.50    Types: Cigarettes    Quit date: 11/18/2019    Years since quitting: 0.6  . Smokeless tobacco: Never Used  Vaping Use  . Vaping Use: Never used  Substance Use Topics  . Alcohol use: Yes    Comment: socially  . Drug use: Yes    Types: Marijuana    Review of Systems  Constitutional: No fever/chills Eyes: No visual changes.  ENT: No sore throat. Cardiovascular: Denies chest pain. Respiratory: Denies shortness of breath. Gastrointestinal: As above Genitourinary: Negative for dysuria. Musculoskeletal: Negative for back pain. Skin: Negative for rash. Neurological: Negative for headaches   ____________________________________________   PHYSICAL EXAM:  VITAL SIGNS: ED Triage Vitals  Enc Vitals Group     BP 07/20/20 1007 (!) 158/83     Pulse Rate 07/20/20 1007 61     Resp 07/20/20 1007 18  Temp 07/20/20 1007 97.9 F (36.6 C)     Temp Source 07/20/20 1007 Oral     SpO2 07/20/20 1007 100 %     Weight 07/20/20 0859 104.3 kg (229 lb 15 oz)     Height 07/20/20 0859 1.626 m (5\' 4" )     Head Circumference --      Peak Flow --      Pain Score 07/20/20 0859 5     Pain Loc --      Pain Edu? --      Excl. in GC? --     Constitutional: Alert and oriented. No acute distress.   Nose: No congestion/rhinnorhea. Mouth/Throat: Mucous membranes are moist.    Cardiovascular: Normal rate, regular rhythm.  Good  peripheral circulation. Respiratory: Normal respiratory effort.  No retractions. Lungs CTAB. Gastrointestinal: Soft and nontender. No distention.  No CVA tenderness.  Reassuring exam  Musculoskeletal:   Warm and well perfused Neurologic:  Normal speech and language. No gross focal neurologic deficits are appreciated.  Skin:  Skin is warm, dry and intact. No rash noted. Psychiatric: Mood and affect are normal. Speech and behavior are normal.  ____________________________________________   LABS (all labs ordered are listed, but only abnormal results are displayed)  Labs Reviewed  COMPREHENSIVE METABOLIC PANEL - Abnormal; Notable for the following components:      Result Value   BUN 5 (*)    All other components within normal limits  URINALYSIS, COMPLETE (UACMP) WITH MICROSCOPIC - Abnormal; Notable for the following components:   Color, Urine STRAW (*)    APPearance CLEAR (*)    Bacteria, UA RARE (*)    All other components within normal limits  LIPASE, BLOOD  CBC  POC URINE PREG, ED   ____________________________________________  EKG  None ____________________________________________  RADIOLOGY   ____________________________________________   PROCEDURES  Procedure(s) performed: No  Procedures   Critical Care performed: No ____________________________________________   INITIAL IMPRESSION / ASSESSMENT AND PLAN / ED COURSE  Pertinent labs & imaging results that were available during my care of the patient were reviewed by me and considered in my medical decision making (see chart for details).  Patient presents with epigastric discomfort for several months as described above.  Differential includes gastritis/PUD, not consistent with pancreatitis, no history of alcohol abuse or cigarette smoking.  Doubt colitis.  Lab work today is quite reassuring, normal white blood cell count.  Normal hemoglobin.  Normal CMP, no history of abdominal surgeries.  No right upper  quadrant tenderness to palpation.  Suspect gastritis/PUD, will transition to Protonix and Carafate, refer to GI for likely endoscopy further work-up    ____________________________________________   FINAL CLINICAL IMPRESSION(S) / ED DIAGNOSES  Final diagnoses:  Gastritis without bleeding, unspecified chronicity, unspecified gastritis type        Note:  This document was prepared using Dragon voice recognition software and may include unintentional dictation errors.   09/19/20, MD 07/20/20 [phone]

## 2020-07-21 ENCOUNTER — Telehealth: Payer: Self-pay | Admitting: Gastroenterology

## 2020-07-21 NOTE — Telephone Encounter (Signed)
Pt seen in ED on 9.2.21 (in Epic) and advised to have EGD with Dr. Servando Snare for suspect gastritis without bleeding and possible ulcers. Dr. Annabell Sabal next available ov is 10.13.21. Please advise on wether to schedule pt for ov or if pt should be sch for procedure first. Pt advised that Dr. Annabell Sabal nurse will be out till next Wed 9.8.21.

## 2020-07-21 NOTE — Telephone Encounter (Signed)
Ginger, it would be fine to set the patient up for an EGD to help expedite things.

## 2020-10-16 ENCOUNTER — Other Ambulatory Visit: Payer: Self-pay

## 2020-10-16 ENCOUNTER — Encounter: Payer: Self-pay | Admitting: Gastroenterology

## 2020-10-16 ENCOUNTER — Ambulatory Visit (INDEPENDENT_AMBULATORY_CARE_PROVIDER_SITE_OTHER): Payer: Medicaid Other | Admitting: Gastroenterology

## 2020-10-16 VITALS — BP 115/77 | HR 103 | Ht 64.0 in | Wt 212.6 lb

## 2020-10-16 DIAGNOSIS — R112 Nausea with vomiting, unspecified: Secondary | ICD-10-CM | POA: Diagnosis not present

## 2020-10-16 DIAGNOSIS — R1013 Epigastric pain: Secondary | ICD-10-CM | POA: Diagnosis not present

## 2020-10-16 DIAGNOSIS — G8929 Other chronic pain: Secondary | ICD-10-CM

## 2020-10-16 NOTE — H&P (View-Only) (Signed)
° ° °Gastroenterology Consultation ° °Referring Provider:     Dr. Kinner °Primary Care Physician:  Wells, Cheryl, FNP °Primary Gastroenterologist:  Dr. Taji Sather     °Reason for Consultation:     Abdominal pain °      ° HPI:   °Claire Frederick is a 29 y.o. y/o female referred for consultation & management of abdominal pain by Dr. Wells, Cheryl, FNP.  This patient comes in today after being seen on September 3 of this year in the ER.  At that time the patient was reporting some abdominal pain with pain in the epigastric area made worse with greasy and fatty foods.  She also states that it felt like there was pressure in her chest and her result of her ER visit was that she was given Protonix and Carafate.  The patient states that she has had minimal improvement from that.  She reports that she was told in the ER that she may have peptic ulcer disease and should follow-up with me for an upper endoscopy.  There is no report of any black stools or bloody stools but the patient does state that she is under a lot of stress and has frequent diarrhea.  She states that she has a 8-year-old special needs child and she is a single parent. °The patient also reports that she has lost 30 pounds over the last 6 months.  There is no history of any colon cancer or colon polyps in any first-degree relatives but she does report a grandfather with irritable bowel syndrome. °The patient reports that she has her abdomen becomes distended after eating quite commonly.  She states that it happened when she went to the emergency room.  The patient also reports that associated with her abdominal pain is some nausea and vomiting.  She does also report intermittent dark-colored stools. ° °Past Medical History:  °Diagnosis Date  °• Anxiety   °• Bipolar disorder ([facility])   °• Depression   °• Ectopic pregnancy 11/24/2016  °• Hypothyroidism   ° ° °Past Surgical History:  °Procedure Laterality Date  °• LAPAROSCOPIC UNILATERAL SALPINGECTOMY Right 11/30/2016   ° Procedure: LAPAROSCOPIC UNILATERAL SALPINGECTOMY WITH ECTOPIC;  Surgeon: Stephen D Jackson, MD;  Location: [facility] ORS;  Service: Gynecology;  Laterality: Right;  ° ° °Prior to Admission medications   °Medication Sig Start Date End Date Taking? Authorizing Provider  °acetaminophen (TYLENOL) 325 MG tablet Take 1 tablet (325 mg total) by mouth every 6 (six) hours as needed for moderate pain. 01/19/17  Yes Nwoko, Agnes I, NP  °FLUoxetine (PROZAC) 20 MG capsule Take by mouth. 06/24/19  Yes [provider]  °hydrOXYzine (ATARAX/VISTARIL) 25 MG tablet Take by mouth.   Yes [provider]  °loratadine (CLARITIN) 10 MG tablet Take 1 tablet (10 mg total) by mouth daily. For allergies 01/19/17  Yes Nwoko, Agnes I, NP  °ondansetron (ZOFRAN) 4 MG tablet Take by mouth.   Yes [provider]  °pantoprazole (PROTONIX) 40 MG tablet Take 1 tablet (40 mg total) by mouth daily. 07/20/20 07/20/21 Yes Kinner, Robert, MD  °etonogestrel (NEXPLANON) 68 MG IMPL implant 1 each (68 mg total) by Subdermal route once for 1 dose. 02/16/20 02/16/20  Jackson, Stephen D, MD  °sucralfate (CARAFATE) 1 g tablet Take 1 tablet (1 g total) by mouth 4 (four) times daily. 07/20/20 08/19/20  Kinner, Robert, MD  ° ° °Family History  °Problem Relation Age of Onset  °• Drug abuse Mother   °• Diabetes Father   °•   Hypercholesterolemia Father   °• Diabetes Paternal Uncle   °  ° °Social History  ° °Tobacco Use  °• Smoking status: Former Smoker  °  Packs/day: 0.50  °  Types: Cigarettes  °  Quit date: 11/18/2019  °  Years since quitting: 0.9  °• Smokeless tobacco: Never Used  °Vaping Use  °• Vaping Use: Never used  °Substance Use Topics  °• Alcohol use: Yes  °  Comment: socially  °• Drug use: Yes  °  Types: Marijuana  ° ° °Allergies as of 10/16/2020  °• (No Known Allergies)  ° ° °Review of Systems:    °All systems reviewed and negative except where noted in HPI. ° ° Physical Exam:  °BP 115/77    Pulse (!) 103    Ht 5' 4" (1.626 m)    Wt 212 lb 9.6 oz  (96.4 kg)    BMI 36.49 kg/m²  °No LMP recorded. Patient has had an implant. °General:   Alert,  Well-developed, well-nourished, pleasant and cooperative in NAD °Head:  Normocephalic and atraumatic. °Eyes:  Sclera clear, no icterus.   Conjunctiva pink. °Ears:  Normal auditory acuity. °Neck:  Supple; no masses or thyromegaly. °Lungs:  Respirations even and unlabored.  Clear throughout to auscultation.   No wheezes, crackles, or rhonchi. No acute distress. °Heart:  Regular rate and rhythm; no murmurs, clicks, rubs, or gallops. °Abdomen:  Normal bowel sounds.  No bruits.  Soft, non-tender and non-distended without masses, hepatosplenomegaly or hernias noted.  No guarding or rebound tenderness.  Negative Carnett sign.   °Rectal:  Deferred.  °Pulses:  Normal pulses noted. °Extremities:  No clubbing or edema.  No cyanosis. °Neurologic:  Alert and oriented x3;  grossly normal neurologically. °Skin:  Intact without significant lesions or rashes.  No jaundice. °Lymph Nodes:  No significant cervical adenopathy. °Psych:  Alert and cooperative. Normal mood and affect. ° °Imaging Studies: °No results found. ° °Assessment and Plan:  ° °Claire Frederick is a 29 y.o. y/o female who comes in today with a BMI of 36 and a report of a 30 pound weight loss over the last 6 months.  The patient states that she was told she may have a peptic ulcer disease in the ER when she was seen in September.  The patient has been on Protonix and Carafate with only minimal relief.  The patient will be set up for an upper endoscopy to rule out any upper GI pathology.  She has had some vomiting without any blood and states that she has been having dark stools.  She denies taking iron or Pepto-Bismol.  The patient also states that she has bloating after eating which is most likely consistent with irritable bowel syndrome and diarrhea predominance.  She will also be set up for a right upper quadrant ultrasound to assess the gallbladder and a gallbladder  emptying study.  The patient has been explained the plan and agrees with it. ° ° ° °Renardo Cheatum, MD. FACG ° ° ° Note: This dictation was prepared with Dragon dictation along with smaller phrase technology. Any transcriptional errors that result from this process are unintentional.   °

## 2020-10-16 NOTE — Progress Notes (Signed)
Gastroenterology Consultation  Referring Provider:     Dr. Cyril Loosen Primary Care Physician:  Marin Comment, FNP Primary Gastroenterologist:  Dr. Servando Snare     Reason for Consultation:     Abdominal pain        HPI:   Claire Frederick is a 29 y.o. y/o female referred for consultation & management of abdominal pain by Dr. Marin Comment, FNP.  This patient comes in today after being seen on September 3 of this year in the ER.  At that time the patient was reporting some abdominal pain with pain in the epigastric area made worse with greasy and fatty foods.  She also states that it felt like there was pressure in her chest and her result of her ER visit was that she was given Protonix and Carafate.  The patient states that she has had minimal improvement from that.  She reports that she was told in the ER that she may have peptic ulcer disease and should follow-up with me for an upper endoscopy.  There is no report of any black stools or bloody stools but the patient does state that she is under a lot of stress and has frequent diarrhea.  She states that she has a 28-year-old special needs child and she is a single parent. The patient also reports that she has lost 30 pounds over the last 6 months.  There is no history of any colon cancer or colon polyps in any first-degree relatives but she does report a grandfather with irritable bowel syndrome. The patient reports that she has her abdomen becomes distended after eating quite commonly.  She states that it happened when she went to the emergency room.  The patient also reports that associated with her abdominal pain is some nausea and vomiting.  She does also report intermittent dark-colored stools.  Past Medical History:  Diagnosis Date   Anxiety    Bipolar disorder (HCC)    Depression    Ectopic pregnancy 11/24/2016   Hypothyroidism     Past Surgical History:  Procedure Laterality Date   LAPAROSCOPIC UNILATERAL SALPINGECTOMY Right 11/30/2016    Procedure: LAPAROSCOPIC UNILATERAL SALPINGECTOMY WITH ECTOPIC;  Surgeon: Conard Novak, MD;  Location: [facility] ORS;  Service: Gynecology;  Laterality: Right;    Prior to Admission medications   Medication Sig Start Date End Date Taking? Authorizing Provider  acetaminophen (TYLENOL) 325 MG tablet Take 1 tablet (325 mg total) by mouth every 6 (six) hours as needed for moderate pain. 01/19/17  Yes Armandina Stammer I, NP  FLUoxetine (PROZAC) 20 MG capsule Take by mouth. 06/24/19  Yes [provider]  hydrOXYzine (ATARAX/VISTARIL) 25 MG tablet Take by mouth.   Yes [provider]  loratadine (CLARITIN) 10 MG tablet Take 1 tablet (10 mg total) by mouth daily. For allergies 01/19/17  Yes Nwoko, Nicole Kindred I, NP  ondansetron (ZOFRAN) 4 MG tablet Take by mouth.   Yes [provider]  pantoprazole (PROTONIX) 40 MG tablet Take 1 tablet (40 mg total) by mouth daily. 07/20/20 07/20/21 Yes Jene Every, MD  etonogestrel (NEXPLANON) 68 MG IMPL implant 1 each (68 mg total) by Subdermal route once for 1 dose. 02/16/20 02/16/20  Conard Novak, MD  sucralfate (CARAFATE) 1 g tablet Take 1 tablet (1 g total) by mouth 4 (four) times daily. 07/20/20 08/19/20  Jene Every, MD    Family History  Problem Relation Age of Onset   Drug abuse Mother    Diabetes Father  Hypercholesterolemia Father    Diabetes Paternal Uncle      Social History   Tobacco Use   Smoking status: Former Smoker    Packs/day: 0.50    Types: Cigarettes    Quit date: 11/18/2019    Years since quitting: 0.9   Smokeless tobacco: Never Used  Vaping Use   Vaping Use: Never used  Substance Use Topics   Alcohol use: Yes    Comment: socially   Drug use: Yes    Types: Marijuana    Allergies as of 10/16/2020   (No Known Allergies)    Review of Systems:    All systems reviewed and negative except where noted in HPI.   Physical Exam:  BP 115/77    Pulse (!) 103    Ht 5\' 4"  (1.626 m)    Wt 212 lb 9.6 oz  (96.4 kg)    BMI 36.49 kg/m  No LMP recorded. Patient has had an implant. General:   Alert,  Well-developed, well-nourished, pleasant and cooperative in NAD Head:  Normocephalic and atraumatic. Eyes:  Sclera clear, no icterus.   Conjunctiva pink. Ears:  Normal auditory acuity. Neck:  Supple; no masses or thyromegaly. Lungs:  Respirations even and unlabored.  Clear throughout to auscultation.   No wheezes, crackles, or rhonchi. No acute distress. Heart:  Regular rate and rhythm; no murmurs, clicks, rubs, or gallops. Abdomen:  Normal bowel sounds.  No bruits.  Soft, non-tender and non-distended without masses, hepatosplenomegaly or hernias noted.  No guarding or rebound tenderness.  Negative Carnett sign.   Rectal:  Deferred.  Pulses:  Normal pulses noted. Extremities:  No clubbing or edema.  No cyanosis. Neurologic:  Alert and oriented x3;  grossly normal neurologically. Skin:  Intact without significant lesions or rashes.  No jaundice. Lymph Nodes:  No significant cervical adenopathy. Psych:  Alert and cooperative. Normal mood and affect.  Imaging Studies: No results found.  Assessment and Plan:   Claire Frederick is a 29 y.o. y/o female who comes in today with a BMI of 36 and a report of a 30 pound weight loss over the last 6 months.  The patient states that she was told she may have a peptic ulcer disease in the ER when she was seen in September.  The patient has been on Protonix and Carafate with only minimal relief.  The patient will be set up for an upper endoscopy to rule out any upper GI pathology.  She has had some vomiting without any blood and states that she has been having dark stools.  She denies taking iron or Pepto-Bismol.  The patient also states that she has bloating after eating which is most likely consistent with irritable bowel syndrome and diarrhea predominance.  She will also be set up for a right upper quadrant ultrasound to assess the gallbladder and a gallbladder  emptying study.  The patient has been explained the plan and agrees with it.    October, MD. Midge Minium    Note: This dictation was prepared with Dragon dictation along with smaller phrase technology. Any transcriptional errors that result from this process are unintentional.

## 2020-10-17 ENCOUNTER — Encounter: Payer: Self-pay | Admitting: Gastroenterology

## 2020-10-17 ENCOUNTER — Other Ambulatory Visit
Admission: RE | Admit: 2020-10-17 | Discharge: 2020-10-17 | Disposition: A | Discharge status: Home / Self Care | Payer: Medicaid Other | Source: Ambulatory Visit | Attending: Gastroenterology | Admitting: Gastroenterology

## 2020-10-17 ENCOUNTER — Other Ambulatory Visit: Payer: Self-pay

## 2020-10-17 DIAGNOSIS — Z01812 Encounter for preprocedural laboratory examination: Secondary | ICD-10-CM | POA: Insufficient documentation

## 2020-10-17 DIAGNOSIS — Z20822 Contact with and (suspected) exposure to covid-19: Secondary | ICD-10-CM | POA: Insufficient documentation

## 2020-10-18 LAB — SARS CORONAVIRUS 2 (TAT 6-24 HRS): SARS Coronavirus 2: NEGATIVE

## 2020-10-18 NOTE — Discharge Instructions (Signed)
General Anesthesia, Adult, Care After This sheet gives you information about how to care for yourself after your procedure. Your health care provider may also give you more specific instructions. If you have problems or questions, contact your health care provider. What can I expect after the procedure? After the procedure, the following side effects are common:  Pain or discomfort at the IV site.  Nausea.  Vomiting.  Sore throat.  Trouble concentrating.  Feeling cold or chills.  Weak or tired.  Sleepiness and fatigue.  Soreness and body aches. These side effects can affect parts of the body that were not involved in surgery. Follow these instructions at home:  For at least 24 hours after the procedure:  Have a responsible adult stay with you. It is important to have someone help care for you until you are awake and alert.  Rest as needed.  Do not: ? Participate in activities in which you could fall or become injured. ? Drive. ? Use heavy machinery. ? Drink alcohol. ? Take sleeping pills or medicines that cause drowsiness. ? Make important decisions or sign legal documents. ? Take care of children on your own. Eating and drinking  Follow any instructions from your health care provider about eating or drinking restrictions.  When you feel hungry, start by eating small amounts of foods that are soft and easy to digest (bland), such as toast. Gradually return to your regular diet.  Drink enough fluid to keep your urine pale yellow.  If you vomit, rehydrate by drinking water, juice, or clear broth. General instructions  If you have sleep apnea, surgery and certain medicines can increase your risk for breathing problems. Follow instructions from your health care provider about wearing your sleep device: ? Anytime you are sleeping, including during daytime naps. ? While taking prescription pain medicines, sleeping medicines, or medicines that make you drowsy.  Return to  your normal activities as told by your health care provider. Ask your health care provider what activities are safe for you.  Take over-the-counter and prescription medicines only as told by your health care provider.  If you smoke, do not smoke without supervision.  Keep all follow-up visits as told by your health care provider. This is important. Contact a health care provider if:  You have nausea or vomiting that does not get better with medicine.  You cannot eat or drink without vomiting.  You have pain that does not get better with medicine.  You are unable to pass urine.  You develop a skin rash.  You have a fever.  You have redness around your IV site that gets worse. Get help right away if:  You have difficulty breathing.  You have chest pain.  You have blood in your urine or stool, or you vomit blood. Summary  After the procedure, it is common to have a sore throat or nausea. It is also common to feel tired.  Have a responsible adult stay with you for the first 24 hours after general anesthesia. It is important to have someone help care for you until you are awake and alert.  When you feel hungry, start by eating small amounts of foods that are soft and easy to digest (bland), such as toast. Gradually return to your regular diet.  Drink enough fluid to keep your urine pale yellow.  Return to your normal activities as told by your health care provider. Ask your health care provider what activities are safe for you. This information is not   intended to replace advice given to you by your health care provider. Make sure you discuss any questions you have with your health care provider. Document Revised: 11/07/2017 Document Reviewed: 06/20/2017 Elsevier Patient Education  2020 [employer]vier Inc[employer]  

## 2020-10-19 ENCOUNTER — Encounter: Payer: Self-pay | Admitting: Gastroenterology

## 2020-10-19 ENCOUNTER — Ambulatory Visit: Payer: Medicaid Other | Admitting: Anesthesiology

## 2020-10-19 ENCOUNTER — Other Ambulatory Visit: Payer: Self-pay

## 2020-10-19 ENCOUNTER — Ambulatory Visit
Admission: RE | Admit: 2020-10-19 | Discharge: 2020-10-19 | Disposition: A | Discharge status: Home / Self Care | Payer: Medicaid Other | Attending: Gastroenterology | Admitting: Gastroenterology

## 2020-10-19 ENCOUNTER — Encounter
Admission: RE | Disposition: A | Discharge status: Home / Self Care | Payer: Self-pay | Source: Home / Self Care | Attending: Gastroenterology

## 2020-10-19 DIAGNOSIS — Z87891 Personal history of nicotine dependence: Secondary | ICD-10-CM | POA: Diagnosis not present

## 2020-10-19 DIAGNOSIS — R1013 Epigastric pain: Secondary | ICD-10-CM | POA: Insufficient documentation

## 2020-10-19 DIAGNOSIS — R634 Abnormal weight loss: Secondary | ICD-10-CM | POA: Insufficient documentation

## 2020-10-19 DIAGNOSIS — Z6836 Body mass index (BMI) 36.0-36.9, adult: Secondary | ICD-10-CM | POA: Insufficient documentation

## 2020-10-19 DIAGNOSIS — Z79899 Other long term (current) drug therapy: Secondary | ICD-10-CM | POA: Diagnosis not present

## 2020-10-19 DIAGNOSIS — K449 Diaphragmatic hernia without obstruction or gangrene: Secondary | ICD-10-CM | POA: Diagnosis not present

## 2020-10-19 DIAGNOSIS — G8929 Other chronic pain: Secondary | ICD-10-CM | POA: Diagnosis not present

## 2020-10-19 DIAGNOSIS — R112 Nausea with vomiting, unspecified: Secondary | ICD-10-CM | POA: Insufficient documentation

## 2020-10-19 HISTORY — PX: ESOPHAGOGASTRODUODENOSCOPY (EGD) WITH PROPOFOL: SHX5813

## 2020-10-19 HISTORY — DX: Other specified health status: Z78.9

## 2020-10-19 LAB — POCT PREGNANCY, URINE: Preg Test, Ur: NEGATIVE

## 2020-10-19 SURGERY — ESOPHAGOGASTRODUODENOSCOPY (EGD) WITH PROPOFOL
Anesthesia: General | Site: Mouth

## 2020-10-19 MED ORDER — PROPOFOL 10 MG/ML IV BOLUS
INTRAVENOUS | Status: DC | PRN
  Administered 2020-10-19: 30 mg via INTRAVENOUS
  Administered 2020-10-19: 70 mg via INTRAVENOUS
  Administered 2020-10-19 (×5): 30 mg via INTRAVENOUS

## 2020-10-19 MED ORDER — LACTATED RINGERS IV SOLN: INTRAVENOUS | Status: DC

## 2020-10-19 MED ORDER — STERILE WATER FOR IRRIGATION IR SOLN: Status: DC | PRN

## 2020-10-19 MED ORDER — SODIUM CHLORIDE 0.9 % IV SOLN: INTRAVENOUS | Status: DC

## 2020-10-19 MED ORDER — LIDOCAINE HCL (CARDIAC) PF 100 MG/5ML IV SOSY
PREFILLED_SYRINGE | INTRAVENOUS | Status: DC | PRN
  Administered 2020-10-19: 60 mg via INTRAVENOUS

## 2020-10-19 SURGICAL SUPPLY — 8 items
BLOCK BITE 60FR ADLT L/F GRN (MISCELLANEOUS) ×3 IMPLANT
FORCEPS BIOP RAD 4 LRG CAP 4 (CUTTING FORCEPS) ×3 IMPLANT
GOWN CVR UNV OPN BCK APRN NK (MISCELLANEOUS) ×2 IMPLANT
GOWN ISOL THUMB LOOP REG UNIV (MISCELLANEOUS) ×6
KIT PRC NS LF DISP ENDO (KITS) ×1 IMPLANT
KIT PROCEDURE OLYMPUS (KITS) ×3
MANIFOLD NEPTUNE II (INSTRUMENTS) ×3 IMPLANT
WATER STERILE IRR 250ML POUR (IV SOLUTION) ×3 IMPLANT

## 2020-10-19 NOTE — Anesthesia Preprocedure Evaluation (Signed)
Anesthesia Evaluation  Patient identified by MRN, date of birth, ID band Patient awake    Reviewed: Allergy & Precautions, NPO status , Patient's Chart, lab work & pertinent test results  History of Anesthesia Complications Negative for: history of anesthetic complications  Airway Mallampati: II  TM Distance: >3 FB Neck ROM: Full    Dental no notable dental hx.    Pulmonary asthma (no inhalers for years) , Patient abstained from smoking., former smoker,    Pulmonary exam normal breath sounds clear to auscultation       Cardiovascular Exercise Tolerance: Good negative cardio ROS Normal cardiovascular exam Rhythm:Regular Rate:Normal     Neuro/Psych negative neurological ROS     GI/Hepatic Neg liver ROS, GERD  ,  Endo/Other  Hypothyroidism (off meds now) Obesity  Renal/GU negative Renal ROS  negative genitourinary   Musculoskeletal negative musculoskeletal ROS (+)   Abdominal (+) + obese,   Peds negative pediatric ROS (+)  Hematology negative hematology ROS (+)   Anesthesia Other Findings   Reproductive/Obstetrics negative OB ROS                             Anesthesia Physical  Anesthesia Plan  ASA: II  Anesthesia Plan: General   Post-op Pain Management:    Induction: Intravenous  PONV Risk Score and Plan: 2 and Treatment may vary due to age or medical condition and Propofol infusion  Airway Management Planned: Natural Airway and Nasal Cannula  Additional Equipment:   Intra-op Plan:   Post-operative Plan:   Informed Consent: I have reviewed the patients History and Physical, chart, labs and discussed the procedure including the risks, benefits and alternatives for the proposed anesthesia with the patient or authorized representative who has indicated his/her understanding and acceptance.     Dental advisory given  Plan Discussed with: CRNA  Anesthesia Plan Comments:          Anesthesia Quick Evaluation  Patient Active Problem List   Diagnosis Date Noted  . Borderline personality disorder (HCC) 02/18/2018  . Moderate episode of recurrent major depressive disorder (HCC) 02/18/2018  . Bipolar 2 disorder, major depressive episode (HCC) 01/14/2017  . Ectopic pregnancy 11/24/2016    CBC Latest Ref Rng & Units 07/20/2020 11/30/2016 11/24/2016  WBC 4.0 - 10.5 K/uL 9.7 7.7 10.0  Hemoglobin 12.0 - 15.0 g/dL 72.0 94.7 11.1(L)  Hematocrit 36 - 46 % 38.7 35.9 33.3(L)  Platelets 150 - 400 K/uL 244 267 212   BMP Latest Ref Rng & Units 07/20/2020 11/30/2016 11/23/2016  Glucose 70 - 99 mg/dL 93 95 88  BUN 6 - 20 mg/dL 5(L) 7 10  Creatinine 0.96 - 1.00 mg/dL 2.83 6.62 9.47  Sodium 135 - 145 mmol/L 139 139 137  Potassium 3.5 - 5.1 mmol/L 3.5 4.2 3.6  Chloride 98 - 111 mmol/L 107 107 106  CO2 22 - 32 mmol/L 23 25 25   Calcium 8.9 - 10.3 mg/dL 9.0 9.4 8.9    Risks and benefits of anesthesia discussed at length, patient or surrogate demonstrates understanding. Appropriately NPO. Plan to proceed with anesthesia.  , MD 10/19/20

## 2020-10-19 NOTE — Interval H&P Note (Signed)
Midge Minium, MD [facility] [address] [place], [place] [address] Phone:[phone] Fax : [phone]  Primary Care Physician:  Marin Comment, FNP Primary Gastroenterologist:  Dr. Servando Snare  Pre-Procedure History & Physical: HPI:  Claire Frederick is a 29 y.o. female is here for an endoscopy.   Past Medical History:  Diagnosis Date   Anxiety    Bipolar disorder (HCC)    Depression    Ectopic pregnancy 11/24/2016   Hypothyroidism    Multiple body piercings    including tongue    Past Surgical History:  Procedure Laterality Date   LAPAROSCOPIC UNILATERAL SALPINGECTOMY Right 11/30/2016   Procedure: LAPAROSCOPIC UNILATERAL SALPINGECTOMY WITH ECTOPIC;  Surgeon: Conard Novak, MD;  Location: [facility] ORS;  Service: Gynecology;  Laterality: Right;    Prior to Admission medications   Medication Sig Start Date End Date Taking? Authorizing Provider  acetaminophen (TYLENOL) 325 MG tablet Take 1 tablet (325 mg total) by mouth every 6 (six) hours as needed for moderate pain. 01/19/17  Yes Armandina Stammer I, NP  albuterol (VENTOLIN HFA) 108 (90 Base) MCG/ACT inhaler Inhale into the lungs every 6 (six) hours as needed for wheezing or shortness of breath.   Yes [provider]  etonogestrel (NEXPLANON) 68 MG IMPL implant 1 each (68 mg total) by Subdermal route once for 1 dose. 02/16/20 10/17/20 Yes Conard Novak, MD  FLUoxetine (PROZAC) 20 MG capsule Take by mouth. 06/24/19  Yes [provider]  hydrOXYzine (ATARAX/VISTARIL) 25 MG tablet Take by mouth.   Yes [provider]  loratadine (CLARITIN) 10 MG tablet Take 1 tablet (10 mg total) by mouth daily. For allergies 01/19/17  Yes Nwoko, Nicole Kindred I, NP  ondansetron (ZOFRAN) 4 MG tablet Take by mouth.   Yes [provider]  pantoprazole (PROTONIX) 40 MG tablet Take 1 tablet (40 mg total) by mouth daily. 07/20/20 07/20/21 Yes Jene Every, MD  sucralfate (CARAFATE) 1 g tablet Take 1 tablet (1 g total) by mouth 4  (four) times daily. 07/20/20 10/17/20 Yes Jene Every, MD    Allergies as of 10/16/2020   (No Known Allergies)    Family History  Problem Relation Age of Onset   Drug abuse Mother    Diabetes Father    Hypercholesterolemia Father    Diabetes Paternal Uncle     Social History   Socioeconomic History   Marital status: Legally Separated    Spouse name: Not on file   Number of children: Not on file   Years of education: Not on file   Highest education level: Not on file  Occupational History   Not on file  Tobacco Use   Smoking status: Former Smoker    Packs/day: 0.50    Types: Cigarettes    Quit date: 11/18/2019    Years since quitting: 0.9   Smokeless tobacco: Never Used  Vaping Use   Vaping Use: Every day  Substance and Sexual Activity   Alcohol use: Yes    Comment: socially   Drug use: Yes    Types: Marijuana   Sexual activity: Yes    Partners: Male    Birth control/protection: Implant  Other Topics Concern   Not on file  Social History Narrative   Not on file   Social Determinants of Health   Financial Resource Strain:    Difficulty of Paying Living Expenses: Not on file  Food Insecurity:    Worried About Running Out of Food in the Last Year: Not on file  Ran Out of Food in the Last Year: Not on file  Transportation Needs:    Lack of Transportation (Medical): Not on file   Lack of Transportation (Non-Medical): Not on file  Physical Activity:    Days of Exercise per Week: Not on file   Minutes of Exercise per Session: Not on file  Stress:    Feeling of Stress : Not on file  Social Connections:    Frequency of Communication with Friends and Family: Not on file   Frequency of Social Gatherings with Friends and Family: Not on file   Attends Religious Services: Not on file   Active Member of Clubs or Organizations: Not on file   Attends Banker Meetings: Not on file   Marital Status: Not on file  Intimate Partner Violence:    Fear of  Current or Ex-Partner: Not on file   Emotionally Abused: Not on file   Physically Abused: Not on file   Sexually Abused: Not on file    Review of Systems: See HPI, otherwise negative ROS  Physical Exam: BP (!) 131/92   Pulse 78   Temp (!) 97.2 F (36.2 C) (Temporal)   Ht 5\' 4"  (1.626 m)   Wt 95.7 kg   SpO2 97%   BMI 36.22 kg/m  General:   Alert,  pleasant and cooperative in NAD Head:  Normocephalic and atraumatic. Neck:  Supple; no masses or thyromegaly. Lungs:  Clear throughout to auscultation.    Heart:  Regular rate and rhythm. Abdomen:  Soft, nontender and nondistended. Normal bowel sounds, without guarding, and without rebound.   Neurologic:  Alert and  oriented x4;  grossly normal neurologically.  Impression/Plan: Claire Frederick is here for an endoscopy to be performed for epigastric pain and weight loss  Risks, benefits, limitations, and alternatives regarding  endoscopy have been reviewed with the patient.  Questions have been answered.  All parties agreeable.   Lily Kocher, MD  10/19/2020, 8:31 AM

## 2020-10-19 NOTE — Transfer of Care (Addendum)
Immediate Anesthesia Transfer of Care Note  Patient: Claire Frederick  Procedure(s) Performed: ESOPHAGOGASTRODUODENOSCOPY (EGD) WITH BIOPSY (N/A Mouth)  Patient Location: PACU  Anesthesia Type: General  Level of Consciousness: awake, alert  and patient cooperative  Airway and Oxygen Therapy: Patient Spontanous Breathing and Patient connected to supplemental oxygen  Post-op Assessment: Post-op Vital signs reviewed, Patient's Cardiovascular Status Stable, Respiratory Function Stable, Patent Airway and No signs of Nausea or vomiting  Post-op Vital Signs: Reviewed and stable  Complications: No complications documented.

## 2020-10-19 NOTE — Op Note (Signed)
[facility] Gastroenterology Patient Name: Claire Frederick Procedure Date: 10/19/2020 9:09 AM MRN: [id number] Account #: [id number] Date of Birth: [birth year] Admit Type: Outpatient Age: 29 Room: [facility] OR ROOM 01 Gender: Female Note Status: Finalized Procedure:             Upper GI endoscopy Indications:           Nausea with vomiting, Weight loss Providers:             Midge Minium MD, MD Referring MD:          Jeb Levering. Wells (Referring MD) Medicines:             Propofol per Anesthesia Complications:         No immediate complications. Procedure:             Pre-Anesthesia Assessment:                        - Prior to the procedure, a History and Physical was                         performed, and patient medications and allergies were                         reviewed. The patient's tolerance of previous                         anesthesia was also reviewed. The risks and benefits                         of the procedure and the sedation options and risks                         were discussed with the patient. All questions were                         answered, and informed consent was obtained. Prior                         Anticoagulants: The patient has taken no previous                         anticoagulant or antiplatelet agents. ASA Grade                         Assessment: II - A patient with mild systemic disease.                         After reviewing the risks and benefits, the patient                         was deemed in satisfactory condition to undergo the                         procedure.                        After obtaining informed consent, the endoscope was  passed under direct vision. Throughout the procedure,                         the patient's blood pressure, pulse, and oxygen                         saturations were monitored continuously. The was                         introduced through the mouth, and advanced to  the                         second part of duodenum. The upper GI endoscopy was                         accomplished without difficulty. The patient tolerated                         the procedure well. Findings:      A small hiatal hernia was present.      The stomach was normal.      The examined duodenum was normal. Biopsies were taken with a cold       forceps for histology. Impression:            - Small hiatal hernia.                        - Normal stomach.                        - Normal examined duodenum. Biopsied. Recommendation:        - Discharge patient to home.                        - Resume previous diet.                        - Continue present medications.                        - Await pathology results. Procedure Code(s):     --- Professional ---                        [phone], Esophagogastroduodenoscopy, flexible,                         transoral; with biopsy, single or multiple Diagnosis Code(s):     --- Professional ---                        R11.2, Nausea with vomiting, unspecified                        R63.4, Abnormal weight loss CPT copyright 2019 American Medical Association. All rights reserved. The codes documented in this report are preliminary and upon coder review may  be revised to meet current compliance requirements. Midge Minium MD, MD 10/19/2020 9:27:04 AM This report has been signed electronically. Number of Addenda: 0 Note Initiated On: 10/19/2020 9:09 AM Total Procedure Duration: 0 hours 4 minutes 0 seconds  Estimated Blood Loss:  Estimated blood loss:  none.      [facility]

## 2020-10-19 NOTE — Anesthesia Postprocedure Evaluation (Signed)
Anesthesia Post Note  Patient: Claire Frederick  Procedure(s) Performed: ESOPHAGOGASTRODUODENOSCOPY (EGD) WITH BIOPSY (N/A Mouth)     Patient location during evaluation: PACU Anesthesia Type: General Level of consciousness: awake and alert Pain management: pain level controlled Vital Signs Assessment: post-procedure vital signs reviewed and stable Respiratory status: spontaneous breathing, nonlabored ventilation, respiratory function stable and patient connected to nasal cannula oxygen Cardiovascular status: blood pressure returned to baseline and stable Postop Assessment: no apparent nausea or vomiting Anesthetic complications: no   There were no known complications for this encounter.  Edwyna Ready

## 2020-10-20 ENCOUNTER — Encounter: Payer: Self-pay | Admitting: Gastroenterology

## 2020-10-23 LAB — SURGICAL PATHOLOGY

## 2020-10-27 ENCOUNTER — Telehealth: Payer: Self-pay

## 2020-10-27 NOTE — Telephone Encounter (Signed)
Pt notified of results and will stop by the office to sign SIBO test form.

## 2020-10-27 NOTE — Telephone Encounter (Signed)
-----   Message from Midge Minium, MD sent at 10/24/2020  7:46 AM EST ----- Let patient know that the small bowel biopsies showed increased white cells and bacteria which should be followed up with a small intestinal bacterial overgrowth test.

## 2020-11-02 ENCOUNTER — Other Ambulatory Visit: Payer: Medicaid Other

## 2020-11-02 ENCOUNTER — Other Ambulatory Visit: Payer: Self-pay

## 2020-11-02 ENCOUNTER — Encounter
Admission: RE | Admit: 2020-11-02 | Discharge: 2020-11-02 | Disposition: A | Discharge status: Home / Self Care | Payer: Medicaid Other | Source: Ambulatory Visit | Attending: Gastroenterology | Admitting: Gastroenterology

## 2020-11-02 ENCOUNTER — Ambulatory Visit
Admission: RE | Admit: 2020-11-02 | Discharge: 2020-11-02 | Disposition: A | Discharge status: Home / Self Care | Payer: Medicaid Other | Source: Ambulatory Visit | Attending: Gastroenterology | Admitting: Gastroenterology

## 2020-11-02 DIAGNOSIS — R112 Nausea with vomiting, unspecified: Secondary | ICD-10-CM | POA: Insufficient documentation

## 2020-11-02 MED ORDER — TECHNETIUM TC 99M MEBROFENIN IV KIT
5.0000 | PACK | Freq: Once | INTRAVENOUS | Status: AC | PRN
  Administered 2020-11-02: 10:00:00 5.28 via INTRAVENOUS

## 2020-11-03 ENCOUNTER — Telehealth: Payer: Self-pay

## 2020-11-03 NOTE — Telephone Encounter (Signed)
Pt notified of US results via mychart.  

## 2020-11-03 NOTE — Telephone Encounter (Signed)
-----   Message from Midge Minium, MD sent at 11/02/2020  4:33 PM EST ----- Let the patient know that the ultrasound of the right upper quadrant was unremarkable.

## 2020-11-06 ENCOUNTER — Telehealth: Payer: Self-pay

## 2020-11-06 NOTE — Telephone Encounter (Signed)
-----   Message from Midge Minium, MD sent at 11/05/2020  9:09 AM EST ----- With the patient know that the gallbladder is working well on her gallbladder ejection study.

## 2020-11-06 NOTE — Telephone Encounter (Signed)
Pt notified of HIDA scan results via mychart. 

## 2021-09-11 NOTE — Telephone Encounter (Signed)
Nexplanon rcvd/charged 02/16/2020

## 2021-09-27 ENCOUNTER — Encounter: Payer: Self-pay | Admitting: Obstetrics and Gynecology

## 2021-09-27 ENCOUNTER — Ambulatory Visit (INDEPENDENT_AMBULATORY_CARE_PROVIDER_SITE_OTHER): Payer: Medicaid Other | Admitting: Obstetrics and Gynecology

## 2021-09-27 VITALS — BP 110/78 | Wt 221.0 lb

## 2021-09-27 DIAGNOSIS — R102 Pelvic and perineal pain: Secondary | ICD-10-CM

## 2021-09-27 DIAGNOSIS — Z113 Encounter for screening for infections with a predominantly sexual mode of transmission: Secondary | ICD-10-CM

## 2021-09-27 NOTE — Progress Notes (Signed)
Obstetrics & Gynecology Office Visit   Chief Complaint  Patient presents with   Abdominal Pain    History of Present Illness: 30 y.o. [phone] female who presents with pelvic pain with pain where her uterus and ovaries are located. She states that when she lived in [place] a few years ago she was told that she might have endometriosis. She has never had this followed up on.  The pain started about 3 months ago. It was initially dull. The last month has been more intense. She describes the pain as pressure.  She feels more pain and pressure when sitting down and when she goes to void. About 2 months ago she had pain with intercourse.  This was the last time she had intercourse.  She missed two months of periods and she thought she was pregnant. However, she started her period a few days ago.  She would also like STD screening due to a recent end of a relationship.  The pain is in her entire lower abdomen and pelvic area.  The pain does not radiate.  The pain is in the entire area. The pain comes and goes.  Aggravating factors: sitting down, wearing a belt. Sometimes it is unprovoked.  Alleviating factors: nothing.  Associated symptoms: feels tired.  She denies fevers and chills. She says he has hot flashes.  She notes as little nausea in the morning.  She has no emesis.  She notes no new constipation and diarrhea.  She notes urinary frequency. She notes stress urinary incontinence. So, she has to wear a pad 24/7.  She denies dysuria.  She notes UTIs in the past where she didn't feel different (no symptoms).  She notes hematochezia.  She also notes a tingle/numbness in her pelvis.    She had a 3-day straight run of pain that did not stop that almost felt like contractions.    Past Medical History:  Diagnosis Date   Anxiety    Bipolar disorder (HCC)    Depression    Ectopic pregnancy 11/24/2016   Hernia, hiatal    Hypothyroidism    Multiple body piercings    including tongue    Past Surgical  History:  Procedure Laterality Date   ESOPHAGOGASTRODUODENOSCOPY (EGD) WITH PROPOFOL N/A 10/19/2020   Procedure: ESOPHAGOGASTRODUODENOSCOPY (EGD) WITH BIOPSY;  Surgeon: Midge Minium, MD;  Location: [facility];  Service: Endoscopy;  Laterality: N/A;  UPREG   LAPAROSCOPIC UNILATERAL SALPINGECTOMY Right 11/30/2016   Procedure: LAPAROSCOPIC UNILATERAL SALPINGECTOMY WITH ECTOPIC;  Surgeon: Conard Novak, MD;  Location: [facility];  Service: Gynecology;  Laterality: Right;    Gynecologic History: Patient's last menstrual period was 09/25/2021.  Obstetric History: [id number]  Family History  Problem Relation Age of Onset   Drug abuse Mother    Diabetes Father    Hypercholesterolemia Father    Diabetes Paternal Uncle     Social History   Socioeconomic History   Marital status: Legally Separated    Spouse name: Not on file   Number of children: Not on file   Years of education: Not on file   Highest education level: Not on file  Occupational History   Not on file  Tobacco Use   Smoking status: Former    Packs/day: 0.50    Types: Cigarettes    Quit date: 11/18/2019    Years since quitting: 1.8   Smokeless tobacco: Never  Vaping Use   Vaping Use: Every day  Substance and Sexual Activity   Alcohol use: Yes  Comment: socially   Drug use: Yes    Types: Marijuana   Sexual activity: Yes    Partners: Male    Birth control/protection: Implant  Other Topics Concern   Not on file  Social History Narrative   Not on file   Social Determinants of Health   Financial Resource Strain: Not on file  Food Insecurity: Not on file  Transportation Needs: Not on file  Physical Activity: Not on file  Stress: Not on file  Social Connections: Not on file  Intimate Partner Violence: Not on file    No Known Allergies  Prior to Admission medications   Medication Sig Start Date End Date Taking? Authorizing Provider  acetaminophen (TYLENOL) 325 MG tablet Take 1 tablet (325 mg total)  by mouth every 6 (six) hours as needed for moderate pain. 01/19/17  Yes Lindell Spar I, NP  albuterol (VENTOLIN HFA) 108 (90 Base) MCG/ACT inhaler Inhale into the lungs every 6 (six) hours as needed for wheezing or shortness of breath.   Yes [provider]  hydrOXYzine (ATARAX/VISTARIL) 25 MG tablet Take by mouth.   Yes [provider]  loratadine (CLARITIN) 10 MG tablet Take 1 tablet (10 mg total) by mouth daily. For allergies 01/19/17  Yes Nwoko, Herbert Pun I, NP  etonogestrel (NEXPLANON) 68 MG IMPL implant 1 each (68 mg total) by Subdermal route once for 1 dose. 02/16/20 10/17/20  Will Bonnet, MD  pantoprazole (PROTONIX) 40 MG tablet Take 1 tablet (40 mg total) by mouth daily. 07/20/20 07/20/21  Lavonia Drafts, MD  sucralfate (CARAFATE) 1 g tablet Take 1 tablet (1 g total) by mouth 4 (four) times daily. 07/20/20 10/17/20  Lavonia Drafts, MD    Review of Systems  Constitutional:  Positive for malaise/fatigue. Negative for chills, diaphoresis, fever and weight loss.  HENT: Negative.    Eyes: Negative.   Respiratory: Negative.    Cardiovascular: Negative.   Gastrointestinal:  Positive for abdominal pain, blood in stool and nausea. Negative for constipation, diarrhea, heartburn, melena and vomiting.  Genitourinary:  Positive for frequency. Negative for dysuria, flank pain, hematuria and urgency.  Musculoskeletal: Negative.   Skin: Negative.   Neurological: Negative.   Psychiatric/Behavioral: Negative.      Physical Exam BP 110/78   Wt 221 lb (100.2 kg)   LMP 09/25/2021   BMI 37.93 kg/m  Patient's last menstrual period was 09/25/2021. Physical Exam Constitutional:      General: She is not in acute distress.    Appearance: Normal appearance. She is well-developed.  Genitourinary:     Vulva, bladder and urethral meatus normal.     Right Labia: No rash, tenderness, lesions, skin changes or Bartholin's cyst.    Left Labia: No tenderness, skin changes, Bartholin's cyst or  rash.    No inguinal adenopathy present in the right or left side.    Pelvic Tanner Score: 5/5.     Right Adnexa: not tender, not full and no mass present.    Left Adnexa: not tender, not full and no mass present.    No cervical motion tenderness, friability, lesion or polyp.     Uterus is not enlarged, fixed or tender.     Uterus is anteverted.     No urethral tenderness or mass present.     Pelvic exam was performed with patient in the lithotomy position.  HENT:     Head: Normocephalic and atraumatic.  Eyes:     General: No scleral icterus.    Conjunctiva/sclera: Conjunctivae normal.  Cardiovascular:     Rate and Rhythm: Normal rate and regular rhythm.     Heart sounds: No murmur heard.   No friction rub. No gallop.  Pulmonary:     Effort: Pulmonary effort is normal. No respiratory distress.     Breath sounds: Normal breath sounds. No wheezing or rales.  Abdominal:     General: Bowel sounds are normal. There is no distension.     Palpations: Abdomen is soft. There is no mass.     Tenderness: There is no abdominal tenderness. There is no guarding or rebound.     Hernia: There is no hernia in the left inguinal area or right inguinal area.  Musculoskeletal:        General: Normal range of motion.     Cervical back: Normal range of motion and neck supple.  Lymphadenopathy:     Lower Body: No right inguinal adenopathy. No left inguinal adenopathy.  Neurological:     General: No focal deficit present.     Mental Status: She is alert and oriented to person, place, and time.     Cranial Nerves: No cranial nerve deficit.  Skin:    General: Skin is warm and dry.     Findings: No erythema.  Psychiatric:        Mood and Affect: Mood normal.        Behavior: Behavior normal.        Judgment: Judgment normal.    Female chaperone present for pelvic and breast  portions of the physical exam  Assessment: 30 y.o. [id number] female here for  1. Pelvic pain   2. Screen for STD (sexually  transmitted disease)      Plan: Problem List Items Addressed This Visit   None Visit Diagnoses     Pelvic pain    -  Primary   Relevant Orders   NuSwab Vaginitis Plus (VG+)   US PELVIS TRANSVAGINAL NON-OB (TV ONLY)   Urine Culture   Screen for STD (sexually transmitted disease)       Relevant Orders   NuSwab Vaginitis Plus (VG+)      Her source of the pain is unclear.  Her source could be gastrointestinal, urologic, reproductive, or musculoskeletal.  In cases such as these there is also the possibility of a psychologic component.  However, physical etiologies should be excluded first.  I will obtain a pelvic ultrasound and have collected testing for STDs and vaginitis.  I have also sent a urine culture.  We will follow-up on these as soon as we can get them.  A total of 23 minutes were spent face-to-face with the patient as well as preparation, review, communication, and documentation during this encounter.    Prentice Docker, MD 09/27/2021 4:10 PM

## 2021-09-28 ENCOUNTER — Other Ambulatory Visit: Payer: Self-pay

## 2021-09-28 ENCOUNTER — Ambulatory Visit (INDEPENDENT_AMBULATORY_CARE_PROVIDER_SITE_OTHER): Payer: Medicaid Other

## 2021-09-28 DIAGNOSIS — R102 Pelvic and perineal pain: Secondary | ICD-10-CM

## 2021-09-29 ENCOUNTER — Encounter: Payer: Self-pay | Admitting: Obstetrics and Gynecology

## 2021-10-01 LAB — NUSWAB VAGINITIS PLUS (VG+)
Candida albicans, NAA: NEGATIVE
Candida glabrata, NAA: NEGATIVE
Chlamydia trachomatis, NAA: NEGATIVE
Neisseria gonorrhoeae, NAA: NEGATIVE
Trich vag by NAA: NEGATIVE

## 2021-10-02 ENCOUNTER — Ambulatory Visit (INDEPENDENT_AMBULATORY_CARE_PROVIDER_SITE_OTHER): Payer: Medicaid Other | Admitting: Gastroenterology

## 2021-10-02 ENCOUNTER — Encounter: Payer: Self-pay | Admitting: Gastroenterology

## 2021-10-02 VITALS — BP 110/76 | HR 112 | Temp 98.7°F | Wt 225.0 lb

## 2021-10-02 DIAGNOSIS — K625 Hemorrhage of anus and rectum: Secondary | ICD-10-CM | POA: Diagnosis not present

## 2021-10-02 MED ORDER — SUCRALFATE 1 G PO TABS: 1.0000 g | ORAL_TABLET | Freq: Four times a day (QID) | ORAL | 11 refills | Status: DC

## 2021-10-02 MED ORDER — PANTOPRAZOLE SODIUM 40 MG PO TBEC: 40.0000 mg | DELAYED_RELEASE_TABLET | Freq: Every day | ORAL | 11 refills | Status: DC
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
## 2021-10-03 LAB — URINE CULTURE

## 2021-10-10 ENCOUNTER — Ambulatory Visit (INDEPENDENT_AMBULATORY_CARE_PROVIDER_SITE_OTHER): Payer: Medicaid Other | Admitting: Obstetrics and Gynecology

## 2021-10-10 ENCOUNTER — Other Ambulatory Visit: Payer: Self-pay

## 2021-10-10 ENCOUNTER — Encounter: Payer: Self-pay | Admitting: Obstetrics and Gynecology

## 2021-10-10 VITALS — BP 113/80 | Ht 64.0 in | Wt 228.0 lb

## 2021-10-10 DIAGNOSIS — R102 Pelvic and perineal pain: Secondary | ICD-10-CM

## 2021-10-10 DIAGNOSIS — G8929 Other chronic pain: Secondary | ICD-10-CM

## 2021-10-10 MED ORDER — NORETHINDRONE ACETATE 5 MG PO TABS: 5.0000 mg | ORAL_TABLET | Freq: Every day | ORAL | 4 refills | Status: DC

## 2021-10-10 NOTE — Progress Notes (Signed)
Gynecology Ultrasound Follow Up   Chief Complaint  Patient presents with   Follow-up  Ultrasound for Pelvic pain   History of Present Illness: Patient is a 30 y.o. female who presents today for ultrasound evaluation of the above .  Ultrasound demonstrates the following findings Adnexa: right ovary with simple-appearing cyst 3.6 x 2.7 x 3.0 cm Uterus: anteverted with endometrial stripe  3.6 mm Additional: small amount of fluid within the endometrial canal.   Normal STI and vaginitis testing. Lat pap smear 1.5 years, negative  She continues to have symptoms.   Past Medical History:  Diagnosis Date   Anxiety    Bipolar disorder (HCC)    Depression    Ectopic pregnancy 11/24/2016   Hernia, hiatal    Hypothyroidism    Multiple body piercings    including tongue    Past Surgical History:  Procedure Laterality Date   ESOPHAGOGASTRODUODENOSCOPY (EGD) WITH PROPOFOL N/A 10/19/2020   Procedure: ESOPHAGOGASTRODUODENOSCOPY (EGD) WITH BIOPSY;  Surgeon: Midge Minium, MD;  Location: [facility];  Service: Endoscopy;  Laterality: N/A;  UPREG   LAPAROSCOPIC UNILATERAL SALPINGECTOMY Right 11/30/2016   Procedure: LAPAROSCOPIC UNILATERAL SALPINGECTOMY WITH ECTOPIC;  Surgeon: Conard Novak, MD;  Location: [facility];  Service: Gynecology;  Laterality: Right;    Family History  Problem Relation Age of Onset   Drug abuse Mother    Diabetes Father    Hypercholesterolemia Father    Diabetes Paternal Uncle     Social History   Socioeconomic History   Marital status: Legally Separated    Spouse name: Not on file   Number of children: Not on file   Years of education: Not on file   Highest education level: Not on file  Occupational History   Not on file  Tobacco Use   Smoking status: Former    Packs/day: 0.50    Types: Cigarettes    Quit date: 11/18/2019    Years since quitting: 1.8   Smokeless tobacco: Never  Vaping Use   Vaping Use: Every day  Substance and Sexual  Activity   Alcohol use: Yes    Comment: socially   Drug use: Yes    Types: Marijuana   Sexual activity: Yes    Partners: Male    Birth control/protection: Implant  Other Topics Concern   Not on file  Social History Narrative   Not on file   Social Determinants of Health   Financial Resource Strain: Not on file  Food Insecurity: Not on file  Transportation Needs: Not on file  Physical Activity: Not on file  Stress: Not on file  Social Connections: Not on file  Intimate Partner Violence: Not on file    No Known Allergies  Prior to Admission medications   Medication Sig Start Date End Date Taking? Authorizing Provider  acetaminophen (TYLENOL) 325 MG tablet Take 1 tablet (325 mg total) by mouth every 6 (six) hours as needed for moderate pain. 01/19/17   Armandina Stammer I, NP  albuterol (VENTOLIN HFA) 108 (90 Base) MCG/ACT inhaler Inhale into the lungs every 6 (six) hours as needed for wheezing or shortness of breath.    [provider]  etonogestrel (NEXPLANON) 68 MG IMPL implant 1 each (68 mg total) by Subdermal route once for 1 dose. 02/16/20 10/17/20  Conard Novak, MD  FLUoxetine (PROZAC) 20 MG capsule Take by mouth. 06/24/19   [provider]  hydrOXYzine (ATARAX/VISTARIL) 25 MG tablet Take by mouth.    [provider]  loratadine (CLARITIN) 10 MG tablet Take 1 tablet (10 mg total) by mouth daily. For allergies 01/19/17   Armandina Stammer I, NP  ondansetron (ZOFRAN) 4 MG tablet Take by mouth.    [provider]  pantoprazole (PROTONIX) 40 MG tablet Take 1 tablet (40 mg total) by mouth daily. 10/02/21 10/02/22  Midge Minium, MD  sucralfate (CARAFATE) 1 g tablet Take 1 tablet (1 g total) by mouth 4 (four) times daily. 10/02/21 11/01/21  Midge Minium, MD    Physical Exam BP 113/80   Ht 5\' 4"  (1.626 m)   Wt 228 lb (103.4 kg)   LMP 09/25/2021 (Exact Date)   BMI 39.14 kg/m    General: NAD HEENT: normocephalic, anicteric Pulmonary: No increased  work of breathing Extremities: no edema, erythema, or tenderness Neurologic: Grossly intact, normal gait Psychiatric: mood appropriate, affect full  Imaging Results 13/06/2021 PELVIS TRANSVAGINAL NON-OB (TV ONLY)  Result Date: 10/01/2021 Patient Name: Claire Frederick DOB: 02-16-1991 MRN: 1991 ULTRASOUND REPORT Location: [facility] Date of Service: 09/28/2021 Indications:Pelvic Pain Findings: The uterus is anteverted and measures 7.3 x  3.2 x 4.0 cm. Echo texture is heterogenous without evidence of focal masses. The Endometrium measures 3.6 mm and is fluid filled. Right Ovary measures 4.3 x 3.2 x 3.7 cm and contains a simple appearing cyst measuring 3.6 x 2.7 x 3.0 cm Left Ovary measures 2.7 x 2.4 x 2.4 cm and contains a 5mm septated cyst/dominant follicle. Survey of the adnexa demonstrates no adnexal masses. There is no free fluid in the cul de sac. Impression: 1. Right ovarian simple cyst measuring 3.6 x 2.7 x 3.0 cm. 2. Otherwise no abnormality seen. 8m  Henderson-Gainey The ultrasound images and findings were reviewed by me and I agree with the above report. Sheralyn Boatman, MD, Thomas[facility] Mohair [facility], [facility] 10/01/2021 12:24 AM       Assessment: 30 y.o. [id number]  1. Chronic pelvic pain in female      Plan: Problem List Items Addressed This Visit   None Visit Diagnoses     Chronic pelvic pain in female    -  Primary   Relevant Medications   norethindrone (AYGESTIN) 5 MG tablet      Will try to treat presumptively for endometriosis with norethindrone 5 mg dailiy   A total of 22 minutes were spent face-to-face with the patient as well as preparation, review, communication, and documentation during this encounter.    10/03/2021, MD, Thomasene Mohair OB/GYN, [facility] 10/10/2021 3:41 PM

## 2021-10-25 ENCOUNTER — Other Ambulatory Visit: Payer: Self-pay

## 2021-10-26 ENCOUNTER — Telehealth: Payer: Self-pay

## 2021-10-26 NOTE — Telephone Encounter (Signed)
Left detailed msg.

## 2021-10-26 NOTE — Telephone Encounter (Signed)
Okay to discontinue if desired

## 2021-10-26 NOTE — Telephone Encounter (Signed)
Pt calling; has really bad pain in stomach; stomach is severely swollen; just started a new medication; should she stop it?  What to do? Has been told she has endometriosis.  [phone]  Pt states the pain is all over her abdomen; states she looks like she is 24m preg granted she Is overweight but not that big; she states she has a hernia; the medication SDJ put her on (Aygestin 5mg ); a side effect is bloating.

## 2021-10-29 ENCOUNTER — Encounter: Payer: Self-pay | Admitting: Gastroenterology

## 2021-10-30 ENCOUNTER — Encounter
Admission: RE | Disposition: A | Discharge status: Home / Self Care | Payer: Self-pay | Source: Home / Self Care | Attending: Gastroenterology

## 2021-10-30 ENCOUNTER — Encounter: Payer: Self-pay | Admitting: Gastroenterology

## 2021-10-30 ENCOUNTER — Encounter: Payer: Self-pay | Admitting: Anesthesiology

## 2021-10-30 ENCOUNTER — Ambulatory Visit
Admission: RE | Admit: 2021-10-30 | Discharge: 2021-10-30 | Disposition: A | Discharge status: Home / Self Care | Payer: Medicaid Other | Attending: Gastroenterology | Admitting: Gastroenterology

## 2021-10-30 DIAGNOSIS — K625 Hemorrhage of anus and rectum: Secondary | ICD-10-CM | POA: Diagnosis not present

## 2021-10-30 DIAGNOSIS — K644 Residual hemorrhoidal skin tags: Secondary | ICD-10-CM | POA: Diagnosis not present

## 2021-10-30 HISTORY — PX: FLEXIBLE SIGMOIDOSCOPY: SHX5431

## 2021-10-30 SURGERY — SIGMOIDOSCOPY, FLEXIBLE

## 2021-10-30 MED ORDER — SODIUM CHLORIDE 0.9 % IV SOLN: INTRAVENOUS | Status: DC

## 2021-10-30 NOTE — Anesthesia Preprocedure Evaluation (Deleted)
Anesthesia Evaluation  Patient identified by MRN, date of birth, ID band Patient awake    Reviewed: Allergy & Precautions, NPO status , Patient's Chart, lab work & pertinent test results  History of Anesthesia Complications Negative for: history of anesthetic complications  Airway Mallampati: II  TM Distance: >3 FB Neck ROM: Full    Dental no notable dental hx.    Pulmonary asthma (no inhalers for years) , Patient abstained from smoking., former smoker,    Pulmonary exam normal breath sounds clear to auscultation       Cardiovascular Exercise Tolerance: Good negative cardio ROS Normal cardiovascular exam Rhythm:Regular Rate:Normal     Neuro/Psych Depression Bipolar Disorder negative neurological ROS     GI/Hepatic Neg liver ROS, hiatal hernia (Small seen 12/21), GERD  ,Rectal bleeding   Endo/Other  Hypothyroidism (off meds now) Obesity  Renal/GU negative Renal ROS  negative genitourinary   Musculoskeletal negative musculoskeletal ROS (+)   Abdominal (+) + obese,   Peds negative pediatric ROS (+)  Hematology negative hematology ROS (+)   Anesthesia Other Findings   Reproductive/Obstetrics negative OB ROS                             Anesthesia Physical  Anesthesia Plan  ASA: II  Anesthesia Plan: General   Post-op Pain Management:    Induction: Intravenous  PONV Risk Score and Plan: 2 and Treatment may vary due to age or medical condition and Propofol infusion  Airway Management Planned: Natural Airway and Nasal Cannula  Additional Equipment:   Intra-op Plan:   Post-operative Plan:   Informed Consent:     Dental advisory given  Plan Discussed with: CRNA  Anesthesia Plan Comments:         Anesthesia Quick Evaluation  Patient Active Problem List   Diagnosis Date Noted  . Nausea and vomiting   . Abdominal pain, chronic, epigastric   . Borderline  personality disorder (HCC) 02/18/2018  . Moderate episode of recurrent major depressive disorder (HCC) 02/18/2018  . Bipolar 2 disorder, major depressive episode (HCC) 01/14/2017  . Ectopic pregnancy 11/24/2016    CBC Latest Ref Rng & Units 07/20/2020 11/30/2016 11/24/2016  WBC 4.0 - 10.5 K/uL 9.7 7.7 10.0  Hemoglobin 12.0 - 15.0 g/dL 37.8 58.8 11.1(L)  Hematocrit 36.0 - 46.0 % 38.7 35.9 33.3(L)  Platelets 150 - 400 K/uL 244 267 212   BMP Latest Ref Rng & Units 07/20/2020 11/30/2016 11/23/2016  Glucose 70 - 99 mg/dL 93 95 88  BUN 6 - 20 mg/dL 5(L) 7 10  Creatinine 5.02 - 1.00 mg/dL 7.74 1.28 7.86  Sodium 135 - 145 mmol/L 139 139 137  Potassium 3.5 - 5.1 mmol/L 3.5 4.2 3.6  Chloride 98 - 111 mmol/L 107 107 106  CO2 22 - 32 mmol/L 23 25 25   Calcium 8.9 - 10.3 mg/dL 9.0 9.4 8.9    Risks and benefits of anesthesia discussed at length, patient or surrogate demonstrates understanding. Appropriately NPO. Plan to proceed with anesthesia.  , MD 10/30/21

## 2021-10-30 NOTE — H&P (Signed)
Arlyss Repress, MD [address]  [address]  [place], [place] [address]  Main: [phone]  Fax: [phone] Pager: [phone]  Primary Care Physician:  Marin Comment, FNP Primary Gastroenterologist:  Dr. Arlyss Repress  Pre-Procedure History & Physical: HPI:  Claire Frederick is a 30 y.o. female is here for an flexible sigmoidoscopy.   Past Medical History:  Diagnosis Date   Anxiety    Bipolar disorder (HCC)    Depression    Ectopic pregnancy 11/24/2016   Hernia, hiatal    Hypothyroidism    Multiple body piercings    including tongue    Past Surgical History:  Procedure Laterality Date   ESOPHAGOGASTRODUODENOSCOPY (EGD) WITH PROPOFOL N/A 10/19/2020   Procedure: ESOPHAGOGASTRODUODENOSCOPY (EGD) WITH BIOPSY;  Surgeon: Midge Minium, MD;  Location: [facility];  Service: Endoscopy;  Laterality: N/A;  UPREG   LAPAROSCOPIC UNILATERAL SALPINGECTOMY Right 11/30/2016   Procedure: LAPAROSCOPIC UNILATERAL SALPINGECTOMY WITH ECTOPIC;  Surgeon: Conard Novak, MD;  Location: [facility];  Service: Gynecology;  Laterality: Right;    Prior to Admission medications   Medication Sig Start Date End Date Taking? Authorizing Provider  FLUoxetine (PROZAC) 20 MG capsule Take by mouth. 06/24/19  Yes [provider]  hydrOXYzine (ATARAX/VISTARIL) 25 MG tablet Take by mouth.   Yes [provider]  loratadine (CLARITIN) 10 MG tablet Take 1 tablet (10 mg total) by mouth daily. For allergies 01/19/17  Yes Nwoko, Nicole Kindred I, NP  pantoprazole (PROTONIX) 40 MG tablet Take 1 tablet (40 mg total) by mouth daily. 10/02/21 10/02/22 Yes Midge Minium, MD  sucralfate (CARAFATE) 1 g tablet Take 1 tablet (1 g total) by mouth 4 (four) times daily. 10/02/21 11/01/21 Yes Midge Minium, MD  acetaminophen (TYLENOL) 325 MG tablet Take 1 tablet (325 mg total) by mouth every 6 (six) hours as needed for moderate pain. 01/19/17   Armandina Stammer I, NP  albuterol (VENTOLIN HFA) 108 (90 Base) MCG/ACT  inhaler Inhale into the lungs every 6 (six) hours as needed for wheezing or shortness of breath.    [provider]  etonogestrel (NEXPLANON) 68 MG IMPL implant 1 each (68 mg total) by Subdermal route once for 1 dose. 02/16/20 10/17/20  Conard Novak, MD  norethindrone (AYGESTIN) 5 MG tablet Take 1 tablet (5 mg total) by mouth daily. 10/10/21   Conard Novak, MD  ondansetron (ZOFRAN) 4 MG tablet Take by mouth.    [provider]    Allergies as of 10/03/2021   (No Known Allergies)    Family History  Problem Relation Age of Onset   Drug abuse Mother    Diabetes Father    Hypercholesterolemia Father    Diabetes Paternal Uncle     Social History   Socioeconomic History   Marital status: Legally Separated    Spouse name: Not on file   Number of children: Not on file   Years of education: Not on file   Highest education level: Not on file  Occupational History   Not on file  Tobacco Use   Smoking status: Former    Packs/day: 0.50    Types: Cigarettes    Quit date: 11/18/2019    Years since quitting: 1.9   Smokeless tobacco: Never  Vaping Use   Vaping Use: Every day  Substance and Sexual Activity   Alcohol use: Yes    Comment: socially   Drug use: Yes    Types: Marijuana   Sexual activity: Yes    Partners:  Male    Birth control/protection: Implant  Other Topics Concern   Not on file  Social History Narrative   Not on file   Social Determinants of Health   Financial Resource Strain: Not on file  Food Insecurity: Not on file  Transportation Needs: Not on file  Physical Activity: Not on file  Stress: Not on file  Social Connections: Not on file  Intimate Partner Violence: Not on file    Review of Systems: See HPI, otherwise negative ROS  Physical Exam: BP 125/66    Pulse 97    Temp (!) 96.8 F (36 C) (Temporal)    Resp 16    Ht 5\' 4"  (1.626 m)    Wt 106.6 kg    SpO2 98%    BMI 40.34 kg/m  General:   Alert,  pleasant and cooperative  in NAD Head:  Normocephalic and atraumatic. Neck:  Supple; no masses or thyromegaly. Lungs:  Clear throughout to auscultation.    Heart:  Regular rate and rhythm. Abdomen:  Soft, nontender and nondistended. Normal bowel sounds, without guarding, and without rebound.   Neurologic:  Alert and  oriented x4;  grossly normal neurologically.  Impression/Plan: Claire Frederick is here for an flexible sigmoidoscopy to be performed for rectal bleeding  Risks, benefits, limitations, and alternatives regarding  flexible sigmoidoscopy have been reviewed with the patient.  Questions have been answered.  All parties agreeable.   Lily Kocher, MD  10/30/2021, 10:43 AM

## 2021-10-30 NOTE — Clinical Note (Incomplete)
Since pt had no sedation during procedure, IV removed and pt walked off unit at discharge

## 2021-10-30 NOTE — Op Note (Addendum)
[facility] Gastroenterology Patient Name: Claire Frederick Procedure Date: 10/30/2021 10:47 AM MRN: [id number] Account #: [id number] Date of Birth: Nov 21, 1990 Admit Type: Outpatient Age: 30 Room: [facility] ENDO ROOM 3 Gender: Female Note Status: Finalized Instrument Name: Upper Endoscope [id number] Procedure:             Flexible Sigmoidoscopy Indications:           Rectal hemorrhage Providers:             Toney Reil MD, MD Medicines:             None Complications:         No immediate complications. Estimated blood loss: None. Procedure:             Pre-Anesthesia Assessment:                        - Prior to the procedure, a History and Physical was                         performed, and patient medications and allergies were                         reviewed. The patient is competent. The risks and                         benefits of the procedure and the sedation options and                         risks were discussed with the patient. All questions                         were answered and informed consent was obtained.                         Patient identification and proposed procedure were                         verified by the physician, the nurse and the                         technician in the pre-procedure area in the procedure                         room in the endoscopy suite. Mental Status                         Examination: alert and oriented. Airway Examination:                         normal oropharyngeal airway and neck mobility.                         Respiratory Examination: clear to auscultation. CV                         Examination: normal. Prophylactic Antibiotics: The                         patient does not require prophylactic antibiotics.  Prior Anticoagulants: The patient has taken no                         previous anticoagulant or antiplatelet agents. ASA                         Grade Assessment: II - A  patient with mild systemic                         disease. After reviewing the risks and benefits, the                         patient was deemed in satisfactory condition to                         undergo the procedure. The anesthesia plan was to use                         general anesthesia. Immediately prior to                         administration of medications, the patient was                         re-assessed for adequacy to receive sedatives. The                         heart rate, respiratory rate, oxygen saturations,                         blood pressure, adequacy of pulmonary ventilation, and                         response to care were monitored throughout the                         procedure. The physical status of the patient was                         re-assessed after the procedure.                        After obtaining informed consent, the scope was passed                         under direct vision. The Endoscope was introduced                         through the anus and advanced to the the rectosigmoid                         junction. The flexible sigmoidoscopy was accomplished                         without difficulty. The patient tolerated the                         procedure well. The quality of the bowel preparation  was adequate. Findings:      A small skin tag was found on perianal exam.      Solid brown stool from rectosigmoid area      Normal rectum on retreflexion Impression:            - Perianal skin tag found on perianal exam.                        - Normal rectum                        - No specimens collected. Recommendation:        - Discharge patient to home.                        - High fiber diet.                        - Use Benefiber one teaspoon PO daily.                        - Miralax 1 capful (17 grams) in 8 ounces of water PO                         daily.                        - Return to my office  PRN. Procedure Code(s):     --- Professional ---                        [phone], 52, Sigmoidoscopy, flexible; diagnostic,                         including collection of specimen(s) by brushing or                         washing, when performed (separate procedure) Diagnosis Code(s):     --- Professional ---                        K64.4, Residual hemorrhoidal skin tags                        K62.5, Hemorrhage of anus and rectum CPT copyright 2019 American Medical Association. All rights reserved. The codes documented in this report are preliminary and upon coder review may  be revised to meet current compliance requirements. Dr. Libby Maw Toney Reil MD, MD 10/30/2021 11:04:24 AM This report has been signed electronically. Number of Addenda: 0 Note Initiated On: 10/30/2021 10:47 AM Estimated Blood Loss:  Estimated blood loss: none.      [facility]

## 2021-10-31 ENCOUNTER — Encounter: Payer: Self-pay | Admitting: Gastroenterology

## 2021-11-07 ENCOUNTER — Ambulatory Visit: Payer: Medicaid Other | Admitting: Gastroenterology

## 2021-11-21 ENCOUNTER — Other Ambulatory Visit: Payer: Self-pay

## 2021-11-21 ENCOUNTER — Ambulatory Visit: Payer: Medicaid Other | Admitting: Gastroenterology

## 2022-01-10 ENCOUNTER — Ambulatory Visit: Payer: Medicaid Other | Admitting: Advanced Practice Midwife

## 2022-01-11 ENCOUNTER — Ambulatory Visit: Payer: Medicaid Other | Admitting: Advanced Practice Midwife

## 2022-02-11 ENCOUNTER — Other Ambulatory Visit: Payer: Self-pay

## 2022-02-11 ENCOUNTER — Emergency Department
Admission: EM | Admit: 2022-02-11 | Discharge: 2022-02-11 | Disposition: A | Discharge status: Home / Self Care | Payer: Medicaid Other | Attending: Student in an Organized Health Care Education/Training Program | Admitting: Student in an Organized Health Care Education/Training Program

## 2022-02-11 DIAGNOSIS — Z3202 Encounter for pregnancy test, result negative: Secondary | ICD-10-CM | POA: Diagnosis not present

## 2022-02-11 DIAGNOSIS — Z3200 Encounter for pregnancy test, result unknown: Secondary | ICD-10-CM | POA: Diagnosis present

## 2022-02-11 LAB — URINALYSIS, ROUTINE W REFLEX MICROSCOPIC
Bilirubin Urine: NEGATIVE
Glucose, UA: NEGATIVE mg/dL
Hgb urine dipstick: NEGATIVE
Ketones, ur: NEGATIVE mg/dL
Leukocytes,Ua: NEGATIVE
Nitrite: NEGATIVE
Protein, ur: NEGATIVE mg/dL
Specific Gravity, Urine: 1.008 (ref 1.005–1.030)
pH: 5 (ref 5.0–8.0)

## 2022-02-11 LAB — BASIC METABOLIC PANEL
Anion gap: 10 (ref 5–15)
BUN: 9 mg/dL (ref 6–20)
CO2: 23 mmol/L (ref 22–32)
Calcium: 9 mg/dL (ref 8.9–10.3)
Chloride: 104 mmol/L (ref 98–111)
Creatinine, Ser: 0.86 mg/dL (ref 0.44–1.00)
GFR, Estimated: >60 mL/min (ref >60)
Glucose, Bld: 90 mg/dL (ref 70–99)
Potassium: 3.9 mmol/L (ref 3.5–5.1)
Sodium: 137 mmol/L (ref 135–145)

## 2022-02-11 LAB — CBC
HCT: 41.9 % (ref 36.0–46.0)
Hemoglobin: 13.5 g/dL (ref 12.0–15.0)
MCH: 29.9 pg (ref 26.0–34.0)
MCHC: 32.2 g/dL (ref 30.0–36.0)
MCV: 92.9 fL (ref 80.0–100.0)
Platelets: 310 10*3/uL (ref 150–400)
RBC: 4.51 MIL/uL (ref 3.87–5.11)
RDW: 13.2 % (ref 11.5–15.5)
WBC: 12.2 10*3/uL — ABNORMAL HIGH (ref 4.0–10.5)
nRBC: 0 % (ref 0.0–0.2)

## 2022-02-11 LAB — POC URINE PREG, ED: Preg Test, Ur: NEGATIVE

## 2022-02-11 LAB — HCG, QUANTITATIVE, PREGNANCY: hCG, Beta Chain, Quant, S: <1 m[IU]/mL (ref <5)

## 2022-02-11 LAB — ABO/RH: ABO/RH(D): A POS

## 2022-02-11 NOTE — ED Provider Notes (Signed)
? ?[facility] ?Provider Note ? ?Patient Contact: 10:57 PM (approximate) ? ? ?History  ? ?Abdominal Pain ? ? ?HPI ? ?Claire Frederick is a 31 y.o. female who presents the emergency department concerned that she may be possibly pregnant.  Patient states that the last time she had sexual intercourse was 25 weeks ago, was informed by her primary care today that she had a positive hCG pregnancy test.  Patient states that she had routine labs performed 3 weeks ago as she was starting a new medication and needed to confirm negative pregnancy.  She was called today stating that she was positive for pregnancy.  She states that she has no symptoms of pregnancy, again has not had sexual intercourse in the last 25 weeks.  Patient denies any other symptoms at this time, is looking for confirmation of pregnancy only. ?  ? ? ?Physical Exam  ? ?Triage Vital Signs: ?ED Triage Vitals [02/11/22 2034]  ?Enc Vitals Group  ?   BP (!) 125/95  ?   Pulse Rate 88  ?   Resp 17  ?   Temp 98.3 ?F (36.8 ?C)  ?   Temp Source Oral  ?   SpO2 98 %  ?   Weight 220 lb (99.8 kg)  ?   Height 5\' 4"  (1.626 m)  ?   Head Circumference   ?   Peak Flow   ?   Pain Score 2  ?   Pain Loc   ?   Pain Edu?   ?   Excl. in GC?   ? ? ?Most recent vital signs: ?Vitals:  ? 02/11/22 2034  ?BP: (!) 125/95  ?Pulse: 88  ?Resp: 17  ?Temp: 98.3 ?F (36.8 ?C)  ?SpO2: 98%  ? ? ? ?General: Alert and in no acute distress.  ?Cardiovascular:  Good peripheral perfusion ?Respiratory: Normal respiratory effort without tachypnea or retractions. Lungs CTAB.  ?Gastrointestinal: Bowel sounds ?4 quadrants. Soft and nontender to palpation. No guarding or rigidity. No palpable masses. No distention.  ?Musculoskeletal: Full range of motion to all extremities.  ?Neurologic:  No gross focal neurologic deficits are appreciated.  ?Skin:   No rash noted ?Other: ? ? ?ED Results / Procedures / Treatments  ? ?Labs ?(all labs ordered are listed, but only abnormal results are  displayed) ?Labs Reviewed  ?URINALYSIS, ROUTINE W REFLEX MICROSCOPIC - Abnormal; Notable for the following components:  ?    Result Value  ? Color, Urine STRAW (*)   ? APPearance CLEAR (*)   ? All other components within normal limits  ?CBC - Abnormal; Notable for the following components:  ? WBC 12.2 (*)   ? All other components within normal limits  ?HCG, QUANTITATIVE, PREGNANCY  ?BASIC METABOLIC PANEL  ?POC URINE PREG, ED  ?ABO/RH  ? ? ? ?EKG ? ? ? ? ?RADIOLOGY ? ? ? ?No results found. ? ?PROCEDURES: ? ?Critical Care performed: No ? ?Procedures ? ? ?MEDICATIONS ORDERED IN ED: ?Medications - No data to display ? ? ?IMPRESSION / MDM / ASSESSMENT AND PLAN / ED COURSE  ?I reviewed the triage vital signs and the nursing notes. ?             ?               ? ?Differential diagnosis includes, but is not limited to, pregnancy, no pregnancy, feared complaint without diagnosis ? ? ?Patient's diagnosis is consistent with encounter for pregnancy test with negative result.  Patient presented to the emergency department concerned that she may be pregnant.  She states that the last time she had sexual intercourse was in the beginning of September roughly 25 weeks ago.  She states that she had no symptoms of pregnancy, had routine labs she was starting a medication that needed to ensure that she was not pregnant.  She got a call from her primary care today stating that she had a positive hCG pregnancy test.  Patient is confused and wants confirmation whether she is in fact pregnant or not.  Her urine pregnancy test is negative as well as her hCG.  Labs are otherwise reassuring.  Given patient has no other symptoms other than needing to confirm whether she is pregnant or not for medication she will be discharged at this time..  No additional work-up at this time.  Follow-up primary care as needed.  Patient is given ED precautions to return to the ED for any worsening or new symptoms. ? ? ? ?  ? ? ?FINAL CLINICAL IMPRESSION(S) /  ED DIAGNOSES  ? ?Final diagnoses:  ?Encounter for pregnancy test with result negative  ? ? ? ?Rx / DC Orders  ? ?ED Discharge Orders   ? ? None  ? ?  ? ? ? ?Note:  This document was prepared using Dragon voice recognition software and may include unintentional dictation errors. ?  ?Racheal Patches, PA-C ?02/11/22 2311 ? ?  ?Willy Eddy, MD ?02/11/22 2318 ? ?

## 2022-02-11 NOTE — ED Triage Notes (Signed)
Pt presents to ER c/o abd cramping, and to r/o pregnancy.  Pt states she feels like she is having lower abd cramping and states she is diet meds, and psychiatric meds and is worried it could be interfering with her poss pregnancy.  Pt states she thinks she is around 25-ish weeks pregnant, but does not know when LMP was.  States she got a call from her doctor saying her HCG was elevated a few days ago.  Pt denies any increased vaginal bleeding.  Pt A&O x4 at this time in NAD in triage.   ?

## 2022-02-20 ENCOUNTER — Other Ambulatory Visit (HOSPITAL_COMMUNITY)
Admission: RE | Admit: 2022-02-20 | Discharge: 2022-02-20 | Disposition: A | Discharge status: Home / Self Care | Payer: Medicaid Other | Source: Ambulatory Visit | Attending: Obstetrics | Admitting: Obstetrics

## 2022-02-20 ENCOUNTER — Encounter: Payer: Self-pay | Admitting: Obstetrics

## 2022-02-20 ENCOUNTER — Ambulatory Visit (INDEPENDENT_AMBULATORY_CARE_PROVIDER_SITE_OTHER): Payer: Medicaid Other | Admitting: Obstetrics

## 2022-02-20 VITALS — BP 118/72 | HR 92 | Ht 64.0 in | Wt 217.0 lb

## 2022-02-20 DIAGNOSIS — B001 Herpesviral vesicular dermatitis: Secondary | ICD-10-CM | POA: Diagnosis not present

## 2022-02-20 DIAGNOSIS — Z113 Encounter for screening for infections with a predominantly sexual mode of transmission: Secondary | ICD-10-CM

## 2022-02-20 MED ORDER — ACYCLOVIR 5 % EX OINT: 1.0000 "application " | TOPICAL_OINTMENT | CUTANEOUS | 0 refills | Status: AC

## 2022-02-20 NOTE — Progress Notes (Signed)
. ? ? ?Obstetrics & Gynecology Office Visit  ? ?Chief Complaint:  ?Chief Complaint  ?Patient presents with  ? Exposure to STD  ?  recent IC w/person who had a cold sore who advised her after that he also has HPV.  ? ? ?History of Present Illness: Claire Frederick comes requesting some STI testing. She had recent IC and interaction with a new partner, and several days later developed a sore on her upper lip. She expresses anxiety about this begin a cold sore and would like testing. ?She denies any vaginal discharge, but has noticed some irritation along her labia. ?  ?Review of Systems:  ?Review of Systems  ?Constitutional: Negative.   ?HENT: Negative.    ?Eyes: Negative.   ?Respiratory: Negative.    ?Cardiovascular: Negative.   ?Gastrointestinal: Negative.   ?Genitourinary: Negative.   ?Musculoskeletal: Negative.   ?Skin:   ?     Labial irritation. ?Small red blister noted on middle of upper lip.  ?Neurological: Negative.   ?Endo/Heme/Allergies: Negative.   ?Psychiatric/Behavioral: Negative.     ? ?Past Medical History:  ?Past Medical History:  ?Diagnosis Date  ? Anxiety   ? Bipolar disorder ([place])   ? Depression   ? Ectopic pregnancy 11/24/2016  ? Hernia, hiatal   ? Hypothyroidism   ? Multiple body piercings   ? including tongue  ? ? ?Past Surgical History:  ?Past Surgical History:  ?Procedure Laterality Date  ? ESOPHAGOGASTRODUODENOSCOPY (EGD) WITH PROPOFOL N/A 10/19/2020  ? Procedure: ESOPHAGOGASTRODUODENOSCOPY (EGD) WITH BIOPSY;  Surgeon: Lucilla Lame, MD;  Location: [place];  Service: Endoscopy;  Laterality: N/A;  UPREG  ? FLEXIBLE SIGMOIDOSCOPY N/A 10/30/2021  ? Procedure: FLEXIBLE SIGMOIDOSCOPY;  Surgeon: Lin Landsman, MD;  Location: [facility];  Service: Gastroenterology;  Laterality: N/A;  Fleet enema 1 hr prior  ? LAPAROSCOPIC UNILATERAL SALPINGECTOMY Right 11/30/2016  ? Procedure: LAPAROSCOPIC UNILATERAL SALPINGECTOMY WITH ECTOPIC;  Surgeon: Will Bonnet, MD;  Location: [facility];  Service:  Gynecology;  Laterality: Right;  ? ? ?Gynecologic History: No LMP recorded. Patient has had an implant. ? ?Obstetric History: [id number] ? ?Family History:  ?Family History  ?Problem Relation Age of Onset  ? Drug abuse Mother   ? Diabetes Father   ? Hypercholesterolemia Father   ? Diabetes Paternal Uncle   ? ? ?Social History:  ?Social History  ? ?Socioeconomic History  ? Marital status: Legally Separated  ?  Spouse name: Not on file  ? Number of children: Not on file  ? Years of education: Not on file  ? Highest education level: Not on file  ?Occupational History  ? Not on file  ?Tobacco Use  ? Smoking status: Former  ?  Packs/day: 0.50  ?  Types: Cigarettes  ?  Quit date: 11/18/2019  ?  Years since quitting: 2.2  ? Smokeless tobacco: Never  ?Vaping Use  ? Vaping Use: Every day  ?Substance and Sexual Activity  ? Alcohol use: Yes  ?  Comment: socially  ? Drug use: Yes  ?  Types: Marijuana  ? Sexual activity: Yes  ?  Partners: Male  ?  Birth control/protection: Implant  ?Other Topics Concern  ? Not on file  ?Social History Narrative  ? Not on file  ? ?Social Determinants of Health  ? ?Financial Resource Strain: Not on file  ?Food Insecurity: Not on file  ?Transportation Needs: Not on file  ?Physical Activity: Not on file  ?Stress: Not on file  ?Social Connections: Not on file  ?  Intimate Partner Violence: Not on file  ? ? ?Allergies:  ?No Known Allergies ? ?Medications: ?Prior to Admission medications   ?Medication Sig Start Date End Date Taking? Authorizing Provider  ?acetaminophen (TYLENOL) 325 MG tablet Take 1 tablet (325 mg total) by mouth every 6 (six) hours as needed for moderate pain. 01/19/17  Yes Lindell Spar I, NP  ?albuterol (VENTOLIN HFA) 108 (90 Base) MCG/ACT inhaler Inhale into the lungs every 6 (six) hours as needed for wheezing or shortness of breath.   Yes [provider]  ?FLUoxetine (PROZAC) 20 MG capsule Take by mouth. 06/24/19  Yes [provider]  ?fluticasone (FLONASE) 50 MCG/ACT  nasal spray 2 sprays into each nostril daily. 12/15/21 12/15/22 Yes [provider]  ?gabapentin (NEURONTIN) 300 MG capsule Take by mouth.   Yes [provider]  ?hydrOXYzine (ATARAX/VISTARIL) 25 MG tablet Take by mouth.   Yes [provider]  ?loratadine (CLARITIN) 10 MG tablet Take 1 tablet (10 mg total) by mouth daily. For allergies 01/19/17  Yes Lindell Spar I, NP  ?norethindrone (AYGESTIN) 5 MG tablet Take 1 tablet (5 mg total) by mouth daily. 10/10/21  Yes Will Bonnet, MD  ?ondansetron (ZOFRAN) 4 MG tablet Take by mouth.   Yes [provider]  ?pantoprazole (PROTONIX) 40 MG tablet Take 1 tablet (40 mg total) by mouth daily. 10/02/21 10/02/22 Yes Lucilla Lame, MD  ?tiZANidine (ZANAFLEX) 2 MG tablet Take by mouth. 03/25/21  Yes [provider]  ?etonogestrel (NEXPLANON) 68 MG IMPL implant 1 each (68 mg total) by Subdermal route once for 1 dose. 02/16/20 10/17/20  Will Bonnet, MD  ?sucralfate (CARAFATE) 1 g tablet Take 1 tablet (1 g total) by mouth 4 (four) times daily. 10/02/21 11/01/21  Lucilla Lame, MD  ? ? ?Physical Exam ?Vitals:  ?Vitals:  ? 02/20/22 1530  ?BP: 118/72  ?Pulse: 92  ? ?No LMP recorded. Patient has had an implant. ? ?Physical Exam ?Constitutional:   ?   Appearance: Normal appearance. She is obese.  ?Cardiovascular:  ?   Rate and Rhythm: Normal rate and regular rhythm.  ?   Pulses: Normal pulses.  ?   Heart sounds: Normal heart sounds.  ?Pulmonary:  ?   Effort: Pulmonary effort is normal.  ?   Breath sounds: Normal breath sounds.  ?Abdominal:  ?   Palpations: Abdomen is soft.  ?Genitourinary: ?   General: Normal vulva.  ?   Rectum: Normal.  ?   Comments: Normal external genitalia. Some slightly pink to red areas around her labia. Normal vaginal rugae and no discharge or malodor noted. Aptima swab retrieved. ?Musculoskeletal:  ?   Cervical back: Normal range of motion and neck supple.  ?Neurological:  ?   Mental Status: She is alert.   ? ? ? ?Assessment: 31 y.o. 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 ?For STI screening and HSV blood draw ? ? ?Plan: ?Problem List Items Addressed This Visit   ?None ?Visit Diagnoses   ? ? Screen for STD (sexually transmitted disease)    -  Primary  ? Relevant Orders  ? Cervicovaginal ancillary only  ? HSV(herpes simplex vrs) 1+2 ab-IgG  ? ?  ? ?She is already treating with oral Abreva. Will order some topical valtex gel. ?Plan to notify her of the lab results. I have expalined that as her contact was very recent, we may not get accurate results from the HSV labs. ? ?Imagene Riches, CNM  ?02/20/2022 5:26 PM  ? ? ? ?

## 2022-02-21 LAB — HSV(HERPES SIMPLEX VRS) I + II AB-IGG
HSV 1 Glycoprotein G Ab, IgG: 44 index — ABNORMAL HIGH (ref 0.00–0.90)
HSV 2 IgG, Type Spec: <0.91 index (ref 0.00–0.90)

## 2022-02-21 NOTE — Progress Notes (Signed)
Patient is called and vm left  with only brief notes regarding her test results. She is offered opportunity to call and speak with this provider after the Easter weekend. ?Mirna Mires, CNM  ?02/21/2022 5:40 PM  ? ?

## 2022-02-22 LAB — CERVICOVAGINAL ANCILLARY ONLY
Bacterial Vaginitis (gardnerella): POSITIVE — AB
Chlamydia: NEGATIVE
Comment: NEGATIVE
Comment: NEGATIVE
Comment: NEGATIVE
Comment: NORMAL
Neisseria Gonorrhea: NEGATIVE
Trichomonas: NEGATIVE

## 2022-02-25 ENCOUNTER — Other Ambulatory Visit: Payer: Self-pay | Admitting: Obstetrics

## 2022-02-25 ENCOUNTER — Encounter: Payer: Self-pay | Admitting: Obstetrics

## 2022-02-25 DIAGNOSIS — B9689 Other specified bacterial agents as the cause of diseases classified elsewhere: Secondary | ICD-10-CM

## 2022-02-25 MED ORDER — METRONIDAZOLE 500 MG PO TABS: 500.0000 mg | ORAL_TABLET | Freq: Two times a day (BID) | ORAL | 0 refills | Status: AC

## 2022-02-25 NOTE — Progress Notes (Signed)
Aptima swab detects BV. Rx for metronidazole sent for patient , and she is notified via My Chart. ?Mirna Mires, CNM  ?02/25/2022 8:37 AM  ? ?

## 2022-03-06 ENCOUNTER — Telehealth: Payer: Self-pay | Admitting: Gastroenterology

## 2022-03-06 NOTE — Telephone Encounter (Signed)
Informed patient if she is vomiting blood she needs to go to the ER. She states she just got off work and she is pulling in to the [place] ER right now.  ?

## 2022-03-06 NOTE — Telephone Encounter (Signed)
Pt is requesting to be seen today if possible she states that she is vomiting blood and the ED wait could be as long as 12 hours ?

## 2022-05-20 ENCOUNTER — Telehealth: Payer: Self-pay

## 2022-05-20 DIAGNOSIS — G8929 Other chronic pain: Secondary | ICD-10-CM

## 2022-05-20 DIAGNOSIS — R1900 Intra-abdominal and pelvic swelling, mass and lump, unspecified site: Secondary | ICD-10-CM

## 2022-05-20 NOTE — Telephone Encounter (Signed)
Pt lmovm requesting a call back in reference to being seen soon in reference to her ongoing GI issues

## 2022-05-23 NOTE — Telephone Encounter (Signed)
Patient was last seen on 10/02/2021 for rectal bleeding. She no showed appointment on 11/21/21. Patient states she has a hiatal hernia. She states she has had swelling in stomach off and on. She states sometimes it is so swollen and feels like pregnancy belly right under her breast. She states there are days it looks like a basketball. She states it is sore to touch. She states she is taking pantoprazole and Carafate. Patient has had constipation off and on her whole life but states she has been constipated for 1 week. She states even when she is not constipated she has the swelling in her stomach.

## 2022-05-24 ENCOUNTER — Telehealth: Payer: Self-pay

## 2022-05-24 ENCOUNTER — Ambulatory Visit: Payer: Medicaid Other | Admitting: Nurse Practitioner

## 2022-05-24 ENCOUNTER — Encounter: Payer: Self-pay | Admitting: Nurse Practitioner

## 2022-05-24 VITALS — BP 128/84 | HR 76 | Temp 98.5°F | Resp 16 | Ht 64.0 in | Wt 224.0 lb

## 2022-05-24 DIAGNOSIS — Z8719 Personal history of other diseases of the digestive system: Secondary | ICD-10-CM

## 2022-05-24 DIAGNOSIS — E782 Mixed hyperlipidemia: Secondary | ICD-10-CM

## 2022-05-24 DIAGNOSIS — F3181 Bipolar II disorder: Secondary | ICD-10-CM

## 2022-05-24 DIAGNOSIS — R300 Dysuria: Secondary | ICD-10-CM

## 2022-05-24 DIAGNOSIS — E559 Vitamin D deficiency, unspecified: Secondary | ICD-10-CM

## 2022-05-24 DIAGNOSIS — R1319 Other dysphagia: Secondary | ICD-10-CM

## 2022-05-24 DIAGNOSIS — M064 Inflammatory polyarthropathy: Secondary | ICD-10-CM | POA: Diagnosis not present

## 2022-05-24 DIAGNOSIS — Z8619 Personal history of other infectious and parasitic diseases: Secondary | ICD-10-CM | POA: Diagnosis not present

## 2022-05-24 DIAGNOSIS — R5383 Other fatigue: Secondary | ICD-10-CM

## 2022-05-24 DIAGNOSIS — F603 Borderline personality disorder: Secondary | ICD-10-CM

## 2022-05-24 DIAGNOSIS — Z7689 Persons encountering health services in other specified circumstances: Secondary | ICD-10-CM

## 2022-05-24 DIAGNOSIS — Z79899 Other long term (current) drug therapy: Secondary | ICD-10-CM

## 2022-05-24 DIAGNOSIS — E538 Deficiency of other specified B group vitamins: Secondary | ICD-10-CM

## 2022-05-24 LAB — POCT URINALYSIS DIPSTICK
Bilirubin, UA: NEGATIVE
Blood, UA: NEGATIVE
Glucose, UA: NEGATIVE
Leukocytes, UA: NEGATIVE
Nitrite, UA: NEGATIVE
Protein, UA: NEGATIVE
Spec Grav, UA: 1.01
Urobilinogen, UA: 0.2 U/dL
pH, UA: 7.5

## 2022-05-24 MED ORDER — LITHIUM CARBONATE 300 MG PO TABS: 300.0000 mg | ORAL_TABLET | Freq: Three times a day (TID) | ORAL | 2 refills | Status: DC

## 2022-05-24 NOTE — Telephone Encounter (Signed)
Lvm notifying patient of Ne[facility] CT appointment date, time and location-Claire Frederick
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
## 2022-05-27 NOTE — Telephone Encounter (Signed)
Called patient and patient verbalized understanding of instructions  ?

## 2022-05-27 NOTE — Telephone Encounter (Signed)
Recommend H. pylori breath test and H. pylori IgG since patient is on Protonix.  If this is negative, recommend EGD  RV

## 2022-05-27 NOTE — Addendum Note (Signed)
Addended by: Radene Knee L on: 05/27/2022 04:16 PM   Modules accepted: Orders

## 2022-05-28 LAB — CBC WITH DIFFERENTIAL/PLATELET
Basophils Absolute: 0 10*3/uL (ref 0.0–0.2)
Basos: 0 %
EOS (ABSOLUTE): 0.1 10*3/uL (ref 0.0–0.4)
Eos: 2 %
Hematocrit: 36.6 % (ref 34.0–46.6)
Hemoglobin: 12.3 g/dL (ref 11.1–15.9)
Immature Grans (Abs): 0 10*3/uL (ref 0.0–0.1)
Immature Granulocytes: 0 %
Lymphocytes Absolute: 2.7 10*3/uL (ref 0.7–3.1)
Lymphs: 33 %
MCH: 31.7 pg (ref 26.6–33.0)
MCHC: 33.6 g/dL (ref 31.5–35.7)
MCV: 94 fL (ref 79–97)
Monocytes Absolute: 0.5 10*3/uL (ref 0.1–0.9)
Monocytes: 6 %
Neutrophils Absolute: 4.9 10*3/uL (ref 1.4–7.0)
Neutrophils: 59 %
Platelets: 255 10*3/uL (ref 150–450)
RBC: 3.88 x10E6/uL (ref 3.77–5.28)
RDW: 12.3 % (ref 11.7–15.4)
WBC: 8.3 10*3/uL (ref 3.4–10.8)

## 2022-05-28 LAB — CMP14+EGFR
ALT: 11 IU/L (ref 0–32)
AST: 10 IU/L (ref 0–40)
Albumin/Globulin Ratio: 1.8 (ref 1.2–2.2)
Albumin: 4.2 g/dL (ref 3.9–4.9)
Alkaline Phosphatase: 44 IU/L (ref 44–121)
BUN/Creatinine Ratio: 8 — ABNORMAL LOW (ref 9–23)
BUN: 7 mg/dL (ref 6–20)
Bilirubin Total: 0.2 mg/dL (ref 0.0–1.2)
CO2: 19 mmol/L — ABNORMAL LOW (ref 20–29)
Calcium: 8.9 mg/dL (ref 8.7–10.2)
Chloride: 102 mmol/L (ref 96–106)
Creatinine, Ser: 0.93 mg/dL (ref 0.57–1.00)
Globulin, Total: 2.3 g/dL (ref 1.5–4.5)
Glucose: 86 mg/dL (ref 70–99)
Potassium: 4.4 mmol/L (ref 3.5–5.2)
Sodium: 139 mmol/L (ref 134–144)
Total Protein: 6.5 g/dL (ref 6.0–8.5)
eGFR: 84 mL/min/{1.73_m2} (ref >59)

## 2022-05-28 LAB — RHEUMATOID FACTOR: Rheumatoid fact SerPl-aCnc: <10 IU/mL (ref <14.0)

## 2022-05-28 LAB — LIPID PANEL
Chol/HDL Ratio: 3.8 ratio (ref 0.0–4.4)
Cholesterol, Total: 180 mg/dL (ref 100–199)
HDL: 47 mg/dL (ref >39)
LDL Chol Calc (NIH): 119 mg/dL — ABNORMAL HIGH (ref 0–99)
Triglycerides: 74 mg/dL (ref 0–149)
VLDL Cholesterol Cal: 14 mg/dL (ref 5–40)

## 2022-05-28 LAB — LIPASE: Lipase: 32 U/L (ref 14–72)

## 2022-05-28 LAB — B12 AND FOLATE PANEL
Folate: 10.1 ng/mL (ref >3.0)
Vitamin B-12: 259 pg/mL (ref 232–1245)

## 2022-05-28 LAB — VITAMIN D 25 HYDROXY (VIT D DEFICIENCY, FRACTURES): Vit D, 25-Hydroxy: 22.9 ng/mL — ABNORMAL LOW (ref 30.0–100.0)

## 2022-05-28 LAB — SEDIMENTATION RATE: Sed Rate: 27 mm/hr (ref 0–32)

## 2022-05-28 LAB — C-REACTIVE PROTEIN: CRP: 3 mg/L (ref 0–10)

## 2022-05-28 LAB — ANA W/REFLEX IF POSITIVE: Anti Nuclear Antibody (ANA): NEGATIVE

## 2022-05-28 LAB — TSH+FREE T4
Free T4: 1.13 ng/dL (ref 0.82–1.77)
TSH: 0.976 u[IU]/mL (ref 0.450–4.500)

## 2022-05-28 LAB — LITHIUM LEVEL: Lithium Lvl: <0.1 mmol/L — ABNORMAL LOW (ref 0.5–1.2)

## 2022-05-29 MED ORDER — PANTOPRAZOLE SODIUM 40 MG PO TBEC: 40.0000 mg | DELAYED_RELEASE_TABLET | Freq: Two times a day (BID) | ORAL | 0 refills | Status: DC

## 2022-05-29 NOTE — Telephone Encounter (Signed)
Increase protonix to twice daily and stay on liquid diet only for 24hrs. If her symptoms aren't improving, either go to ER or I can see in last week of July  RV

## 2022-05-29 NOTE — Addendum Note (Signed)
Addended by: Radene Knee L on: 05/29/2022 09:27 AM   Modules accepted: Orders

## 2022-05-29 NOTE — Telephone Encounter (Signed)
Called and left a message for call back  

## 2022-05-29 NOTE — Telephone Encounter (Signed)
Patient verbalized understanding of instructions. Sent medication to the pharmacy and she will let us know if she is not improving

## 2022-05-29 NOTE — Telephone Encounter (Signed)
Patient is going to go for lab work today. She states for the last two days she has had vomiting every time she eats and sometimes she can not even keep liquid down. She states that she is so nausea. She states you can see a outline of a circle that is hard in her upper stomach. She states it looks like she is pregnant in the upper half. She states it hurt to touch and breath in. She wants to know if you think the hiatal hernia has grown or rupture.

## 2022-05-30 ENCOUNTER — Ambulatory Visit
Admission: RE | Admit: 2022-05-30 | Discharge: 2022-05-30 | Disposition: A | Discharge status: Home / Self Care | Payer: Medicaid Other | Source: Ambulatory Visit | Attending: Nurse Practitioner | Admitting: Nurse Practitioner

## 2022-05-30 DIAGNOSIS — R1319 Other dysphagia: Secondary | ICD-10-CM | POA: Diagnosis present

## 2022-05-30 MED ORDER — IOHEXOL 300 MG/ML  SOLN
75.0000 mL | Freq: Once | INTRAMUSCULAR | Status: AC | PRN
  Administered 2022-05-30: 75 mL via INTRAVENOUS

## 2022-05-31 LAB — H. PYLORI ANTIBODY, IGG: H. pylori, IgG AbS: 0.22 Index Value (ref 0.00–0.79)

## 2022-06-01 LAB — H. PYLORI BREATH TEST: H pylori Breath Test: NEGATIVE

## 2022-06-03 ENCOUNTER — Telehealth: Payer: Self-pay

## 2022-06-03 ENCOUNTER — Other Ambulatory Visit: Payer: Self-pay

## 2022-06-03 DIAGNOSIS — G8929 Other chronic pain: Secondary | ICD-10-CM

## 2022-06-03 NOTE — Telephone Encounter (Signed)
-----   Message from Toney Reil, MD sent at 06/03/2022  9:07 AM EDT ----- H pylori tests came back negative, Recommend EGD if pt is agreeable  RV

## 2022-06-03 NOTE — Telephone Encounter (Signed)
Called patient and patient scheduled EGD for 06/18/2022 with Dr. Allegra Lai. Went over instructions and sent to [employer]

## 2022-06-04 ENCOUNTER — Ambulatory Visit: Payer: Medicaid Other | Admitting: Obstetrics

## 2022-06-05 ENCOUNTER — Telehealth: Payer: Self-pay

## 2022-06-05 NOTE — Telephone Encounter (Addendum)
Pt called that she is throwing up brown and blood and her stomach bloating as per dfk advised her lipase and liver enzymes are normal as per dr Welton Flakes advised that she need to call her GI and also  go to ED  if no response from GI and also we discuss with her labs in detail at her next visit

## 2022-06-05 NOTE — Telephone Encounter (Signed)
FYI  Pt c/o 2 week abd swelling, pain, pressure... vomiting yellow x 2 weeks, now hematemesis x 1 week... Denies fever at this time... Pt reports she is not able to keep food down as well.... Pt advised to go to ED now, she is agreeable and states she will go to [facility] ED

## 2022-06-13 ENCOUNTER — Ambulatory Visit: Payer: Medicaid Other | Admitting: Nurse Practitioner

## 2022-06-13 ENCOUNTER — Other Ambulatory Visit: Payer: Self-pay | Admitting: Nurse Practitioner

## 2022-06-13 ENCOUNTER — Encounter: Payer: Self-pay | Admitting: Nurse Practitioner

## 2022-06-13 VITALS — BP 127/84 | HR 78 | Temp 98.4°F | Resp 16 | Ht 64.0 in | Wt 228.4 lb

## 2022-06-13 DIAGNOSIS — Z79899 Other long term (current) drug therapy: Secondary | ICD-10-CM | POA: Diagnosis not present

## 2022-06-13 DIAGNOSIS — M064 Inflammatory polyarthropathy: Secondary | ICD-10-CM | POA: Diagnosis not present

## 2022-06-13 DIAGNOSIS — E538 Deficiency of other specified B group vitamins: Secondary | ICD-10-CM

## 2022-06-13 DIAGNOSIS — F3181 Bipolar II disorder: Secondary | ICD-10-CM | POA: Diagnosis not present

## 2022-06-13 DIAGNOSIS — F331 Major depressive disorder, recurrent, moderate: Secondary | ICD-10-CM

## 2022-06-13 DIAGNOSIS — E559 Vitamin D deficiency, unspecified: Secondary | ICD-10-CM | POA: Diagnosis not present

## 2022-06-13 MED ORDER — VITAMIN D (ERGOCALCIFEROL) 1.25 MG (50000 UNIT) PO CAPS: 50000.0000 [IU] | ORAL_CAPSULE | ORAL | 1 refills | Status: DC

## 2022-06-13 MED ORDER — CARIPRAZINE HCL 1.5 MG PO CAPS: 1.5000 mg | ORAL_CAPSULE | Freq: Every day | ORAL | 3 refills | Status: DC

## 2022-06-13 MED ORDER — CYANOCOBALAMIN 1000 MCG/ML IJ SOLN
1000.0000 ug | Freq: Once | INTRAMUSCULAR | Status: AC
  Administered 2022-06-13: 1000 ug via INTRAMUSCULAR

## 2022-06-13 NOTE — Progress Notes (Signed)
[facility] [address] [place], [place] [address]  Internal MEDICINE  Office Visit Note  Patient Name: Claire Frederick  [id number]  [id number]  Date of Service: 06/13/2022  Chief Complaint  Patient presents with   Follow-up   Depression   Anxiety   Asthma   Results    HPI Claire Frederick presents for a follow-up visit for bipolar, anxiety and depression, and to review lab results. Patient was started on lithium at her previous office visit, she has not noticed any significant difference since starting lithium and denies any significant adverse side effects or other issues.  She had her lithium level drawn today.  Does report that she tends to have more depressive symptoms in regard to her bipolar diagnosis and has a comorbid diagnosis of major depressive disorder, may need adjunct medication to cover depressive symptoms more effectively Lab results reviewed with the patient: Her labs were drawn on 7/19 --she had an elevated WBC of 15.0. Her metabolic panel was normal with normal liver and kidney function.  These labs were from an ED visit at [facility]. -- Her rheumatoid factor was normal, ANA negative, sed rate and C-reactive protein were normal.  Thyroid levels were normal.  Vitamin D level is low at 22.9.  B12 is low at 259 and folate is normal.  Lipase level was also checked since she has had pancreatitis in the past and it was normal, she is being followed by gastroenterology.  Her most recent lipase level which was from the ED visit on July 19 did have a slightly elevated lipase of 57 but she was not admitted. -- Patient reports that her stress level is elevated, she is in the process of getting divorced.    Current Medication: Outpatient Encounter Medications as of 06/13/2022  Medication Sig Note   acetaminophen (TYLENOL) 325 MG tablet Take 1 tablet (325 mg total) by mouth every 6 (six) hours as needed for moderate pain.    acyclovir ointment (ZOVIRAX) 5 % Apply 1 application.  topically every 3 (three) hours.    albuterol (VENTOLIN HFA) 108 (90 Base) MCG/ACT inhaler Inhale into the lungs every 6 (six) hours as needed for wheezing or shortness of breath.    cariprazine (VRAYLAR) 1.5 MG capsule Take 1 capsule (1.5 mg total) by mouth daily.    fluticasone (FLONASE) 50 MCG/ACT nasal spray 2 sprays into each nostril daily.    hydrOXYzine (ATARAX/VISTARIL) 25 MG tablet Take by mouth.    lithium 300 MG tablet Take 1 tablet (300 mg total) by mouth 3 (three) times daily.    loratadine (CLARITIN) 10 MG tablet Take 1 tablet (10 mg total) by mouth daily. For allergies    norethindrone (AYGESTIN) 5 MG tablet Take 1 tablet (5 mg total) by mouth daily.    ondansetron (ZOFRAN) 4 MG tablet Take by mouth. 10/19/2020: Last had to use approx 2-3 months ago   pantoprazole (PROTONIX) 40 MG tablet Take 1 tablet (40 mg total) by mouth 2 (two) times daily before a meal.    tiZANidine (ZANAFLEX) 2 MG tablet Take by mouth.    Vitamin D, Ergocalciferol, (DRISDOL) 1.25 MG (50000 UNIT) CAPS capsule Take 1 capsule (50,000 Units total) by mouth every 7 (seven) days.    etonogestrel (NEXPLANON) 68 MG IMPL implant 1 each (68 mg total) by Subdermal route once for 1 dose.    sucralfate (CARAFATE) 1 g tablet Take 1 tablet (1 g total) by mouth 4 (four) times daily.    [EXPIRED] cyanocobalamin (  VITAMIN B12) injection 1,000 mcg     No facility-administered encounter medications on file as of 06/13/2022.    Surgical History: Past Surgical History:  Procedure Laterality Date   ESOPHAGOGASTRODUODENOSCOPY (EGD) WITH PROPOFOL N/A 10/19/2020   Procedure: ESOPHAGOGASTRODUODENOSCOPY (EGD) WITH BIOPSY;  Surgeon: Lucilla Lame, MD;  Location: [facility];  Service: Endoscopy;  Laterality: N/A;  UPREG   ESOPHAGOGASTRODUODENOSCOPY (EGD) WITH PROPOFOL N/A 06/18/2022   Procedure: ESOPHAGOGASTRODUODENOSCOPY (EGD) WITH PROPOFOL;  Surgeon: Lin Landsman, MD;  Location: [facility];  Service: Gastroenterology;   Laterality: N/A;   FLEXIBLE SIGMOIDOSCOPY N/A 10/30/2021   Procedure: FLEXIBLE SIGMOIDOSCOPY;  Surgeon: Lin Landsman, MD;  Location: [facility] ENDOSCOPY;  Service: Gastroenterology;  Laterality: N/A;  Fleet enema 1 hr prior   LAPAROSCOPIC UNILATERAL SALPINGECTOMY Right 11/30/2016   Procedure: LAPAROSCOPIC UNILATERAL SALPINGECTOMY WITH ECTOPIC;  Surgeon: Will Bonnet, MD;  Location: [facility] ORS;  Service: Gynecology;  Laterality: Right;    Medical History: Past Medical History:  Diagnosis Date   Anxiety    Asthma    Bipolar disorder ([place])    Carpal tunnel syndrome, bilateral    both wrists   Depression    Ectopic pregnancy 11/24/2016   Endometriosis    Fibromyalgia    Hearing loss, 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, hard of hearing   Hernia, hiatal    Herpes simplex virus type 1 (HSV-1) dermatitis    Hypothyroidism    Multiple body piercings    including tongue   Plantar fasciitis, bilateral    Sciatica     Family History: Family History  Problem Relation Age of Onset   Depression Mother    Cancer Mother    Arthritis Mother    Anxiety disorder Mother    Alcohol abuse Mother    ADD / ADHD Mother    Drug abuse Mother    Breast cancer Mother    Asthma Father    Hypercholesterolemia Father    Drug abuse Sister    Alcohol abuse Brother    Obesity Son    Depression Son    Asthma Son    Anxiety disorder Son    ADD / ADHD Son    Cancer Paternal Aunt    Ovarian cancer Paternal Aunt    Diabetes Paternal Uncle    Obesity Maternal Grandmother    Depression Maternal Grandmother    Cancer Maternal Grandmother    Anxiety disorder Maternal Grandmother    Breast cancer Maternal Grandmother    Cancer Maternal Grandfather    Lung cancer Maternal Grandfather    Cancer Paternal Grandmother    Ovarian cancer Paternal Grandmother     Social History   Socioeconomic History   Marital status: Legally Separated    Spouse name: Not on file   Number of children: Not on file    Years of education: Not on file   Highest education level: Not on file  Occupational History   Not on file  Tobacco Use   Smoking status: Former    Packs/day: 0.50    Types: Cigarettes    Quit date: 11/18/2019    Years since quitting: 2.6   Smokeless tobacco: Never  Vaping Use   Vaping Use: Every day  Substance and Sexual Activity   Alcohol use: Yes    Comment: socially   Drug use: Yes    Types: Marijuana   Sexual activity: Yes    Partners: Male    Birth control/protection: Implant  Other Topics Concern  Not on file  Social History Narrative   Not on file   Social Determinants of Health   Financial Resource Strain: Not on file  Food Insecurity: Not on file  Transportation Needs: Not on file  Physical Activity: Not on file  Stress: Not on file  Social Connections: Not on file  Intimate Partner Violence: Not on file      Review of Systems  Constitutional:  Positive for appetite change and fatigue. Negative for chills, fever and unexpected weight change.  HENT:  Positive for postnasal drip, rhinorrhea, sneezing and trouble swallowing. Negative for sinus pressure, sinus pain and tinnitus.        Hair loss  Eyes: Negative.   Respiratory:  Positive for shortness of breath and wheezing. Negative for cough and chest tightness.   Cardiovascular: Negative.  Negative for chest pain and palpitations.  Gastrointestinal:  Positive for abdominal distention, abdominal pain and nausea.       Has a hiatal hernia  Endocrine: Negative.   Genitourinary: Negative.  Negative for dysuria.  Musculoskeletal:  Positive for arthralgias, back pain and myalgias.  Skin: Negative.   Neurological:  Positive for dizziness, weakness, numbness and headaches.  Hematological: Negative.   Psychiatric/Behavioral:  Positive for behavioral problems, decreased concentration, dysphoric mood and sleep disturbance. Negative for agitation, confusion, hallucinations, self-injury and suicidal ideas. The  patient is nervous/anxious. The patient is not hyperactive.     Vital Signs: BP 127/84   Pulse 78   Temp 98.4 F (36.9 C)   Resp 16   Ht 5\' 4"  (1.626 m)   Wt 228 lb 6.4 oz (103.6 kg)   SpO2 99%   BMI 39.20 kg/m    Physical Exam Vitals reviewed.  Constitutional:      General: She is not in acute distress.    Appearance: Normal appearance. She is obese. She is not ill-appearing.  HENT:     Head: Normocephalic and atraumatic.  Eyes:     Pupils: Pupils are equal, round, and reactive to light.  Cardiovascular:     Rate and Rhythm: Normal rate and regular rhythm.  Pulmonary:     Effort: Pulmonary effort is normal. No respiratory distress.  Abdominal:     General: There is distension.     Palpations: There is no mass.  Neurological:     Mental Status: She is alert and oriented to person, place, and time.  Psychiatric:        Attention and Perception: Attention normal.        Mood and Affect: Mood is anxious and depressed.        Speech: Speech normal.        Behavior: Behavior normal. Behavior is cooperative.        Thought Content: Thought content normal. Thought content is not paranoid. Thought content does not include homicidal or suicidal ideation.        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  2. B12 deficiency B12 injection administered today in office.  Continue weekly B12 injection x2 and then continue monthly x5 more doses - cyanocobalamin (VITAMIN B12) injection 1,000 mcg  3. Vitamin D deficiency Prescribed weekly vitamin D 50,000 unit capsule x6 months and then will repeat vitamin D level - Vitamin D, Ergocalciferol, (DRISDOL) 1.25 MG (50000 UNIT) CAPS capsule; Take 1 capsule (50,000 Units total) by mouth every 7 (seven) days.  Dispense: 12 capsule; Refill:  1  4. Lithium use Continue lithium as prescribed.   Lithium level was within therapeutic range today at 0.9  5. Bipolar 2 disorder, major depressive episode (HCC) Patient reports having increased depressive symptoms and also has a history of major depressive disorder comorbid with bipolar type II, will try Vraylar as an adjunct to lithium to help treat the depressive symptoms.  Samples provided to patient and medication prescription sent to pharmacy.  Her insurance does require failure of 1 preferred atypical antipsychotic before Vraylar can be prescribed.  Patient has previously been on Abilify and was not an effective choice. - cariprazine (VRAYLAR) 1.5 MG capsule; Take 1 capsule (1.5 mg total) by mouth daily.  Dispense: 30 capsule; Refill: 3  6. Moderate episode of recurrent major depressive disorder (HCC) Samples of Vraylar provided to patient in office today and prescription sent to pharmacy, see problem #5 for more details - cariprazine (VRAYLAR) 1.5 MG capsule; Take 1 capsule (1.5 mg total) by mouth daily.  Dispense: 30 capsule; Refill: 3   General Counseling: Lydiann verbalizes understanding of the findings of todays visit and agrees with plan of treatment. I have discussed any further diagnostic evaluation that may be needed or ordered today. We also reviewed her medications today. she has been encouraged to call the office with any questions or concerns that should arise related to todays visit.    No orders of the defined types were placed in this encounter.   Meds ordered this encounter  Medications   cyanocobalamin (VITAMIN B12) injection 1,000 mcg   Vitamin D, Ergocalciferol, (DRISDOL) 1.25 MG (50000 UNIT) CAPS capsule    Sig: Take 1 capsule (50,000 Units total) by mouth every 7 (seven) days.    Dispense:  12 capsule    Refill:  1   cariprazine (VRAYLAR) 1.5 MG capsule    Sig: Take 1 capsule (1.5 mg total) by mouth daily.    Dispense:  30 capsule    Refill:  3    Add to lithium    Return for CPE, Fitzgerald Dunne PCP earliest  available opening. need B12 inj in 1 month as well. .   Total time spent:30 Minutes Time spent includes review of chart, medications, test results, and follow up plan with the patient.   [place] Controlled Substance Database was reviewed by me.  This patient was seen by Sallyanne Kuster, FNP-C in collaboration with Dr. Beverely Risen as a part of collaborative care agreement.   Claire Bridge R. Tedd Sias, MSN, FNP-C Internal medicine

## 2022-06-14 LAB — LITHIUM LEVEL: Lithium Lvl: 0.9 mmol/L (ref 0.5–1.2)

## 2022-06-18 ENCOUNTER — Ambulatory Visit: Payer: Medicaid Other | Admitting: General Practice

## 2022-06-18 ENCOUNTER — Ambulatory Visit
Admission: RE | Admit: 2022-06-18 | Discharge: 2022-06-18 | Disposition: A | Discharge status: Home / Self Care | Payer: Medicaid Other | Attending: Gastroenterology | Admitting: Gastroenterology

## 2022-06-18 ENCOUNTER — Encounter: Payer: Self-pay | Admitting: Gastroenterology

## 2022-06-18 ENCOUNTER — Encounter
Admission: RE | Disposition: A | Discharge status: Home / Self Care | Payer: Self-pay | Source: Home / Self Care | Attending: Gastroenterology

## 2022-06-18 DIAGNOSIS — R198 Other specified symptoms and signs involving the digestive system and abdomen: Secondary | ICD-10-CM | POA: Insufficient documentation

## 2022-06-18 DIAGNOSIS — G8929 Other chronic pain: Secondary | ICD-10-CM

## 2022-06-18 DIAGNOSIS — K449 Diaphragmatic hernia without obstruction or gangrene: Secondary | ICD-10-CM

## 2022-06-18 DIAGNOSIS — R1013 Epigastric pain: Secondary | ICD-10-CM

## 2022-06-18 HISTORY — DX: Unspecified hearing loss, bilateral: H91.93

## 2022-06-18 HISTORY — PX: ESOPHAGOGASTRODUODENOSCOPY (EGD) WITH PROPOFOL: SHX5813

## 2022-06-18 LAB — POCT PREGNANCY, URINE: Preg Test, Ur: NEGATIVE

## 2022-06-18 SURGERY — ESOPHAGOGASTRODUODENOSCOPY (EGD) WITH PROPOFOL
Anesthesia: General

## 2022-06-18 MED ORDER — PROPOFOL 500 MG/50ML IV EMUL
INTRAVENOUS | Status: DC | PRN
  Administered 2022-06-18: 150 ug/kg/min via INTRAVENOUS

## 2022-06-18 MED ORDER — PROPOFOL 10 MG/ML IV BOLUS
INTRAVENOUS | Status: DC | PRN
  Administered 2022-06-18: 150 mg via INTRAVENOUS

## 2022-06-18 MED ORDER — LIDOCAINE HCL (PF) 2 % IJ SOLN
INTRAMUSCULAR | Status: AC
  Filled 2022-06-18: qty 5

## 2022-06-18 MED ORDER — DEXMEDETOMIDINE HCL IN NACL 80 MCG/20ML IV SOLN
INTRAVENOUS | Status: AC
  Filled 2022-06-18: qty 20

## 2022-06-18 MED ORDER — GLYCOPYRROLATE 0.2 MG/ML IJ SOLN
INTRAMUSCULAR | Status: DC | PRN
  Administered 2022-06-18: .2 mg via INTRAVENOUS

## 2022-06-18 MED ORDER — GLYCOPYRROLATE 0.2 MG/ML IJ SOLN
INTRAMUSCULAR | Status: AC
  Filled 2022-06-18: qty 1

## 2022-06-18 MED ORDER — LIDOCAINE HCL (CARDIAC) PF 100 MG/5ML IV SOSY
PREFILLED_SYRINGE | INTRAVENOUS | Status: DC | PRN
  Administered 2022-06-18: 100 mg via INTRAVENOUS

## 2022-06-18 MED ORDER — SODIUM CHLORIDE 0.9 % IV SOLN: INTRAVENOUS | Status: DC

## 2022-06-18 MED ORDER — DEXMEDETOMIDINE HCL IN NACL 200 MCG/50ML IV SOLN
INTRAVENOUS | Status: DC | PRN
  Administered 2022-06-18: 12 ug via INTRAVENOUS
  Administered 2022-06-18: 8 ug via INTRAVENOUS

## 2022-06-18 MED ORDER — PROPOFOL 1000 MG/100ML IV EMUL
INTRAVENOUS | Status: AC
  Filled 2022-06-18: qty 100

## 2022-06-18 NOTE — Transfer of Care (Signed)
Immediate Anesthesia Transfer of Care Note  Patient: Claire Frederick  Procedure(s) Performed: ESOPHAGOGASTRODUODENOSCOPY (EGD) WITH PROPOFOL  Patient Location: PACU and Endoscopy Unit  Anesthesia Type:General  Level of Consciousness: awake  Airway & Oxygen Therapy: Patient Spontanous Breathing  Post-op Assessment: Report given to RN  Post vital signs: stable  Last Vitals:  Vitals Value Taken Time  BP    Temp    Pulse    Resp    SpO2      Last Pain:  Vitals:   06/18/22 0842  TempSrc: Temporal  PainSc: 4          Complications: No notable events documented.

## 2022-06-18 NOTE — H&P (Signed)
Arlyss Repress, MD [address]  [address]  [place], [place] [address]  Main: [phone]  Fax: [phone] Pager: [phone]  Primary Care Physician:  Sallyanne Kuster, NP Primary Gastroenterologist:  Dr. Arlyss Repress  Pre-Procedure History & Physical: HPI:  Claire Frederick is a 31 y.o. female is here for an endoscopy.   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, hard of hearing   Hernia, hiatal    Herpes simplex virus type 1 (HSV-1) dermatitis    Hypothyroidism    Multiple body piercings    including tongue   Plantar fasciitis, bilateral    Sciatica     Past Surgical History:  Procedure Laterality Date   ESOPHAGOGASTRODUODENOSCOPY (EGD) WITH PROPOFOL N/A 10/19/2020   Procedure: ESOPHAGOGASTRODUODENOSCOPY (EGD) WITH BIOPSY;  Surgeon: Midge Minium, MD;  Location: [facility];  Service: Endoscopy;  Laterality: N/A;  UPREG   FLEXIBLE SIGMOIDOSCOPY N/A 10/30/2021   Procedure: FLEXIBLE SIGMOIDOSCOPY;  Surgeon: Toney Reil, MD;  Location: [facility] ENDOSCOPY;  Service: Gastroenterology;  Laterality: N/A;  Fleet enema 1 hr prior   LAPAROSCOPIC UNILATERAL SALPINGECTOMY Right 11/30/2016   Procedure: LAPAROSCOPIC UNILATERAL SALPINGECTOMY WITH ECTOPIC;  Surgeon: Conard Novak, MD;  Location: [facility] ORS;  Service: Gynecology;  Laterality: Right;    Prior to Admission medications   Medication Sig Start Date End Date Taking? Authorizing Provider  cariprazine (VRAYLAR) 1.5 MG capsule Take 1 capsule (1.5 mg total) by mouth daily. 06/13/22  Yes Abernathy, Arlyss Repress, NP  hydrOXYzine (ATARAX/VISTARIL) 25 MG tablet Take by mouth.   Yes [provider]  lithium 300 MG tablet Take 1 tablet (300 mg total) by mouth 3 (three) times daily.  05/24/22  Yes Abernathy, Arlyss Repress, NP  loratadine (CLARITIN) 10 MG tablet Take 1 tablet (10 mg total) by mouth daily. For allergies 01/19/17  Yes Nwoko, Nicole Kindred I, NP  norethindrone (AYGESTIN) 5 MG tablet Take 1 tablet (5 mg total) by mouth daily. 10/10/21  Yes Conard Novak, MD  pantoprazole (PROTONIX) 40 MG tablet Take 1 tablet (40 mg total) by mouth 2 (two) times daily before a meal. 05/29/22 08/27/22 Yes Tysen Roesler, Loel Dubonnet, MD  sucralfate (CARAFATE) 1 g tablet Take 1 tablet (1 g total) by mouth 4 (four) times daily. 10/02/21 06/18/22 Yes Midge Minium, MD  tiZANidine (ZANAFLEX) 2 MG tablet Take by mouth. 03/25/21  Yes [provider]  Vitamin D, Ergocalciferol, (DRISDOL) 1.25 MG (50000 UNIT) CAPS capsule Take 1 capsule (50,000 Units total) by mouth every 7 (seven) days. 06/13/22  Yes Abernathy, Arlyss Repress, NP  acetaminophen (TYLENOL) 325 MG tablet Take 1 tablet (325 mg total) by mouth every 6 (six) hours as needed for moderate pain. 01/19/17   Armandina Stammer I, NP  acyclovir ointment (ZOVIRAX) 5 % Apply 1 application. topically every 3 (three) hours. 02/20/22   Mirna Mires, CNM  albuterol (VENTOLIN HFA) 108 (90 Base) MCG/ACT inhaler Inhale into the lungs every 6 (six) hours as needed for wheezing or shortness of breath.    [provider]  etonogestrel (NEXPLANON) 68 MG IMPL implant 1 each (68 mg total) by Subdermal route once for 1 dose. 02/16/20 10/17/20  Conard Novak, MD  fluticasone Aleda Grana)  50 MCG/ACT nasal spray 2 sprays into each nostril daily. 12/15/21 12/15/22  [provider]  ondansetron (ZOFRAN) 4 MG tablet Take by mouth.    [provider]    Allergies as of 06/03/2022   (No Known Allergies)    Family History  Problem Relation Age of Onset   Depression Mother    Cancer Mother    Arthritis Mother    Anxiety disorder Mother    Alcohol abuse Mother    ADD / ADHD Mother    Drug abuse Mother    Breast cancer Mother    Asthma Father     Hypercholesterolemia Father    Drug abuse Sister    Alcohol abuse Brother    Obesity Son    Depression Son    Asthma Son    Anxiety disorder Son    ADD / ADHD Son    Cancer Paternal Aunt    Ovarian cancer Paternal Aunt    Diabetes Paternal Uncle    Obesity Maternal Grandmother    Depression Maternal Grandmother    Cancer Maternal Grandmother    Anxiety disorder Maternal Grandmother    Breast cancer Maternal Grandmother    Cancer Maternal Grandfather    Lung cancer Maternal Grandfather    Cancer Paternal Grandmother    Ovarian cancer Paternal Grandmother     Social History   Socioeconomic History   Marital status: Legally Separated    Spouse name: Not on file   Number of children: Not on file   Years of education: Not on file   Highest education level: Not on file  Occupational History   Not on file  Tobacco Use   Smoking status: Former    Packs/day: 0.50    Types: Cigarettes    Quit date: 11/18/2019    Years since quitting: 2.5   Smokeless tobacco: Never  Vaping Use   Vaping Use: Every day  Substance and Sexual Activity   Alcohol use: Yes    Comment: socially   Drug use: Yes    Types: Marijuana   Sexual activity: Yes    Partners: Male    Birth control/protection: Implant  Other Topics Concern   Not on file  Social History Narrative   Not on file   Social Determinants of Health   Financial Resource Strain: Not on file  Food Insecurity: Not on file  Transportation Needs: Not on file  Physical Activity: Not on file  Stress: Not on file  Social Connections: Not on file  Intimate Partner Violence: Not on file    Review of Systems: See HPI, otherwise negative ROS  Physical Exam: BP 118/84   Pulse 85   Temp (!) 96.8 F (36 C) (Temporal)   Resp 16   Ht 5\' 4"  (1.626 m)   Wt 104.3 kg   SpO2 100%   BMI 39.48 kg/m  General:   Alert,  pleasant and cooperative in NAD Head:  Normocephalic and atraumatic. Neck:  Supple; no masses or  thyromegaly. Lungs:  Clear throughout to auscultation.    Heart:  Regular rate and rhythm. Abdomen:  Soft, nontender and nondistended. Normal bowel sounds, without guarding, and without rebound.   Neurologic:  Alert and  oriented x4;  grossly normal neurologically.  Impression/Plan: Claire Frederick is here for an endoscopy to be performed for epigastric pain, globus sensation  Risks, benefits, limitations, and alternatives regarding  endoscopy have been reviewed with the patient.  Questions have been answered.  All parties agreeable.  Lannette Donath, MD  06/18/2022, 8:46 AM

## 2022-06-18 NOTE — Anesthesia Preprocedure Evaluation (Signed)
Anesthesia Evaluation  Patient identified by MRN, date of birth, ID band Patient awake    Reviewed: Allergy & Precautions, NPO status , Patient's Chart, lab work & pertinent test results  History of Anesthesia Complications Negative for: history of anesthetic complications  Airway Mallampati: IV  TM Distance: >3 FB Neck ROM: full    Dental no notable dental hx.    Pulmonary neg shortness of breath, asthma , neg COPD, Patient abstained from smoking., former smoker,    Pulmonary exam normal        Cardiovascular (-) angina(-) CAD, (-) Past MI and (-) CABG negative cardio ROS Normal cardiovascular exam     Neuro/Psych negative neurological ROS  negative psych ROS   GI/Hepatic negative GI ROS, Neg liver ROS,   Endo/Other  Hypothyroidism   Renal/GU negative Renal ROS  negative genitourinary   Musculoskeletal   Abdominal   Peds  Hematology negative hematology ROS (+)   Anesthesia Other Findings Past Medical History: No date: Anxiety No date: Asthma No date: Bipolar disorder (HCC) No date: Carpal tunnel syndrome, bilateral     Comment:  both wrists No date: Depression 11/24/2016: Ectopic pregnancy No date: Endometriosis No date: Fibromyalgia No date: Hearing loss, bilateral     Comment:  due to nerve damage, hard of hearing No date: Hernia, hiatal No date: Herpes simplex virus type 1 (HSV-1) dermatitis No date: Hypothyroidism No date: Multiple body piercings     Comment:  including tongue No date: Plantar fasciitis, bilateral No date: Sciatica  Past Surgical History: 10/19/2020: ESOPHAGOGASTRODUODENOSCOPY (EGD) WITH PROPOFOL; N/A     Comment:  Procedure: ESOPHAGOGASTRODUODENOSCOPY (EGD) WITH BIOPSY;              Surgeon: Midge Minium, MD;  Location: [facility];  Service: Endoscopy;  Laterality: N/A;  UPREG 10/30/2021: FLEXIBLE SIGMOIDOSCOPY; N/A     Comment:  Procedure: FLEXIBLE  SIGMOIDOSCOPY;  Surgeon: Toney Reil, MD;  Location: [facility] ENDOSCOPY;  Service:               Gastroenterology;  Laterality: N/A;  Fleet enema 1 hr               prior 11/30/2016: LAPAROSCOPIC UNILATERAL SALPINGECTOMY; Right     Comment:  Procedure: LAPAROSCOPIC UNILATERAL SALPINGECTOMY WITH               ECTOPIC;  Surgeon: Conard Novak, MD;  Location: [facility]              ORS;  Service: Gynecology;  Laterality: Right;  BMI    Body Mass Index: 39.48 kg/m      Reproductive/Obstetrics negative OB ROS                             Anesthesia Physical Anesthesia Plan  ASA: 2  Anesthesia Plan: General   Post-op Pain Management: Minimal or no pain anticipated   Induction: Intravenous  PONV Risk Score and Plan: 3 and Propofol infusion, TIVA and Ondansetron  Airway Management Planned: Nasal Cannula  Additional Equipment: None  Intra-op Plan:   Post-operative Plan:   Informed Consent: I have reviewed the patients History and Physical, chart, labs and discussed the procedure including the risks, benefits and alternatives for the proposed anesthesia with the patient or authorized representative who  has indicated his/her understanding and acceptance.     Dental advisory given  Plan Discussed with: CRNA and Surgeon  Anesthesia Plan Comments: (Discussed risks of anesthesia with patient, including possibility of difficulty with spontaneous ventilation under anesthesia necessitating airway intervention, PONV, and rare risks such as cardiac or respiratory or neurological events, and allergic reactions. Discussed the role of CRNA in patient's perioperative care. Patient understands.)        Anesthesia Quick Evaluation

## 2022-06-18 NOTE — Op Note (Signed)
[facility] Patient Name: Claire Frederick Procedure Date: 06/18/2022 9:22 AM MRN: [id number] Account #: [id number] Date of Birth: March 04, 1991 Admit Type: Outpatient Age: 31 Room: [facility] ENDO ROOM 3 Gender: Female Note Status: Finalized Instrument Name: Upper Endoscope [id number] Procedure:             Upper GI endoscopy Indications:           Epigastric abdominal pain Providers:             Toney Reil MD, MD Referring MD:          Sallyanne Kuster (Referring MD) Medicines:             General Anesthesia Complications:         No immediate complications. Estimated blood loss: None. Procedure:             Pre-Anesthesia Assessment:                        - Prior to the procedure, a History and Physical was                         performed, and patient medications and allergies were                         reviewed. The patient is competent. The risks and                         benefits of the procedure and the sedation options and                         risks were discussed with the patient. All questions                         were answered and informed consent was obtained.                         Patient identification and proposed procedure were                         verified by the physician, the nurse, the                         anesthesiologist, the anesthetist and the technician                         in the pre-procedure area in the procedure room in the                         endoscopy suite. Mental Status Examination: alert and                         oriented. Airway Examination: normal oropharyngeal                         airway and neck mobility. Respiratory Examination:                         clear to auscultation. CV Examination: normal.  Prophylactic Antibiotics: The patient does not require                         prophylactic antibiotics. Prior Anticoagulants: The                          patient has taken no previous anticoagulant or                         antiplatelet agents. ASA Grade Assessment: II - A                         patient with mild systemic disease. After reviewing                         the risks and benefits, the patient was deemed in                         satisfactory condition to undergo the procedure. The                         anesthesia plan was to use general anesthesia.                         Immediately prior to administration of medications,                         the patient was re-assessed for adequacy to receive                         sedatives. The heart rate, respiratory rate, oxygen                         saturations, blood pressure, adequacy of pulmonary                         ventilation, and response to care were monitored                         throughout the procedure. The physical status of the                         patient was re-assessed after the procedure.                        After obtaining informed consent, the endoscope was                         passed under direct vision. Throughout the procedure,                         the patient's blood pressure, pulse, and oxygen                         saturations were monitored continuously. The Endoscope                         was introduced through the mouth, and advanced to the  second part of duodenum. The upper GI endoscopy was                         accomplished without difficulty. The patient tolerated                         the procedure well. Findings:      The duodenal bulb and second portion of the duodenum were normal.       Biopsies were taken with a cold forceps for histology.      The entire examined stomach was normal. Biopsies were taken with a cold       forceps for histology.      The cardia and gastric fundus were normal on retroflexion.      A 1 cm hiatal hernia was present.      Esophagogastric landmarks were identified:  the gastroesophageal junction       was found at 35 cm from the incisors.      The gastroesophageal junction and examined esophagus were normal. Impression:            - Normal duodenal bulb and second portion of the                         duodenum. Biopsied.                        - Normal stomach. Biopsied.                        - 1 cm hiatal hernia.                        - Esophagogastric landmarks identified.                        - Normal gastroesophageal junction and esophagus. Recommendation:        - Await pathology results.                        - Discharge patient to home (with escort).                        - Resume previous diet today.                        - Follow an antireflux regimen.                        - Continue present medications. Procedure Code(s):     --- Professional ---                        [phone], Esophagogastroduodenoscopy, flexible,                         transoral; with biopsy, single or multiple Diagnosis Code(s):     --- Professional ---                        K44.9, Diaphragmatic hernia without obstruction or  gangrene                        R10.13, Epigastric pain CPT copyright 2019 [employer] Association. All rights reserved. The codes documented in this report are preliminary and upon coder review may  be revised to meet current compliance requirements. Dr. Libby Maw Toney Reil MD, MD 06/18/2022 9:38:57 AM This report has been signed electronically. Number of Addenda: 0 Note Initiated On: 06/18/2022 9:22 AM Estimated Blood Loss:  Estimated blood loss: none.      [facility]

## 2022-06-18 NOTE — Anesthesia Postprocedure Evaluation (Signed)
Anesthesia Post Note  Patient: Claire Frederick  Procedure(s) Performed: ESOPHAGOGASTRODUODENOSCOPY (EGD) WITH PROPOFOL  Patient location during evaluation: Endoscopy Anesthesia Type: General Level of consciousness: awake and alert Pain management: pain level controlled Vital Signs Assessment: post-procedure vital signs reviewed and stable Respiratory status: spontaneous breathing, nonlabored ventilation, respiratory function stable and patient connected to nasal cannula oxygen Cardiovascular status: blood pressure returned to baseline and stable Postop Assessment: no apparent nausea or vomiting Anesthetic complications: no   No notable events documented.   Last Vitals:  Vitals:   06/18/22 0951 06/18/22 1001  BP: 117/77 116/77  Pulse: 97 88  Resp: 20 17  Temp:    SpO2: 97% 98%    Last Pain:  Vitals:   06/18/22 1001  TempSrc:   PainSc: 0-No pain                 Stephanie Coup

## 2022-06-19 ENCOUNTER — Encounter: Payer: Self-pay | Admitting: Gastroenterology

## 2022-06-19 LAB — SURGICAL PATHOLOGY

## 2022-06-19 NOTE — Progress Notes (Signed)
Please let the patient know that her current lithium level is in therapeutic range, so there will be no change in her lithium dose for now.

## 2022-06-20 ENCOUNTER — Telehealth: Payer: Self-pay

## 2022-06-20 NOTE — Telephone Encounter (Signed)
Spoke to pt, provided results  

## 2022-06-20 NOTE — Telephone Encounter (Signed)
-----   Message from Sallyanne Kuster, NP sent at 06/19/2022  5:46 PM EDT ----- Please let the patient know that her current lithium level is in therapeutic range, so there will be no change in her lithium dose for now.

## 2022-07-18 ENCOUNTER — Ambulatory Visit: Payer: Medicaid Other

## 2022-07-22 ENCOUNTER — Encounter: Payer: Self-pay | Admitting: Nurse Practitioner

## 2022-07-23 ENCOUNTER — Ambulatory Visit: Payer: Medicaid Other

## 2022-07-29 ENCOUNTER — Encounter: Payer: Medicaid Other | Admitting: Nurse Practitioner

## 2022-07-29 DIAGNOSIS — H9012 Conductive hearing loss, unilateral, left ear, with unrestricted hearing on the contralateral side: Secondary | ICD-10-CM | POA: Insufficient documentation

## 2022-07-29 DIAGNOSIS — H8093 Unspecified otosclerosis, bilateral: Secondary | ICD-10-CM | POA: Insufficient documentation

## 2022-08-28 ENCOUNTER — Encounter: Payer: Medicaid Other | Admitting: Nurse Practitioner

## 2022-09-25 ENCOUNTER — Other Ambulatory Visit: Payer: Self-pay | Admitting: Nurse Practitioner

## 2022-09-25 DIAGNOSIS — Z79899 Other long term (current) drug therapy: Secondary | ICD-10-CM

## 2022-09-25 DIAGNOSIS — F3181 Bipolar II disorder: Secondary | ICD-10-CM

## 2022-09-25 DIAGNOSIS — F603 Borderline personality disorder: Secondary | ICD-10-CM

## 2022-09-26 ENCOUNTER — Other Ambulatory Visit: Payer: Self-pay

## 2022-10-01 ENCOUNTER — Ambulatory Visit: Payer: Medicaid Other | Admitting: Gastroenterology

## 2022-10-25 ENCOUNTER — Encounter: Payer: Self-pay | Admitting: Nurse Practitioner

## 2022-10-25 ENCOUNTER — Ambulatory Visit (INDEPENDENT_AMBULATORY_CARE_PROVIDER_SITE_OTHER): Payer: Medicaid Other | Admitting: Nurse Practitioner

## 2022-10-25 VITALS — BP 118/73 | HR 84 | Temp 98.2°F | Resp 16 | Ht 64.0 in | Wt 233.0 lb

## 2022-10-25 DIAGNOSIS — B070 Plantar wart: Secondary | ICD-10-CM

## 2022-10-25 DIAGNOSIS — F331 Major depressive disorder, recurrent, moderate: Secondary | ICD-10-CM

## 2022-10-25 DIAGNOSIS — M797 Fibromyalgia: Secondary | ICD-10-CM

## 2022-10-25 DIAGNOSIS — Z760 Encounter for issue of repeat prescription: Secondary | ICD-10-CM

## 2022-10-25 DIAGNOSIS — Z119 Encounter for screening for infectious and parasitic diseases, unspecified: Secondary | ICD-10-CM

## 2022-10-25 DIAGNOSIS — Z0001 Encounter for general adult medical examination with abnormal findings: Secondary | ICD-10-CM | POA: Diagnosis not present

## 2022-10-25 DIAGNOSIS — R300 Dysuria: Secondary | ICD-10-CM | POA: Diagnosis not present

## 2022-10-25 DIAGNOSIS — F410 Panic disorder [episodic paroxysmal anxiety] without agoraphobia: Secondary | ICD-10-CM

## 2022-10-25 DIAGNOSIS — E538 Deficiency of other specified B group vitamins: Secondary | ICD-10-CM

## 2022-10-25 DIAGNOSIS — F3181 Bipolar II disorder: Secondary | ICD-10-CM

## 2022-10-25 MED ORDER — GABAPENTIN 300 MG PO CAPS: ORAL_CAPSULE | ORAL | 2 refills | Status: DC

## 2022-10-25 MED ORDER — SERTRALINE HCL 50 MG PO TABS: 50.0000 mg | ORAL_TABLET | Freq: Every day | ORAL | 3 refills | Status: DC

## 2022-10-25 MED ORDER — CARIPRAZINE HCL 1.5 MG PO CAPS: 1.5000 mg | ORAL_CAPSULE | Freq: Every day | ORAL | 3 refills | Status: DC

## 2022-10-25 MED ORDER — CYANOCOBALAMIN 1000 MCG/ML IJ SOLN
1000.0000 ug | Freq: Once | INTRAMUSCULAR | Status: AC
  Administered 2022-10-25: 1000 ug via INTRAMUSCULAR

## 2022-10-25 NOTE — Progress Notes (Unsigned)
[facility] [address] [place], [place] [address]  Internal MEDICINE  Office Visit Note  Patient Name: Claire Frederick  [id number]  [id number]  Date of Service: 10/25/2022  Chief Complaint  Patient presents with   Annual Exam   Depression    HPI Claire Frederick presents for an annual well visit and physical exam.  Well-appearing 31 y.o. female with PTSD. Depression, anxiety  Routine CRC screening:  Pap smear: Eye exam and/or foot exam: Labs:  New or worsening pain: Other concerns: Pap smear next visit  Weight loss  Low b12 -- inj On weekly vit d Lithium level ok     Current Medication: Outpatient Encounter Medications as of 10/25/2022  Medication Sig Note   acetaminophen (TYLENOL) 325 MG tablet Take 1 tablet (325 mg total) by mouth every 6 (six) hours as needed for moderate pain.    acyclovir ointment (ZOVIRAX) 5 % Apply 1 application. topically every 3 (three) hours.    albuterol (VENTOLIN HFA) 108 (90 Base) MCG/ACT inhaler Inhale into the lungs every 6 (six) hours as needed for wheezing or shortness of breath.    fluticasone (FLONASE) 50 MCG/ACT nasal spray 2 sprays into each nostril daily.    gabapentin (NEURONTIN) 300 MG capsule Take 1 capsule by mouth every morning and 2 capsules by mouth every night at bedtime.    hydrOXYzine (ATARAX/VISTARIL) 25 MG tablet Take by mouth.    lithium 300 MG tablet TAKE 1 TABLET BY MOUTH THREE TIMES DAILY    loratadine (CLARITIN) 10 MG tablet Take 1 tablet (10 mg total) by mouth daily. For allergies    norethindrone (AYGESTIN) 5 MG tablet Take 1 tablet (5 mg total) by mouth daily.    ondansetron (ZOFRAN) 4 MG tablet Take by mouth. 10/19/2020: Last had to use approx 2-3 months ago   sertraline (ZOLOFT) 50 MG tablet Take 1 tablet (50 mg total) by mouth daily. May start with 1/2 tablet daily for the first week.    tiZANidine (ZANAFLEX) 2 MG tablet Take by mouth.    Vitamin D, Ergocalciferol, (DRISDOL) 1.25 MG (50000 UNIT) CAPS  capsule Take 1 capsule (50,000 Units total) by mouth every 7 (seven) days.    [DISCONTINUED] cariprazine (VRAYLAR) 1.5 MG capsule Take 1 capsule (1.5 mg total) by mouth daily.    cariprazine (VRAYLAR) 1.5 MG capsule Take 1 capsule (1.5 mg total) by mouth daily.    etonogestrel (NEXPLANON) 68 MG IMPL implant 1 each (68 mg total) by Subdermal route once for 1 dose.    pantoprazole (PROTONIX) 40 MG tablet Take 1 tablet (40 mg total) by mouth 2 (two) times daily before a meal.    sucralfate (CARAFATE) 1 g tablet Take 1 tablet (1 g total) by mouth 4 (four) times daily.    Facility-Administered Encounter Medications as of 10/25/2022  Medication   cyanocobalamin (VITAMIN B12) injection 1,000 mcg    Surgical History: Past Surgical History:  Procedure Laterality Date   ESOPHAGOGASTRODUODENOSCOPY (EGD) WITH PROPOFOL N/A 10/19/2020   Procedure: ESOPHAGOGASTRODUODENOSCOPY (EGD) WITH BIOPSY;  Surgeon: Midge Minium, MD;  Location: [facility];  Service: Endoscopy;  Laterality: N/A;  UPREG   ESOPHAGOGASTRODUODENOSCOPY (EGD) WITH PROPOFOL N/A 06/18/2022   Procedure: ESOPHAGOGASTRODUODENOSCOPY (EGD) WITH PROPOFOL;  Surgeon: Toney Reil, MD;  Location: [facility] ENDOSCOPY;  Service: Gastroenterology;  Laterality: N/A;   FLEXIBLE SIGMOIDOSCOPY N/A 10/30/2021   Procedure: FLEXIBLE SIGMOIDOSCOPY;  Surgeon: Toney Reil, MD;  Location: [facility] ENDOSCOPY;  Service: Gastroenterology;  Laterality: N/A;  Fleet enema 1  hr prior   LAPAROSCOPIC UNILATERAL SALPINGECTOMY Right 11/30/2016   Procedure: LAPAROSCOPIC UNILATERAL SALPINGECTOMY WITH ECTOPIC;  Surgeon: Conard Novak, MD;  Location: [facility] ORS;  Service: Gynecology;  Laterality: Right;    Medical History: Past Medical History:  Diagnosis Date   Anxiety    Asthma    Bipolar disorder (HCC)    Carpal tunnel syndrome, bilateral    both wrists   Depression    Ectopic pregnancy 11/24/2016   Endometriosis    Fibromyalgia    Hearing loss, 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, hard of hearing   Hernia, hiatal    Herpes simplex virus type 1 (HSV-1) dermatitis    Hypothyroidism    Multiple body piercings    including tongue   Plantar fasciitis, bilateral    Sciatica     Family History: Family History  Problem Relation Age of Onset   Depression Mother    Cancer Mother    Arthritis Mother    Anxiety disorder Mother    Alcohol abuse Mother    ADD / ADHD Mother    Drug abuse Mother    Breast cancer Mother    Asthma Father    Hypercholesterolemia Father    Drug abuse Sister    Alcohol abuse Brother    Obesity Son    Depression Son    Asthma Son    Anxiety disorder Son    ADD / ADHD Son    Cancer Paternal Aunt    Ovarian cancer Paternal Aunt    Diabetes Paternal Uncle    Obesity Maternal Grandmother    Depression Maternal Grandmother    Cancer Maternal Grandmother    Anxiety disorder Maternal Grandmother    Breast cancer Maternal Grandmother    Cancer Maternal Grandfather    Lung cancer Maternal Grandfather    Cancer Paternal Grandmother    Ovarian cancer Paternal Grandmother     Social History   Socioeconomic History   Marital status: Legally Separated    Spouse name: Not on file   Number of children: Not on file   Years of education: Not on file   Highest education level: Not on file  Occupational History   Not on file  Tobacco Use   Smoking status: Former    Packs/day: 0.50    Types: Cigarettes    Quit date: 11/18/2019    Years since quitting: 2.9   Smokeless tobacco: Never  Vaping Use   Vaping Use: Every day  Substance and Sexual Activity   Alcohol use: Yes    Comment: socially   Drug use: Yes    Types: Marijuana   Sexual activity: Yes    Partners: Male    Birth control/protection: Implant  Other Topics Concern   Not on file  Social History Narrative   Not on file   Social Determinants of Health   Financial Resource Strain: Not on file  Food Insecurity: Not on file  Transportation Needs:  Not on file  Physical Activity: Not on file  Stress: Not on file  Social Connections: Not on file  Intimate Partner Violence: Not on file      Review of Systems  Vital Signs: BP 118/73   Pulse 84   Temp 98.2 F (36.8 C)   Resp 16   Ht 5\' 4"  (1.626 m)   Wt 233 lb (105.7 kg)   SpO2 98%   BMI 39.99 kg/m    Physical Exam     Assessment/Plan: 1. Encounter  for routine adult health examination with abnormal findings  2. B12 deficiency - cyanocobalamin (VITAMIN B12) injection 1,000 mcg  3. Plantar wart of right foot - Ambulatory referral to Podiatry  4. Bipolar 2 disorder, major depressive episode (HCC) - cariprazine (VRAYLAR) 1.5 MG capsule; Take 1 capsule (1.5 mg total) by mouth daily.  Dispense: 30 capsule; Refill: 3  5. Moderate episode of recurrent major depressive disorder (HCC) - cariprazine (VRAYLAR) 1.5 MG capsule; Take 1 capsule (1.5 mg total) by mouth daily.  Dispense: 30 capsule; Refill: 3 - sertraline (ZOLOFT) 50 MG tablet; Take 1 tablet (50 mg total) by mouth daily. May start with 1/2 tablet daily for the first week.  Dispense: 30 tablet; Refill: 3  6. Fibromyalgia - gabapentin (NEURONTIN) 300 MG capsule; Take 1 capsule by mouth every morning and 2 capsules by mouth every night at bedtime.  Dispense: 90 capsule; Refill: 2  7. Screening examination for infectious disease - HepB+HepC+HIV Panel     General Counseling: Nonnie verbalizes understanding of the findings of todays visit and agrees with plan of treatment. I have discussed any further diagnostic evaluation that may be needed or ordered today. We also reviewed her medications today. she has been encouraged to call the office with any questions or concerns that should arise related to todays visit.    Orders Placed This Encounter  Procedures   HepB+HepC+HIV Panel   Ambulatory referral to Podiatry    Meds ordered this encounter  Medications   cyanocobalamin (VITAMIN B12) injection 1,000 mcg    cariprazine (VRAYLAR) 1.5 MG capsule    Sig: Take 1 capsule (1.5 mg total) by mouth daily.    Dispense:  30 capsule    Refill:  3    Add to lithium   sertraline (ZOLOFT) 50 MG tablet    Sig: Take 1 tablet (50 mg total) by mouth daily. May start with 1/2 tablet daily for the first week.    Dispense:  30 tablet    Refill:  3   gabapentin (NEURONTIN) 300 MG capsule    Sig: Take 1 capsule by mouth every morning and 2 capsules by mouth every night at bedtime.    Dispense:  90 capsule    Refill:  2    Return in about 1 month (around 11/25/2022) for PAP ONLY, Weight loss, Dayami Taitt PCP and metabolic test also .   Total time spent:*** Minutes Time spent includes review of chart, medications, test results, and follow up plan with the patient.   [place] Controlled Substance Database was reviewed by me.  This patient was seen by Sallyanne Kuster, FNP-C in collaboration with Dr. Beverely Risen as a part of collaborative care agreement.  Jacqueline Spofford R. Tedd Sias, MSN, FNP-C Internal medicine

## 2022-10-26 ENCOUNTER — Encounter: Payer: Self-pay | Admitting: Nurse Practitioner

## 2022-10-26 LAB — UA/M W/RFLX CULTURE, ROUTINE
Bilirubin, UA: NEGATIVE
Glucose, UA: NEGATIVE
Ketones, UA: NEGATIVE
Leukocytes,UA: NEGATIVE
Nitrite, UA: NEGATIVE
Protein,UA: NEGATIVE
RBC, UA: NEGATIVE
Specific Gravity, UA: 1.009 (ref 1.005–1.030)
Urobilinogen, Ur: 0.2 mg/dL (ref 0.2–1.0)
pH, UA: 7.5 (ref 5.0–7.5)

## 2022-10-26 LAB — MICROSCOPIC EXAMINATION
Casts: NONE SEEN /lpf
RBC, Urine: NONE SEEN /hpf (ref 0–2)

## 2022-10-29 ENCOUNTER — Other Ambulatory Visit: Payer: Self-pay | Admitting: Gastroenterology

## 2022-10-29 ENCOUNTER — Other Ambulatory Visit: Payer: Self-pay | Admitting: Nurse Practitioner

## 2022-10-29 DIAGNOSIS — E559 Vitamin D deficiency, unspecified: Secondary | ICD-10-CM

## 2022-10-30 NOTE — Telephone Encounter (Signed)
Please that is ok to send

## 2022-10-30 NOTE — Telephone Encounter (Signed)
Ok

## 2022-10-31 NOTE — Telephone Encounter (Signed)
sent 

## 2022-11-04 LAB — HEPB+HEPC+HIV PANEL
HIV Screen 4th Generation wRfx: NONREACTIVE
Hep B C IgM: NEGATIVE
Hep B Core Total Ab: NEGATIVE
Hep B E Ab: NEGATIVE
Hep B E Ag: NEGATIVE
Hep B Surface Ab, Qual: REACTIVE
Hep C Virus Ab: NONREACTIVE
Hepatitis B Surface Ag: NEGATIVE

## 2022-11-20 ENCOUNTER — Telehealth: Payer: Self-pay

## 2022-11-20 NOTE — Progress Notes (Signed)
Please let patient know her tests were negative for hepatitis B and C and negative for HIV. Please also transfer patient to [facility] for scheduling, she needs a pap smear and routine follow up for monitoring her lithium level.

## 2022-11-20 NOTE — Telephone Encounter (Signed)
Left message for patient to give office a call back.  

## 2022-11-20 NOTE — Telephone Encounter (Signed)
Sent mychart message to pt. 

## 2022-11-20 NOTE — Telephone Encounter (Signed)
-----   Message from Jonetta Osgood, NP sent at 11/20/2022  9:01 AM EST ----- Please let patient know her tests were negative for hepatitis B and C and negative for HIV. Please also transfer patient to [facility] for scheduling, she needs a pap smear and routine follow up for monitoring her lithium level.

## 2022-11-22 ENCOUNTER — Ambulatory Visit: Payer: Medicaid Other | Admitting: Nurse Practitioner

## 2022-12-10 ENCOUNTER — Encounter: Payer: Self-pay | Admitting: Nurse Practitioner

## 2022-12-10 ENCOUNTER — Ambulatory Visit (INDEPENDENT_AMBULATORY_CARE_PROVIDER_SITE_OTHER): Payer: Medicaid Other | Admitting: Nurse Practitioner

## 2022-12-10 VITALS — BP 120/88 | HR 82 | Temp 98.1°F | Resp 16 | Ht 64.0 in | Wt 228.0 lb

## 2022-12-10 DIAGNOSIS — Z113 Encounter for screening for infections with a predominantly sexual mode of transmission: Secondary | ICD-10-CM | POA: Diagnosis not present

## 2022-12-10 DIAGNOSIS — R042 Hemoptysis: Secondary | ICD-10-CM

## 2022-12-10 DIAGNOSIS — Z124 Encounter for screening for malignant neoplasm of cervix: Secondary | ICD-10-CM | POA: Diagnosis not present

## 2022-12-10 DIAGNOSIS — Z6839 Body mass index (BMI) 39.0-39.9, adult: Secondary | ICD-10-CM | POA: Diagnosis not present

## 2022-12-10 DIAGNOSIS — F331 Major depressive disorder, recurrent, moderate: Secondary | ICD-10-CM

## 2022-12-10 MED ORDER — SERTRALINE HCL 100 MG PO TABS: 100.0000 mg | ORAL_TABLET | Freq: Every day | ORAL | 3 refills | Status: DC

## 2022-12-10 NOTE — Progress Notes (Signed)
[facility] [address] [place], [place] [address]  Internal MEDICINE  Office Visit Note  Patient Name: Claire Frederick  [id number]  [id number]  Date of Service: 12/10/2022  Chief Complaint  Patient presents with   Follow-up   Depression    HPI Claire Frederick presents for a follow-up visit for routine pap smear, weight loss management, depression and coughing up blood.  Metabolic test -- weight loss zone is 1740-2174 calories per day. Metabolic rate is increased.  Pap due, done today in office  Coughing up blood -- pink tinged sputum, every day, w/hx of asthma.  Depression -- mother died 3 weeks ago, grieving, not sure if medication is working.    Current Medication: Outpatient Encounter Medications as of 12/10/2022  Medication Sig Note   acetaminophen (TYLENOL) 325 MG tablet Take 1 tablet (325 mg total) by mouth every 6 (six) hours as needed for moderate pain.    acyclovir ointment (ZOVIRAX) 5 % Apply 1 application. topically every 3 (three) hours.    albuterol (VENTOLIN HFA) 108 (90 Base) MCG/ACT inhaler Inhale into the lungs every 6 (six) hours as needed for wheezing or shortness of breath.    cariprazine (VRAYLAR) 1.5 MG capsule Take 1 capsule (1.5 mg total) by mouth daily.    fluticasone (FLONASE) 50 MCG/ACT nasal spray 2 sprays into each nostril daily.    gabapentin (NEURONTIN) 300 MG capsule Take 1 capsule by mouth every morning and 2 capsules by mouth every night at bedtime.    hydrOXYzine (ATARAX/VISTARIL) 25 MG tablet Take by mouth.    lithium 300 MG tablet TAKE 1 TABLET BY MOUTH THREE TIMES DAILY    loratadine (CLARITIN) 10 MG tablet Take 1 tablet (10 mg total) by mouth daily. For allergies    norethindrone (AYGESTIN) 5 MG tablet Take 1 tablet (5 mg total) by mouth daily.    ondansetron (ZOFRAN) 4 MG tablet Take by mouth. 10/19/2020: Last had to use approx 2-3 months ago   sertraline (ZOLOFT) 100 MG tablet Take 1 tablet (100 mg total) by mouth daily.     tiZANidine (ZANAFLEX) 2 MG tablet Take by mouth.    Vitamin D, Ergocalciferol, (DRISDOL) 1.25 MG (50000 UNIT) CAPS capsule TAKE 1 CAPSULE BY MOUTH EVERY SEVEN (7) DAYS    [DISCONTINUED] sertraline (ZOLOFT) 50 MG tablet Take 1 tablet (50 mg total) by mouth daily. May start with 1/2 tablet daily for the first week.    etonogestrel (NEXPLANON) 68 MG IMPL implant 1 each (68 mg total) by Subdermal route once for 1 dose.    pantoprazole (PROTONIX) 40 MG tablet Take 1 tablet (40 mg total) by mouth 2 (two) times daily before a meal.    sucralfate (CARAFATE) 1 g tablet Take 1 tablet (1 g total) by mouth 4 (four) times daily.    No facility-administered encounter medications on file as of 12/10/2022.    Surgical History: Past Surgical History:  Procedure Laterality Date   ESOPHAGOGASTRODUODENOSCOPY (EGD) WITH PROPOFOL N/A 10/19/2020   Procedure: ESOPHAGOGASTRODUODENOSCOPY (EGD) WITH BIOPSY;  Surgeon: Midge Minium, MD;  Location: [facility];  Service: Endoscopy;  Laterality: N/A;  UPREG   ESOPHAGOGASTRODUODENOSCOPY (EGD) WITH PROPOFOL N/A 06/18/2022   Procedure: ESOPHAGOGASTRODUODENOSCOPY (EGD) WITH PROPOFOL;  Surgeon: Toney Reil, MD;  Location: [facility] ENDOSCOPY;  Service: Gastroenterology;  Laterality: N/A;   FLEXIBLE SIGMOIDOSCOPY N/A 10/30/2021   Procedure: FLEXIBLE SIGMOIDOSCOPY;  Surgeon: Toney Reil, MD;  Location: [facility] ENDOSCOPY;  Service: Gastroenterology;  Laterality: N/A;  Fleet enema 1 hr  prior   LAPAROSCOPIC UNILATERAL SALPINGECTOMY Right 11/30/2016   Procedure: LAPAROSCOPIC UNILATERAL SALPINGECTOMY WITH ECTOPIC;  Surgeon: Will Bonnet, MD;  Location: [facility];  Service: Gynecology;  Laterality: Right;    Medical History: Past Medical History:  Diagnosis Date   Anxiety    Asthma    Bipolar disorder ([place])    Carpal tunnel syndrome, bilateral    both wrists   Depression    Ectopic pregnancy 11/24/2016   Endometriosis    Fibromyalgia    Hearing loss, 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, hard of hearing   Hernia, hiatal    Herpes simplex virus type 1 (HSV-1) dermatitis    Hypothyroidism    Multiple body piercings    including tongue   Plantar fasciitis, bilateral    Sciatica     Family History: Family History  Problem Relation Age of Onset   Depression Mother    Cancer Mother    Arthritis Mother    Anxiety disorder Mother    Alcohol abuse Mother    ADD / ADHD Mother    Drug abuse Mother    Breast cancer Mother    Asthma Father    Hypercholesterolemia Father    Drug abuse Sister    Alcohol abuse Brother    Obesity Son    Depression Son    Asthma Son    Anxiety disorder Son    ADD / ADHD Son    Cancer Paternal Aunt    Ovarian cancer Paternal Aunt    Diabetes Paternal Uncle    Obesity Maternal Grandmother    Depression Maternal Grandmother    Cancer Maternal Grandmother    Anxiety disorder Maternal Grandmother    Breast cancer Maternal Grandmother    Cancer Maternal Grandfather    Lung cancer Maternal Grandfather    Cancer Paternal Grandmother    Ovarian cancer Paternal Grandmother     Social History   Socioeconomic History   Marital status: Legally Separated    Spouse name: Not on file   Number of children: Not on file   Years of education: Not on file   Highest education level: Not on file  Occupational History   Not on file  Tobacco Use   Smoking status: Former    Packs/day: 0.50    Types: Cigarettes    Quit date: 11/18/2019    Years since quitting: 3.0   Smokeless tobacco: Never  Vaping Use   Vaping Use: Every day  Substance and Sexual Activity   Alcohol use: Yes    Comment: socially   Drug use: Yes    Types: Marijuana   Sexual activity: Yes    Partners: Male    Birth control/protection: Implant  Other Topics Concern   Not on file  Social History Narrative   Not on file   Social Determinants of Health   Financial Resource Strain: Not on file  Food Insecurity: Not on file  Transportation Needs:  Not on file  Physical Activity: Not on file  Stress: Not on file  Social Connections: Not on file  Intimate Partner Violence: Not on file      Review of Systems  Constitutional:  Positive for appetite change and fatigue. Negative for chills, fever and unexpected weight change.  HENT:  Positive for trouble swallowing. Negative for postnasal drip, rhinorrhea, sinus pressure, sinus pain, sneezing and tinnitus.        Hair loss  Eyes: Negative.   Respiratory:  Positive for cough (pink  tinged sputum), shortness of breath and wheezing. Negative for chest tightness.   Cardiovascular: Negative.  Negative for chest pain and palpitations.  Gastrointestinal:  Positive for abdominal distention, abdominal pain and nausea.       Has a hiatal hernia  Endocrine: Negative.   Genitourinary: Negative.  Negative for dysuria.  Musculoskeletal:  Positive for arthralgias, back pain and myalgias.  Skin: Negative.   Neurological:  Negative for dizziness, weakness, numbness and headaches.  Hematological: Negative.   Psychiatric/Behavioral:  Positive for behavioral problems, decreased concentration, dysphoric mood and sleep disturbance. Negative for agitation, confusion, hallucinations, self-injury and suicidal ideas. The patient is nervous/anxious. The patient is not hyperactive.     Vital Signs: BP 120/88   Pulse 82   Temp 98.1 F (36.7 C)   Resp 16   Ht 5\' 4"  (1.626 m)   Wt 228 lb (103.4 kg)   SpO2 96%   BMI 39.14 kg/m    Physical Exam Vitals reviewed.  Constitutional:      General: She is not in acute distress.    Appearance: Normal appearance. She is obese. She is not ill-appearing.  HENT:     Head: Normocephalic and atraumatic.  Eyes:     Pupils: Pupils are equal, round, and reactive to light.  Cardiovascular:     Rate and Rhythm: Normal rate and regular rhythm.  Pulmonary:     Effort: Pulmonary effort is normal. No respiratory distress.  Abdominal:     General: There is distension.      Palpations: There is no mass.     Hernia: There is no hernia in the left inguinal area or right inguinal area.  Genitourinary:    General: Normal vulva.     Exam position: Lithotomy position.     Pubic Area: No rash or pubic lice.      Labia:        Right: No rash, tenderness, lesion or injury.        Left: No rash, tenderness, lesion or injury.      Urethra: No prolapse, urethral pain, urethral swelling or urethral lesion.     Vagina: No signs of injury and foreign body. No vaginal discharge, erythema, tenderness, bleeding, lesions or prolapsed vaginal walls.     Cervix: Discharge present. No cervical motion tenderness, friability, lesion, erythema, cervical bleeding or eversion.     Uterus: Normal. Not deviated, not enlarged, not fixed, not tender and no uterine prolapse.      Adnexa: Right adnexa normal and left adnexa normal.       Right: No mass, tenderness or fullness.         Left: No mass, tenderness or fullness.       Rectum: Normal. No mass, tenderness, anal fissure or external hemorrhoid.  Lymphadenopathy:     Lower Body: No right inguinal adenopathy. No left inguinal adenopathy.  Neurological:     Mental Status: She is alert and oriented to person, place, and time.  Psychiatric:        Attention and Perception: Attention normal.        Mood and Affect: Mood is anxious and depressed.        Speech: Speech normal.        Behavior: Behavior normal. Behavior is cooperative.        Thought Content: Thought content normal. Thought content is not paranoid. Thought content does not include homicidal or suicidal ideation.        Assessment/Plan: 1. Hemoptysis Pink tinged sputum.  CT chest ordered for further evaluation.  - CT Chest Wo Contrast; Future  2. BMI 39.0-39.9,adult Metabolic test done in office, patient provided with handouts about weight loss program, sample meal plans and discussed target daily caloric intake that should help patient lose weight. Will follow  up in 8 weeks after working on diet and lifestyle modifications alone.  Obesity Counseling: Risk Assessment: An assessment of behavioral risk factors was made today and includes lack of exercise sedentary lifestyle, lack of portion control and poor dietary habits. The patient has been screened for diabetes and a metabolic test to determine basal metabolic rate has been performed.   Risk Modification Advice: The patient was counseled on portion control guidelines. Safe recommendations on caloric restriction discussed with patient. Sample meals plans were provided. General guidelines on diet and lifestyle modifications were discussed at length. Introducing physical activity as tolerated and at the patient's ability level is recommended.  - Metabolic Test  3. Routine cervical smear Routine pelvic exam, normal except for white cervical discharge. Pap smear done.  - IGP, Aptima HPV  4. Screen for sexually transmitted diseases Cervical swab done in office - NuSwab Vaginitis Plus (VG+)  5. Moderate episode of recurrent major depressive disorder (HCC) Sertraline dose increased to help decrease depression symptoms.  - sertraline (ZOLOFT) 100 MG tablet; Take 1 tablet (100 mg total) by mouth daily.  Dispense: 30 tablet; Refill: 3   General Counseling: Claire Frederick verbalizes understanding of the findings of todays visit and agrees with plan of treatment. I have discussed any further diagnostic evaluation that may be needed or ordered today. We also reviewed her medications today. she has been encouraged to call the office with any questions or concerns that should arise related to todays visit.    Orders Placed This Encounter  Procedures   Metabolic Test   CT Chest Wo Contrast   NuSwab Vaginitis Plus (VG+)    Meds ordered this encounter  Medications   sertraline (ZOLOFT) 100 MG tablet    Sig: Take 1 tablet (100 mg total) by mouth daily.    Dispense:  30 tablet    Refill:  3    Return in about 8  weeks (around 02/04/2023) for F/U, Weight loss, Suyash Amory PCP.   Total time spent:30 Minutes Time spent includes review of chart, medications, test results, and follow up plan with the patient.    Controlled Substance Database was reviewed by me.  This patient was seen by Claire Kuster, FNP-C in collaboration with Dr. Beverely Risen as a part of collaborative care agreement.   Claire Winters R. Tedd Sias, MSN, FNP-C Internal medicine

## 2022-12-11 ENCOUNTER — Encounter: Payer: Self-pay | Admitting: Nurse Practitioner

## 2022-12-11 ENCOUNTER — Telehealth: Payer: Self-pay | Admitting: Nurse Practitioner

## 2022-12-11 NOTE — Telephone Encounter (Signed)
Notified patient of CT appointment dated, time & location. She stated she is coughing up more blood. Per provider, patient advised to go to ED-[facility]

## 2022-12-13 LAB — IGP, APTIMA HPV: HPV Aptima: NEGATIVE

## 2022-12-13 LAB — NUSWAB VAGINITIS PLUS (VG+)
Atopobium vaginae: HIGH Score — AB
BVAB 2: HIGH Score — AB
Candida albicans, NAA: NEGATIVE
Candida glabrata, NAA: NEGATIVE
Chlamydia trachomatis, NAA: NEGATIVE
Megasphaera 1: HIGH Score — AB
Neisseria gonorrhoeae, NAA: NEGATIVE
Trich vag by NAA: NEGATIVE

## 2022-12-14 ENCOUNTER — Encounter: Payer: Self-pay | Admitting: Nurse Practitioner

## 2022-12-16 ENCOUNTER — Telehealth: Payer: Self-pay

## 2022-12-16 ENCOUNTER — Other Ambulatory Visit: Payer: Self-pay | Admitting: Nurse Practitioner

## 2022-12-16 MED ORDER — METRONIDAZOLE 500 MG PO TABS: 500.0000 mg | ORAL_TABLET | Freq: Two times a day (BID) | ORAL | 0 refills | Status: DC

## 2022-12-16 NOTE — Progress Notes (Signed)
Please call the patient and let her know that her pap was negative for intraepithelial lesion and/or malignancy and HPV testing was negative as well. Her Nuswab was negative for sexually transmitted infections, and yeast. Pap smear should be repeated in 5 years.   Her nuswab is positive for bacterial vaginosis. I have sent metronidazole 500 mg twice daily for 7 days. Do not drink alcohol while taking this medication.

## 2022-12-16 NOTE — Telephone Encounter (Signed)
-----  Message from Jonetta Osgood, NP sent at 12/16/2022  8:15 AM EST ----- Please call the patient and let her know that her pap was negative for intraepithelial lesion and/or malignancy and HPV testing was negative as well. Her Nuswab was negative for sexually transmitted infections, and yeast. Pap smear should be repeated in 5 years.   Her nuswab is positive for bacterial vaginosis. I have sent metronidazole 500 mg twice daily for 7 days. Do not drink alcohol while taking this medication.

## 2022-12-16 NOTE — Telephone Encounter (Signed)
Pt advised  for pap  is negative and Nuswab result showed she had Bacterial vaginosis we send med don't drink alcohol with med and also STD test is negative

## 2022-12-16 NOTE — Telephone Encounter (Signed)
Left message for patient to give office a call.  

## 2022-12-17 ENCOUNTER — Ambulatory Visit: Payer: Medicaid Other | Admitting: Nurse Practitioner

## 2022-12-17 ENCOUNTER — Encounter: Payer: Self-pay | Admitting: Nurse Practitioner

## 2022-12-17 VITALS — BP 135/87 | HR 100 | Temp 98.0°F | Resp 16 | Ht 64.0 in | Wt 229.4 lb

## 2022-12-17 DIAGNOSIS — F3181 Bipolar II disorder: Secondary | ICD-10-CM

## 2022-12-17 DIAGNOSIS — F331 Major depressive disorder, recurrent, moderate: Secondary | ICD-10-CM

## 2022-12-17 DIAGNOSIS — F09 Unspecified mental disorder due to known physiological condition: Secondary | ICD-10-CM

## 2022-12-17 DIAGNOSIS — F410 Panic disorder [episodic paroxysmal anxiety] without agoraphobia: Secondary | ICD-10-CM | POA: Diagnosis not present

## 2022-12-17 DIAGNOSIS — Z91410 Personal history of adult physical and sexual abuse: Secondary | ICD-10-CM

## 2022-12-17 DIAGNOSIS — R042 Hemoptysis: Secondary | ICD-10-CM
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
## 2022-12-23 ENCOUNTER — Other Ambulatory Visit: Payer: Self-pay | Admitting: Nurse Practitioner

## 2022-12-23 DIAGNOSIS — F603 Borderline personality disorder: Secondary | ICD-10-CM

## 2022-12-23 DIAGNOSIS — F3181 Bipolar II disorder: Secondary | ICD-10-CM

## 2022-12-23 DIAGNOSIS — Z79899 Other long term (current) drug therapy: Secondary | ICD-10-CM

## 2022-12-23 NOTE — Telephone Encounter (Signed)
Please review and send 

## 2022-12-27 ENCOUNTER — Ambulatory Visit
Admission: RE | Admit: 2022-12-27 | Discharge: 2022-12-27 | Disposition: A | Discharge status: Home / Self Care | Payer: Medicaid Other | Source: Ambulatory Visit | Attending: Nurse Practitioner | Admitting: Nurse Practitioner

## 2022-12-27 DIAGNOSIS — R042 Hemoptysis: Secondary | ICD-10-CM

## 2023-01-01 ENCOUNTER — Telehealth: Payer: Self-pay | Admitting: Nurse Practitioner

## 2023-01-01 NOTE — Telephone Encounter (Signed)
[place] referral faxed [place] in [place]: [phone]

## 2023-01-07 NOTE — Progress Notes (Signed)
Will discuss CT scan results at upcoming office visit

## 2023-01-10 ENCOUNTER — Other Ambulatory Visit: Payer: Self-pay | Admitting: Gastroenterology

## 2023-01-29 ENCOUNTER — Telehealth: Payer: Self-pay

## 2023-01-29 NOTE — Telephone Encounter (Signed)
Patient called and left a voicemail stating she need a refill on Sucralfate and Pantoprazole. Patient is also wanting to make a follow up appointment. Called patient and patient made appointment for tomorrow at 3:30 and she will wait on medications till she is seen tomorrow.

## 2023-01-30 ENCOUNTER — Ambulatory Visit (INDEPENDENT_AMBULATORY_CARE_PROVIDER_SITE_OTHER): Payer: Medicaid Other | Admitting: Gastroenterology

## 2023-01-30 ENCOUNTER — Other Ambulatory Visit: Payer: Self-pay

## 2023-01-30 ENCOUNTER — Encounter: Payer: Self-pay | Admitting: Gastroenterology

## 2023-01-30 VITALS — BP 119/80 | HR 105 | Temp 98.4°F | Ht 64.0 in | Wt 230.5 lb

## 2023-01-30 DIAGNOSIS — K219 Gastro-esophageal reflux disease without esophagitis: Secondary | ICD-10-CM | POA: Diagnosis not present

## 2023-01-30 DIAGNOSIS — R140 Abdominal distension (gaseous): Secondary | ICD-10-CM | POA: Diagnosis not present

## 2023-01-30 MED ORDER — OMEPRAZOLE 40 MG PO CPDR: 40.0000 mg | DELAYED_RELEASE_CAPSULE | Freq: Two times a day (BID) | ORAL | 2 refills | Status: DC

## 2023-01-30 NOTE — Progress Notes (Signed)
Cephas Darby, MD [address]  [place]  [place],  [address]  Main: [phone]  Fax: [phone]    Gastroenterology Consultation  Referring Provider:     Jonetta Osgood, NP Primary Care Physician:  Jonetta Osgood, NP Primary Gastroenterologist:  Dr. Cephas Darby Reason for Consultation: Abdominal bloating, heartburn        HPI:   Claire Frederick is a 32 y.o. female referred by Jonetta Osgood, NP  for consultation & management of chronic symptoms of abdominal bloating and heartburn.  Patient has history of chronic GERD for which she has been taking Protonix 40 mg 1-2 times daily along as well as the sucralfate.  Patient reports that for last 2 weeks, she has been experiencing significant upper abdominal bloating, she denies any constipation.  She also reports severe heartburn, burning in her throat with regurgitation of food and yellowish material that wakes her up from sleep.  She does acknowledge drinking sweet tea, eats bacon every day and snacks junk foods daily.  NSAIDs: None  Antiplts/Anticoagulants/Anti thrombotics: None  GI Procedures:  Upper endoscopy 06/18/2022 Normal duodenal bulb and duodenum Normal stomach 1 cm hiatal hernia Normal GE junction and esophagus DIAGNOSIS:  A. DUODENUM; COLD BIOPSY:  - ENTERIC MUCOSA WITH PRESERVED VILLOUS ARCHITECTURE AND NO SIGNIFICANT  HISTOPATHOLOGIC CHANGE.  - NEGATIVE FOR FEATURES OF CELIAC, DYSPLASIA, AND MALIGNANCY.   B. STOMACH, RANDOM; COLD BIOPSY:  - GASTRIC ANTRAL AND OXYNTIC MUCOSA WITH NO SIGNIFICANT HISTOPATHOLOGIC  CHANGE.  - NEGATIVE FOR H. PYLORI, DYSPLASIA, AND MALIGNANCY.   DIAGNOSIS:  A. SMALL BOWEL; BIOPSY:  - NEGATIVE FOR VILLOUS ATROPHY, INCREASED INTRAEPITHELIAL LYMPHOCYTES  AND ORGANISMS.   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, hard of hearing   Hernia, hiatal    Herpes simplex virus type 1 (HSV-1) dermatitis    Hypothyroidism    Multiple body piercings    including tongue   Plantar fasciitis, bilateral    Sciatica     Past Surgical History:  Procedure Laterality Date   ESOPHAGOGASTRODUODENOSCOPY (EGD) WITH PROPOFOL N/A 10/19/2020   Procedure: ESOPHAGOGASTRODUODENOSCOPY (EGD) WITH BIOPSY;  Surgeon: Lucilla Lame, MD;  Location: [facility];  Service: Endoscopy;  Laterality: N/A;  UPREG   ESOPHAGOGASTRODUODENOSCOPY (EGD) WITH PROPOFOL N/A 06/18/2022   Procedure: ESOPHAGOGASTRODUODENOSCOPY (EGD) WITH PROPOFOL;  Surgeon: Lin Landsman, MD;  Location: [facility] ENDOSCOPY;  Service: Gastroenterology;  Laterality: N/A;   FLEXIBLE SIGMOIDOSCOPY N/A 10/30/2021   Procedure: FLEXIBLE SIGMOIDOSCOPY;  Surgeon: Lin Landsman, MD;  Location: [facility] ENDOSCOPY;  Service: Gastroenterology;  Laterality: N/A;  Fleet enema 1 hr prior   LAPAROSCOPIC UNILATERAL SALPINGECTOMY Right 11/30/2016   Procedure: LAPAROSCOPIC UNILATERAL SALPINGECTOMY WITH ECTOPIC;  Surgeon: Will Bonnet, MD;  Location: [facility] ORS;  Service: Gynecology;  Laterality: Right;     Current Outpatient Medications:    acetaminophen (TYLENOL) 325 MG tablet, Take 1 tablet (325 mg total) by mouth every 6 (six) hours as needed for moderate pain., Disp: , Rfl:    acyclovir ointment (ZOVIRAX) 5 %, Apply 1 application. topically every 3 (three) hours., Disp: 15 g, Rfl: 0   albuterol (VENTOLIN HFA) 108 (90 Base) MCG/ACT inhaler, Inhale into the lungs  every 6 (six) hours as needed for wheezing or shortness of breath., Disp: , Rfl:    cariprazine (VRAYLAR) 1.5 MG capsule, Take 1 capsule (1.5 mg total) by mouth daily., Disp: 30 capsule, Rfl: 3   etonogestrel (NEXPLANON) 68 MG IMPL implant, 1 each (68 mg total) by Subdermal route once for 1 dose., Disp: 1 each, Rfl: 0    fluticasone (FLONASE) 50 MCG/ACT nasal spray, Place 2 sprays into both nostrils daily., Disp: , Rfl:    gabapentin (NEURONTIN) 300 MG capsule, Take 1 capsule by mouth every morning and 2 capsules by mouth every night at bedtime., Disp: 90 capsule, Rfl: 2   hydrOXYzine (ATARAX/VISTARIL) 25 MG tablet, Take by mouth., Disp: , Rfl:    lithium 300 MG tablet, TAKE 1 TABLET BY MOUTH THREE TIMES DAILY, Disp: 90 tablet, Rfl: 0   omeprazole (PRILOSEC) 40 MG capsule, Take 1 capsule (40 mg total) by mouth 2 (two) times daily before a meal., Disp: 60 capsule, Rfl: 2   ondansetron (ZOFRAN) 4 MG tablet, Take by mouth., Disp: , Rfl:    sertraline (ZOLOFT) 100 MG tablet, Take 1 tablet (100 mg total) by mouth daily., Disp: 30 tablet, Rfl: 3   tiZANidine (ZANAFLEX) 2 MG tablet, Take by mouth., Disp: , Rfl:    Vitamin D, Ergocalciferol, (DRISDOL) 1.25 MG (50000 UNIT) CAPS capsule, TAKE 1 CAPSULE BY MOUTH EVERY SEVEN (7) DAYS, Disp: 12 capsule, Rfl: 0   Family History  Problem Relation Age of Onset   Depression Mother    Cancer Mother    Arthritis Mother    Anxiety disorder Mother    Alcohol abuse Mother    ADD / ADHD Mother    Drug abuse Mother    Breast cancer Mother    Asthma Father    Hypercholesterolemia Father    Drug abuse Sister    Alcohol abuse Brother    Obesity Son    Depression Son    Asthma Son    Anxiety disorder Son    ADD / ADHD Son    Cancer Paternal Aunt    Ovarian cancer Paternal Aunt    Diabetes Paternal Uncle    Obesity Maternal Grandmother    Depression Maternal Grandmother    Cancer Maternal Grandmother    Anxiety disorder Maternal Grandmother    Breast cancer Maternal Grandmother    Cancer Maternal Grandfather    Lung cancer Maternal Grandfather    Cancer Paternal Grandmother    Ovarian cancer Paternal Grandmother      Social History   Tobacco Use   Smoking status: Former    Packs/day: .5    Types: Cigarettes    Quit date: 11/18/2019    Years since quitting: 3.2    Smokeless tobacco: Never  Vaping Use   Vaping Use: Every day  Substance Use Topics   Alcohol use: Not Currently    Comment: socially   Drug use: Yes    Types: Marijuana    Allergies as of 01/30/2023   (No Known Allergies)    Review of Systems:    All systems reviewed and negative except where noted in HPI.   Physical Exam:  BP 119/80 (BP Location: Left Arm, Patient Position: Sitting, Cuff Size: Normal)   Pulse (!) 105   Temp 98.4 F (36.9 C) (Oral)   Ht 5\' 4"  (1.626 m)   Wt 230 lb 8 oz (104.6 kg)   BMI 39.57 kg/m  No LMP recorded. Patient has had an implant.  General:  Alert,  Well-developed, well-nourished, pleasant and cooperative in NAD Head:  Normocephalic and atraumatic. Eyes:  Sclera clear, no icterus.   Conjunctiva pink. Ears:  Normal auditory acuity. Nose:  No deformity, discharge, or lesions. Mouth:  No deformity or lesions,oropharynx pink & moist. Neck:  Supple; no masses or thyromegaly. Lungs:  Respirations even and unlabored.  Clear throughout to auscultation.   No wheezes, crackles, or rhonchi. No acute distress. Heart:  Regular rate and rhythm; no murmurs, clicks, rubs, or gallops. Abdomen:  Normal bowel sounds. Soft, non-tender and severely distended, tympanic to percussion without masses, hepatosplenomegaly or hernias noted.  No guarding or rebound tenderness.   Rectal: Not performed Msk:  Symmetrical without gross deformities. Good, equal movement & strength bilaterally. Pulses:  Normal pulses noted. Extremities:  No clubbing or edema.  No cyanosis. Neurologic:  Alert and oriented x3;  grossly normal neurologically. Skin:  Intact without significant lesions or rashes. No jaundice. Psych:  Alert and cooperative. Normal mood and affect.  Imaging Studies: Reviewed  Assessment and Plan:   Claire Frederick is a 31 y.o. Caucasian female with obesity, BMI 39.5 is seen in consultation for worsening symptoms of abdominal bloating and reflux.  Right  upper quadrant ultrasound in 10/2020 was normal.  HIDA scan in 10/2020 revealed EF of 41%.  Abdominal bloating and reflux: Recent exacerbation EGD twice within last 2 years was unremarkable, no evidence of H. pylori or celiac disease Significantly related to her unhealthy eating habits Strongly advised patient to eliminate sweet tea, carbonated beverages, artificial sweeteners She drinks coffee in the morning with Splenda and creamer Advised patient to significantly cut back on consumption of red meat, processed foods and snacking junk foods Check food allergy profile and alpha gal panel Switch to omeprazole 40 mg twice daily before meals FD guard samples provided for abdominal bloating Advised patient to follow strict dietary changes as advised above for at least 4 weeks.  If workup is negative and symptoms are persistent despite lifestyle modification, will refer to general surgery to evaluate for cholecystectomy for ?  Biliary dyskinesia  Follow up as needed   Cephas Darby, MD

## 2023-01-30 NOTE — Patient Instructions (Signed)
Gave [employer] samples and if they help then you can buy this over the counter.

## 2023-01-31 ENCOUNTER — Encounter: Payer: Self-pay | Admitting: Gastroenterology

## 2023-02-04 ENCOUNTER — Ambulatory Visit: Payer: Medicaid Other | Admitting: Nurse Practitioner

## 2023-02-04 ENCOUNTER — Telehealth: Payer: Self-pay | Admitting: Nurse Practitioner

## 2023-02-04 ENCOUNTER — Encounter: Payer: Self-pay | Admitting: Nurse Practitioner

## 2023-02-04 VITALS — BP 123/73 | Temp 97.7°F | Resp 16 | Ht 64.0 in | Wt 231.6 lb

## 2023-02-04 DIAGNOSIS — Z79899 Other long term (current) drug therapy: Secondary | ICD-10-CM | POA: Diagnosis not present

## 2023-02-04 DIAGNOSIS — Z23 Encounter for immunization: Secondary | ICD-10-CM

## 2023-02-04 DIAGNOSIS — G4709 Other insomnia: Secondary | ICD-10-CM

## 2023-02-04 DIAGNOSIS — F331 Major depressive disorder, recurrent, moderate: Secondary | ICD-10-CM | POA: Diagnosis not present

## 2023-02-04 DIAGNOSIS — R140 Abdominal distension (gaseous): Secondary | ICD-10-CM | POA: Diagnosis not present

## 2023-02-04 LAB — FOOD ALLERGY PROFILE
Allergen Corn, IgE: <0.1 kU/L
Clam IgE: 0.17 kU/L — AB
Codfish IgE: <0.1 kU/L
Egg White IgE: <0.1 kU/L
Milk IgE: <0.1 kU/L
Peanut IgE: <0.1 kU/L
Scallop IgE: <0.1 kU/L
Sesame Seed IgE: <0.1 kU/L
Shrimp IgE: 0.55 kU/L — AB
Soybean IgE: <0.1 kU/L
Walnut IgE: <0.1 kU/L
Wheat IgE: <0.1 kU/L

## 2023-02-04 LAB — ALPHA-GAL PANEL
Allergen Lamb IgE: <0.1 kU/L
Beef IgE: <0.1 kU/L
IgE (Immunoglobulin E), Serum: 306 IU/mL (ref 6–495)
O215-IgE Alpha-Gal: <0.1 kU/L
Pork IgE: <0.1 kU/L

## 2023-02-04 MED ORDER — ESZOPICLONE 3 MG PO TABS: 3.0000 mg | ORAL_TABLET | Freq: Every day | ORAL | 2 refills | Status: DC

## 2023-02-04 MED ORDER — CARIPRAZINE HCL 3 MG PO CAPS: 3.0000 mg | ORAL_CAPSULE | Freq: Every day | ORAL | 3 refills | Status: DC

## 2023-02-04 MED ORDER — TETANUS-DIPHTH-ACELL PERTUSSIS 5-2.5-18.5 LF-MCG/0.5 IM SUSP: 0.5000 mL | Freq: Once | INTRAMUSCULAR | 0 refills | Status: AC

## 2023-02-04 NOTE — Telephone Encounter (Signed)
While in office, patient stated she needs local [place]. I explained to her, no provider in area who accepts her insurance. She will get with her case worker for list of possible local providers and let me know-Toni

## 2023-02-04 NOTE — Progress Notes (Signed)
[facility] [place], [place] [address]  Internal MEDICINE  Office Visit Note  Patient Name: Claire Frederick  [id number]  [id number]  Date of Service: 02/04/2023  Chief Complaint  Patient presents with   Follow-up    Review CT and weight loss     HPI Claire Frederick presents for a follow-up visit for hemoptysis, abdominal bloating, family history of celiac and cancer  hemoptysis is resolved. CT chest was normal.  Seeing GI, tested for food allergies, sensitive to shrimp and clams.  Family history of celiac in mother Family history of breast and ovarian cancer. Trouble sleeping, wants to know what she can take to help with sleep   Current Medication: Outpatient Encounter Medications as of 02/04/2023  Medication Sig Note   acetaminophen (TYLENOL) 325 MG tablet Take 1 tablet (325 mg total) by mouth every 6 (six) hours as needed for moderate pain.    acyclovir ointment (ZOVIRAX) 5 % Apply 1 application. topically every 3 (three) hours.    albuterol (VENTOLIN HFA) 108 (90 Base) MCG/ACT inhaler Inhale into the lungs every 6 (six) hours as needed for wheezing or shortness of breath.    cariprazine (VRAYLAR) 3 MG capsule Take 1 capsule (3 mg total) by mouth daily.    Eszopiclone 3 MG TABS Take 1 tablet (3 mg total) by mouth at bedtime. Take immediately before bedtime    fluticasone (FLONASE) 50 MCG/ACT nasal spray Place 2 sprays into both nostrils daily.    gabapentin (NEURONTIN) 300 MG capsule Take 1 capsule by mouth every morning and 2 capsules by mouth every night at bedtime.    hydrOXYzine (ATARAX/VISTARIL) 25 MG tablet Take by mouth.    lithium 300 MG tablet TAKE 1 TABLET BY MOUTH THREE TIMES DAILY    omeprazole (PRILOSEC) 40 MG capsule Take 1 capsule (40 mg total) by mouth 2 (two) times daily before a meal.    ondansetron (ZOFRAN) 4 MG tablet Take by mouth. 10/19/2020: Last had to use approx 2-3 months ago   sertraline (ZOLOFT) 100 MG tablet Take 1 tablet (100 mg  total) by mouth daily.    tiZANidine (ZANAFLEX) 2 MG tablet Take by mouth.    Vitamin D, Ergocalciferol, (DRISDOL) 1.25 MG (50000 UNIT) CAPS capsule TAKE 1 CAPSULE BY MOUTH EVERY SEVEN (7) DAYS    [DISCONTINUED] cariprazine (VRAYLAR) 1.5 MG capsule Take 1 capsule (1.5 mg total) by mouth daily.    [DISCONTINUED] Tdap (BOOSTRIX) 5-2.5-18.5 LF-MCG/0.5 injection Inject 0.5 mLs into the muscle once.    etonogestrel (NEXPLANON) 68 MG IMPL implant 1 each (68 mg total) by Subdermal route once for 1 dose.    Tdap (BOOSTRIX) 5-2.5-18.5 LF-MCG/0.5 injection Inject 0.5 mLs into the muscle once for 1 dose.    No facility-administered encounter medications on file as of 02/04/2023.    Surgical History: Past Surgical History:  Procedure Laterality Date   ESOPHAGOGASTRODUODENOSCOPY (EGD) WITH PROPOFOL N/A 10/19/2020   Procedure: ESOPHAGOGASTRODUODENOSCOPY (EGD) WITH BIOPSY;  Surgeon: Lucilla Lame, MD;  Location: [place];  Service: Endoscopy;  Laterality: N/A;  UPREG   ESOPHAGOGASTRODUODENOSCOPY (EGD) WITH PROPOFOL N/A 06/18/2022   Procedure: ESOPHAGOGASTRODUODENOSCOPY (EGD) WITH PROPOFOL;  Surgeon: Lin Landsman, MD;  Location: [facility] ENDOSCOPY;  Service: Gastroenterology;  Laterality: N/A;   FLEXIBLE SIGMOIDOSCOPY N/A 10/30/2021   Procedure: FLEXIBLE SIGMOIDOSCOPY;  Surgeon: Lin Landsman, MD;  Location: [facility] ENDOSCOPY;  Service: Gastroenterology;  Laterality: N/A;  Fleet enema 1 hr prior   LAPAROSCOPIC UNILATERAL SALPINGECTOMY Right 11/30/2016   Procedure:  LAPAROSCOPIC UNILATERAL SALPINGECTOMY WITH ECTOPIC;  Surgeon: Will Bonnet, MD;  Location: [facility] OR[facility];  Service: Gynecology;  Laterality: Right;    Medical History: Past Medical History:  Diagnosis Date   Anxiety    Asthma    Bipolar disorder ([place])    Carpal tunnel syndrome, bilateral    both wrists   Depression    Ectopic pregnancy 11/24/2016   Endometriosis    Fibromyalgia    Hearing loss, bilateral    due to nerve  damage, hard of hearing   Hernia, hiatal    Herpes simplex virus type 1 (HSV-1) dermatitis    Hypothyroidism    Multiple body piercings    including tongue   Plantar fasciitis, bilateral    Sciatica     Family History: Family History  Problem Relation Age of Onset   Depression Mother    Cancer Mother    Arthritis Mother    Anxiety disorder Mother    Alcohol abuse Mother    ADD / ADHD Mother    Drug abuse Mother    Breast cancer Mother    Asthma Father    Hypercholesterolemia Father    Drug abuse Sister    Alcohol abuse Brother    Obesity Son    Depression Son    Asthma Son    Anxiety disorder Son    ADD / ADHD Son    Cancer Paternal Aunt    Ovarian cancer Paternal Aunt    Diabetes Paternal Uncle    Obesity Maternal Grandmother    Depression Maternal Grandmother    Cancer Maternal Grandmother    Anxiety disorder Maternal Grandmother    Breast cancer Maternal Grandmother    Cancer Maternal Grandfather    Lung cancer Maternal Grandfather    Cancer Paternal Grandmother    Ovarian cancer Paternal Grandmother     Social History   Socioeconomic History   Marital status: Legally Separated    Spouse name: Not on file   Number of children: Not on file   Years of education: Not on file   Highest education level: Not on file  Occupational History   Not on file  Tobacco Use   Smoking status: Former    Packs/day: .5    Types: Cigarettes    Quit date: 11/18/2019    Years since quitting: 3.2   Smokeless tobacco: Never  Vaping Use   Vaping Use: Every day  Substance and Sexual Activity   Alcohol use: Not Currently    Comment: socially   Drug use: Yes    Types: Marijuana   Sexual activity: Yes    Partners: Male    Birth control/protection: Implant  Other Topics Concern   Not on file  Social History Narrative   Not on file   Social Determinants of Health   Financial Resource Strain: Not on file  Food Insecurity: Not on file  Transportation Needs: Not on  file  Physical Activity: Not on file  Stress: Not on file  Social Connections: Not on file  Intimate Partner Violence: Not on file      Review of Systems  Constitutional:  Positive for appetite change and fatigue. Negative for chills, fever and unexpected weight change.  HENT:  Positive for trouble swallowing. Negative for postnasal drip, rhinorrhea, sinus pressure, sinus pain, sneezing and tinnitus.        Hair loss  Eyes: Negative.   Respiratory:  Negative for cough, chest tightness, shortness of breath and wheezing.   Cardiovascular: Negative.  Negative for chest pain and palpitations.  Gastrointestinal:  Positive for abdominal distention, abdominal pain, nausea and vomiting.       Has a hiatal hernia  Endocrine: Negative.   Genitourinary: Negative.  Negative for dysuria.  Musculoskeletal:  Positive for arthralgias, back pain and myalgias.  Skin: Negative.   Neurological:  Negative for dizziness, weakness, numbness and headaches.  Hematological: Negative.   Psychiatric/Behavioral:  Positive for behavioral problems, decreased concentration, dysphoric mood and sleep disturbance. Negative for agitation, confusion, hallucinations, self-injury and suicidal ideas. The patient is nervous/anxious. The patient is not hyperactive.     Vital Signs: BP 123/73   Temp 97.7 F (36.5 C)   Resp 16   Ht 5\' 4"  (1.626 m)   Wt 231 lb 9.6 oz (105.1 kg)   SpO2 98%   BMI 39.75 kg/m    Physical Exam Vitals reviewed.  Constitutional:      General: She is not in acute distress.    Appearance: Normal appearance. She is obese. She is not ill-appearing.  HENT:     Head: Normocephalic and atraumatic.  Eyes:     Pupils: Pupils are equal, round, and reactive to light.  Cardiovascular:     Rate and Rhythm: Normal rate and regular rhythm.  Pulmonary:     Effort: Pulmonary effort is normal. No respiratory distress.  Abdominal:     General: There is distension.     Palpations: There is no mass.   Neurological:     Mental Status: She is alert and oriented to person, place, and time.  Psychiatric:        Attention and Perception: Attention normal.        Mood and Affect: Mood normal.        Speech: Speech normal.        Behavior: Behavior normal. Behavior is cooperative.        Thought Content: Thought content normal. Thought content is not paranoid. Thought content does not include homicidal or suicidal ideation.        Assessment/Plan: 1. Abdominal bloating Lab ordered for celiac disease.  - Celiac Disease Panel  2. Other insomnia Will try lunesta, follow up in 4 weeks  - Eszopiclone 3 MG TABS; Take 1 tablet (3 mg total) by mouth at bedtime. Take immediately before bedtime  Dispense: 30 tablet; Refill: 2  3. Moderate episode of recurrent major depressive disorder ([place]) Adjunct treatment for depression and bipolar, dose increased to 3 mg daily. Follow up in 4 weeks.  - cariprazine (VRAYLAR) 3 MG capsule; Take 1 capsule (3 mg total) by mouth daily.  Dispense: 30 capsule; Refill: 3  4. Lithium use Repeat lithium level - Lithium level  5. Need for vaccination - Tdap ([place]) 5-2.5-18.5 LF-MCG/0.5 injection; Inject 0.5 mLs into the muscle once for 1 dose.  Dispense: 0.5 mL; Refill: 0   General Counseling: Safina verbalizes understanding of the findings of todays visit and agrees with plan of treatment. I have discussed any further diagnostic evaluation that may be needed or ordered today. We also reviewed her medications today. she has been encouraged to call the office with any questions or concerns that should arise related to todays visit.    Orders Placed This Encounter  Procedures   Celiac Disease Panel   Lithium level    Meds ordered this encounter  Medications   Tdap (BOOSTRIX) 5-2.5-18.5 LF-MCG/0.5 injection    Sig: Inject 0.5 mLs into the muscle once for 1 dose.    Dispense:  0.5 mL    Refill:  0   cariprazine (VRAYLAR) 3 MG capsule    Sig: Take 1  capsule (3 mg total) by mouth daily.    Dispense:  30 capsule    Refill:  3    Discontinue 1.5 mg dose, fill new script today.   Eszopiclone 3 MG TABS    Sig: Take 1 tablet (3 mg total) by mouth at bedtime. Take immediately before bedtime    Dispense:  30 tablet    Refill:  2    New med, fill today    Return in about 4 weeks (around 03/04/2023) for need toni to check into therapist referral. , F/U, Sirr Kabel PCP dose adjustments.   Total time spent:30 Minutes Time spent includes review of chart, medications, test results, and follow up plan with the patient.   [place] Controlled Substance Database was reviewed by me.  This patient was seen by Jonetta Osgood, FNP-C in collaboration with Dr. Clayborn Bigness as a part of collaborative care agreement.   Leanore Biggers R. Valetta Fuller, MSN, FNP-C Internal medicine

## 2023-02-06 ENCOUNTER — Other Ambulatory Visit: Payer: Self-pay | Admitting: Nurse Practitioner

## 2023-02-08 LAB — CELIAC DISEASE PANEL
Endomysial IgA: NEGATIVE
IgA/Immunoglobulin A, Serum: 151 mg/dL (ref 87–352)
Transglutaminase IgA: <2 U/mL (ref 0–3)

## 2023-02-08 LAB — LITHIUM LEVEL: Lithium Lvl: 0.8 mmol/L (ref 0.5–1.2)

## 2023-02-10 ENCOUNTER — Telehealth: Payer: Self-pay

## 2023-02-10 NOTE — Progress Notes (Signed)
Lithium level is good and celiac panel is negative

## 2023-02-10 NOTE — Telephone Encounter (Signed)
Left message for patient to give office a call back.  

## 2023-02-10 NOTE — Telephone Encounter (Signed)
Sent patient Mychart message with lab results.

## 2023-02-19 ENCOUNTER — Encounter: Payer: Self-pay | Admitting: Obstetrics

## 2023-02-19 ENCOUNTER — Ambulatory Visit (INDEPENDENT_AMBULATORY_CARE_PROVIDER_SITE_OTHER): Payer: Medicaid Other | Admitting: Obstetrics

## 2023-02-19 VITALS — BP 121/74 | HR 90 | Ht 61.0 in | Wt 226.6 lb

## 2023-02-19 DIAGNOSIS — Z3046 Encounter for surveillance of implantable subdermal contraceptive: Secondary | ICD-10-CM | POA: Diagnosis not present

## 2023-02-19 MED ORDER — ETONOGESTREL 68 MG ~~LOC~~ IMPL
68.0000 mg | DRUG_IMPLANT | Freq: Once | SUBCUTANEOUS | Status: AC
  Administered 2023-02-19: 68 mg via SUBCUTANEOUS

## 2023-02-19 NOTE — Progress Notes (Signed)
      GYNECOLOGY OFFICE PROCEDURE NOTE  Claire Frederick is a 32 y.o. [phone] here for Nexplanon removal and  Nexplanon insertion.  Last pap smear was on 12/10/22 and was normal.  No other gynecologic concerns.she is happy using Nexplanon BP 121/74   Pulse 90   Ht 5\' 1"  (1.549 m)   Wt 226 lb 9.6 oz (102.8 kg)   LMP  (Within Weeks) Comment: patient reports 3 week ago  BMI 42.82 kg/m  Review of Systems  Constitutional: Negative.   HENT: Negative.    Eyes: Negative.   Respiratory: Negative.    Cardiovascular: Negative.   Gastrointestinal: Negative.   Genitourinary: Negative.   Musculoskeletal: Negative.   Skin: Negative.   All other systems reviewed and are negative. Physical Exam Vitals reviewed.  Constitutional:      Appearance: Normal appearance. She is obese.  Cardiovascular:     Rate and Rhythm: Normal rate and regular rhythm.     Pulses: Normal pulses.     Heart sounds: Normal heart sounds.  Pulmonary:     Effort: Pulmonary effort is normal.     Breath sounds: Normal breath sounds.  Abdominal:     Palpations: Abdomen is soft.  Genitourinary:    Comments: deferred Neurological:     General: No focal deficit present.     Mental Status: She is alert and oriented to person, place, and time.      Nexplanon Removal and Reinsertion Patient identified, informed consent performed, consent signed.   Patient does understand that irregular bleeding is a very common side effect of this medication. She was advised to have backup contraception for one week after replacement of the implant. Pregnancy test in clinic today was negative.  Appropriate time out taken. Nexplanon site identified in left arm.  Area prepped in usual sterile fashon. One ml of 1% lidocaine was used to anesthetize the area at the distal end of the implant. A small stab incision was made right beside the implant on the distal portion. The Nexplanon rod was grasped using hemostats and removed without difficulty.  There was minimal blood loss. There were no complications. Area was then injected with 3 ml of 1 % lidocaine. She was re-prepped with betadine, Nexplanon removed from packaging, Device confirmed in needle, then inserted full length of needle and withdrawn per handbook instructions. Nexplanon was able to palpated in the patient's arm; patient palpated the insert herself.  There was minimal blood loss. Patient insertion site covered with gauze and a pressure bandage to reduce any bruising. The patient tolerated the procedure well and was given post procedure instructions.  She was advised to have backup contraception for one week.     [place]

## 2023-02-19 NOTE — Patient Instructions (Signed)
Nexplanon Instructions After Insertion  Keep bandage clean and dry for 24 hours  May use ice/Tylenol/Ibuprofen for soreness or pain  If you develop fever, drainage or increased warmth from incision site-contact office immediately   

## 2023-02-20 ENCOUNTER — Ambulatory Visit (INDEPENDENT_AMBULATORY_CARE_PROVIDER_SITE_OTHER): Payer: Medicaid Other | Admitting: Nurse Practitioner

## 2023-02-20 ENCOUNTER — Encounter: Payer: Self-pay | Admitting: Obstetrics

## 2023-02-20 ENCOUNTER — Encounter: Payer: Self-pay | Admitting: Nurse Practitioner

## 2023-02-20 VITALS — BP 136/84 | HR 92 | Temp 97.8°F | Resp 16 | Ht 61.0 in | Wt 227.6 lb

## 2023-02-20 DIAGNOSIS — F3181 Bipolar II disorder: Secondary | ICD-10-CM

## 2023-02-20 DIAGNOSIS — R635 Abnormal weight gain: Secondary | ICD-10-CM

## 2023-02-20 DIAGNOSIS — F331 Major depressive disorder, recurrent, moderate: Secondary | ICD-10-CM

## 2023-02-20 DIAGNOSIS — F410 Panic disorder [episodic paroxysmal anxiety] without agoraphobia: Secondary | ICD-10-CM

## 2023-02-20 DIAGNOSIS — G4709 Other insomnia: Secondary | ICD-10-CM | POA: Diagnosis not present

## 2023-02-20 DIAGNOSIS — Z79899 Other long term (current) drug therapy: Secondary | ICD-10-CM | POA: Diagnosis not present

## 2023-02-20 DIAGNOSIS — R140 Abdominal distension (gaseous): Secondary | ICD-10-CM | POA: Diagnosis not present

## 2023-02-20 MED ORDER — PHENTERMINE HCL 37.5 MG PO TABS
37.5000 mg | ORAL_TABLET | Freq: Every day | ORAL | 2 refills | Status: DC
Start: 2023-02-20 — End: 2023-08-14

## 2023-02-20 MED ORDER — ESZOPICLONE 3 MG PO TABS
3.0000 mg | ORAL_TABLET | Freq: Every day | ORAL | 2 refills | Status: DC
Start: 2023-02-20 — End: 2023-08-14

## 2023-02-20 MED ORDER — CARIPRAZINE HCL 1.5 MG PO CAPS: 1.5000 mg | ORAL_CAPSULE | Freq: Every day | ORAL | 5 refills | Status: DC

## 2023-02-20 NOTE — Progress Notes (Signed)
[facility] [address] [place], [place] [address]  Internal MEDICINE  Office Visit Note  Patient Name: Claire Frederick  [id number]  [id number]  Date of Service: 02/20/2023  Chief Complaint  Patient presents with   Depression   Follow-up    HPI Claire Frederick presents for a follow-up visit for anxiety, labile moods, depression, abdominal bloating.   Increased vraylar dose -- noticed that she was feeling better but developed a significant tremor that was affecting her sleep,  requesting to decrease the dose back to 1.5 mg.  In a new relationship and the person has a positive impact on her depression Bloating and GERD is improving some, sees GI.  Started lunesta for insomnia at her previous office visit and it is helping her to sleep better, she wants to continue this medication.  Lithium monitoring -- lithium is within therapeutic range with current dose.     Current Medication: Outpatient Encounter Medications as of 02/20/2023  Medication Sig Note   acetaminophen (TYLENOL) 325 MG tablet Take 1 tablet (325 mg total) by mouth every 6 (six) hours as needed for moderate pain.    acyclovir ointment (ZOVIRAX) 5 % Apply 1 application. topically every 3 (three) hours.    albuterol (VENTOLIN HFA) 108 (90 Base) MCG/ACT inhaler Inhale into the lungs every 6 (six) hours as needed for wheezing or shortness of breath.    cariprazine (VRAYLAR) 1.5 MG capsule Take 1 capsule (1.5 mg total) by mouth daily.    fluticasone (FLONASE) 50 MCG/ACT nasal spray Place 2 sprays into both nostrils daily.    gabapentin (NEURONTIN) 300 MG capsule Take 1 capsule by mouth every morning and 2 capsules by mouth every night at bedtime.    hydrOXYzine (ATARAX/VISTARIL) 25 MG tablet Take by mouth.    lithium 300 MG tablet TAKE 1 TABLET BY MOUTH THREE TIMES DAILY    omeprazole (PRILOSEC) 40 MG capsule Take 1 capsule (40 mg total) by mouth 2 (two) times daily before a meal.    ondansetron (ZOFRAN) 4 MG tablet Take  by mouth. 10/19/2020: Last had to use approx 2-3 months ago   phentermine (ADIPEX-P) 37.5 MG tablet Take 1 tablet (37.5 mg total) by mouth daily before breakfast.    sertraline (ZOLOFT) 100 MG tablet Take 1 tablet (100 mg total) by mouth daily.    tiZANidine (ZANAFLEX) 2 MG tablet Take by mouth.    Vitamin D, Ergocalciferol, (DRISDOL) 1.25 MG (50000 UNIT) CAPS capsule TAKE 1 CAPSULE BY MOUTH EVERY SEVEN (7) DAYS    [DISCONTINUED] cariprazine (VRAYLAR) 3 MG capsule Take 1 capsule (3 mg total) by mouth daily.    [DISCONTINUED] Eszopiclone 3 MG TABS Take 1 tablet (3 mg total) by mouth at bedtime. Take immediately before bedtime    Eszopiclone 3 MG TABS Take 1 tablet (3 mg total) by mouth at bedtime. Take immediately before bedtime    etonogestrel (NEXPLANON) 68 MG IMPL implant 1 each (68 mg total) by Subdermal route once for 1 dose.    Facility-Administered Encounter Medications as of 02/20/2023  Medication   etonogestrel (NEXPLANON) implant 68 mg    Surgical History: Past Surgical History:  Procedure Laterality Date   ESOPHAGOGASTRODUODENOSCOPY (EGD) WITH PROPOFOL N/A 10/19/2020   Procedure: ESOPHAGOGASTRODUODENOSCOPY (EGD) WITH BIOPSY;  Surgeon: Midge Minium, MD;  Location: [facility];  Service: Endoscopy;  Laterality: N/A;  UPREG   ESOPHAGOGASTRODUODENOSCOPY (EGD) WITH PROPOFOL N/A 06/18/2022   Procedure: ESOPHAGOGASTRODUODENOSCOPY (EGD) WITH PROPOFOL;  Surgeon: Toney Reil, MD;  Location: [facility]  ENDOSCOPY;  Service: Gastroenterology;  Laterality: N/A;   FLEXIBLE SIGMOIDOSCOPY N/A 10/30/2021   Procedure: FLEXIBLE SIGMOIDOSCOPY;  Surgeon: Toney Reil, MD;  Location: [facility] ENDOSCOPY;  Service: Gastroenterology;  Laterality: N/A;  Fleet enema 1 hr prior   LAPAROSCOPIC UNILATERAL SALPINGECTOMY Right 11/30/2016   Procedure: LAPAROSCOPIC UNILATERAL SALPINGECTOMY WITH ECTOPIC;  Surgeon: Conard Novak, MD;  Location: [facility] ORS;  Service: Gynecology;  Laterality: Right;     Medical History: Past Medical History:  Diagnosis Date   Anxiety    Asthma    Bipolar disorder    Carpal tunnel syndrome, bilateral    both wrists   Depression    Ectopic pregnancy 11/24/2016   Endometriosis    Fibromyalgia    Hearing loss, 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, hard of hearing   Hernia, hiatal    Herpes simplex virus type 1 (HSV-1) dermatitis    Hypothyroidism    Multiple body piercings    including tongue   Plantar fasciitis, bilateral    Sciatica     Family History: Family History  Problem Relation Age of Onset   Depression Mother    Cancer Mother    Arthritis Mother    Anxiety disorder Mother    Alcohol abuse Mother    ADD / ADHD Mother    Drug abuse Mother    Breast cancer Mother    Asthma Father    Hypercholesterolemia Father    Drug abuse Sister    Alcohol abuse Brother    Obesity Son    Depression Son    Asthma Son    Anxiety disorder Son    ADD / ADHD Son    Cancer Paternal Aunt    Ovarian cancer Paternal Aunt    Diabetes Paternal Uncle    Obesity Maternal Grandmother    Depression Maternal Grandmother    Cancer Maternal Grandmother    Anxiety disorder Maternal Grandmother    Breast cancer Maternal Grandmother    Cancer Maternal Grandfather    Lung cancer Maternal Grandfather    Cancer Paternal Grandmother    Ovarian cancer Paternal Grandmother     Social History   Socioeconomic History   Marital status: Legally Separated    Spouse name: Not on file   Number of children: Not on file   Years of education: Not on file   Highest education level: Not on file  Occupational History   Not on file  Tobacco Use   Smoking status: Former    Packs/day: .5    Types: Cigarettes    Quit date: 11/18/2019    Years since quitting: 3.2   Smokeless tobacco: Never  Vaping Use   Vaping Use: Every day  Substance and Sexual Activity   Alcohol use: Not Currently    Comment: socially   Drug use: Yes    Types: Marijuana   Sexual  activity: Yes    Partners: Male    Birth control/protection: Implant  Other Topics Concern   Not on file  Social History Narrative   Not on file   Social Determinants of Health   Financial Resource Strain: Not on file  Food Insecurity: Not on file  Transportation Needs: Not on file  Physical Activity: Not on file  Stress: Not on file  Social Connections: Not on file  Intimate Partner Violence: Not on file      Review of Systems  Constitutional:  Positive for appetite change and fatigue. Negative for chills, fever and unexpected weight change.  HENT:  Positive for trouble swallowing. Negative for postnasal drip, rhinorrhea, sinus pressure, sinus pain, sneezing and tinnitus.        Hair loss  Eyes: Negative.   Respiratory:  Negative for cough, chest tightness, shortness of breath and wheezing.   Cardiovascular: Negative.  Negative for chest pain and palpitations.  Gastrointestinal:  Positive for abdominal distention (improving), abdominal pain (improving), nausea and vomiting.       Has a hiatal hernia  Endocrine: Negative.   Genitourinary: Negative.  Negative for dysuria.  Musculoskeletal:  Positive for arthralgias, back pain and myalgias.  Skin: Negative.   Neurological:  Negative for dizziness, weakness, numbness and headaches.  Hematological: Negative.   Psychiatric/Behavioral:  Positive for behavioral problems, decreased concentration, dysphoric mood and sleep disturbance. Negative for agitation, confusion, hallucinations, self-injury and suicidal ideas. The patient is nervous/anxious. The patient is not hyperactive.     Vital Signs: BP 136/84   Pulse 92   Temp 97.8 F (36.6 C)   Resp 16   Ht 5\' 1"  (1.549 m)   Wt 227 lb 9.6 oz (103.2 kg)   SpO2 98%   BMI 43.00 kg/m    Physical Exam Vitals reviewed.  Constitutional:      General: She is not in acute distress.    Appearance: Normal appearance. She is obese. She is not ill-appearing.  HENT:     Head:  Normocephalic and atraumatic.  Eyes:     Pupils: Pupils are equal, round, and reactive to light.  Cardiovascular:     Rate and Rhythm: Normal rate and regular rhythm.  Pulmonary:     Effort: Pulmonary effort is normal. No respiratory distress.  Abdominal:     General: There is distension.     Palpations: There is no mass.  Neurological:     Mental Status: She is alert and oriented to person, place, and time.  Psychiatric:        Attention and Perception: Attention normal.        Mood and Affect: Mood normal.        Speech: Speech normal.        Behavior: Behavior normal. Behavior is cooperative.        Thought Content: Thought content normal. Thought content is not paranoid. Thought content does not include homicidal or suicidal ideation.        Assessment/Plan: 1. Other insomnia Continue lunesta as prescribed, follow up in 3 months for additional refills . - Eszopiclone 3 MG TABS; Take 1 tablet (3 mg total) by mouth at bedtime. Take immediately before bedtime  Dispense: 30 tablet; Refill: 2  2. Abdominal bloating Improving, followed by GI  3. Lithium use Continue current dose of lithium, will repeat lithium level in 3 months.   4. Severe anxiety with panic Vraylar dose decreased to 1.5 mg .  - cariprazine (VRAYLAR) 1.5 MG capsule; Take 1 capsule (1.5 mg total) by mouth daily.  Dispense: 30 capsule; Refill: 5  5. Moderate episode of recurrent major depressive disorder Vraylar dose decreased to 1.5 mg and new relationship is having a positive effect on her depression - cariprazine (VRAYLAR) 1.5 MG capsule; Take 1 capsule (1.5 mg total) by mouth daily.  Dispense: 30 capsule; Refill: 5  6. Bipolar 2 disorder, major depressive episode Vraylar dose decreased, continue lithium as prescribed.  - cariprazine (VRAYLAR) 1.5 MG capsule; Take 1 capsule (1.5 mg total) by mouth daily.  Dispense: 30 capsule; Refill: 5  7. Abnormal weight gain Start phentermine as prescribed,  follow  up in 4 weeks.  - phentermine (ADIPEX-P) 37.5 MG tablet; Take 1 tablet (37.5 mg total) by mouth daily before breakfast.  Dispense: 30 tablet; Refill: 2   General Counseling: Tyjae verbalizes understanding of the findings of todays visit and agrees with plan of treatment. I have discussed any further diagnostic evaluation that may be needed or ordered today. We also reviewed her medications today. she has been encouraged to call the office with any questions or concerns that should arise related to todays visit.    No orders of the defined types were placed in this encounter.   Meds ordered this encounter  Medications   cariprazine (VRAYLAR) 1.5 MG capsule    Sig: Take 1 capsule (1.5 mg total) by mouth daily.    Dispense:  30 capsule    Refill:  5    Note decreased dose. Discontinue 3 mg dose, and fill script for 1.5 mg dose now.   phentermine (ADIPEX-P) 37.5 MG tablet    Sig: Take 1 tablet (37.5 mg total) by mouth daily before breakfast.    Dispense:  30 tablet    Refill:  2    Try to run through insurance, if not covered, patient has goodrx coupon.   Eszopiclone 3 MG TABS    Sig: Take 1 tablet (3 mg total) by mouth at bedtime. Take immediately before bedtime    Dispense:  30 tablet    Refill:  2    Do not run through insurance, patient has good rx coupon    Return in about 4 weeks (around 03/20/2023) for F/U med dose adjustment , Dondrea Clendenin PCP.   Total time spent:30 Minutes Time spent includes review of chart, medications, test results, and follow up plan with the patient.   [place] Controlled Substance Database was reviewed by me.  This patient was seen by Sallyanne Kuster, FNP-C in collaboration with Dr. Beverely Risen as a part of collaborative care agreement.   Tarena Gockley R. Tedd Sias, MSN, FNP-C Internal medicine

## 2023-03-08 ENCOUNTER — Encounter: Payer: Self-pay | Admitting: Nurse Practitioner

## 2023-03-20 ENCOUNTER — Ambulatory Visit: Payer: Medicaid Other | Admitting: Nurse Practitioner

## 2023-04-03 ENCOUNTER — Other Ambulatory Visit: Payer: Self-pay | Admitting: Nurse Practitioner

## 2023-04-03 DIAGNOSIS — Z79899 Other long term (current) drug therapy: Secondary | ICD-10-CM

## 2023-04-03 DIAGNOSIS — F3181 Bipolar II disorder: Secondary | ICD-10-CM

## 2023-04-03 DIAGNOSIS — F603 Borderline personality disorder: Secondary | ICD-10-CM

## 2023-04-03 NOTE — Telephone Encounter (Signed)
No show for her appt next 12/24

## 2023-04-07 ENCOUNTER — Encounter: Payer: Self-pay | Admitting: Nurse Practitioner

## 2023-04-07 ENCOUNTER — Ambulatory Visit (INDEPENDENT_AMBULATORY_CARE_PROVIDER_SITE_OTHER): Payer: Medicaid Other | Admitting: Nurse Practitioner

## 2023-04-07 VITALS — BP 117/82 | HR 97 | Temp 97.7°F | Resp 16 | Ht 61.0 in | Wt 235.6 lb

## 2023-04-07 DIAGNOSIS — Z79899 Other long term (current) drug therapy: Secondary | ICD-10-CM

## 2023-04-07 DIAGNOSIS — M797 Fibromyalgia: Secondary | ICD-10-CM | POA: Diagnosis not present

## 2023-04-07 DIAGNOSIS — G4709 Other insomnia: Secondary | ICD-10-CM | POA: Diagnosis not present

## 2023-04-07 DIAGNOSIS — F331 Major depressive disorder, recurrent, moderate: Secondary | ICD-10-CM

## 2023-04-07 MED ORDER — GABAPENTIN 300 MG PO CAPS
ORAL_CAPSULE | ORAL | 2 refills | Status: DC
Start: 2023-04-07 — End: 2023-08-14

## 2023-04-07 MED ORDER — SERTRALINE HCL 100 MG PO TABS
100.0000 mg | ORAL_TABLET | Freq: Every day | ORAL | 3 refills | Status: DC
Start: 2023-04-07 — End: 2023-08-14

## 2023-04-07 NOTE — Progress Notes (Signed)
[facility] [address] [place], [place] [address]  Internal MEDICINE  Office Visit Note  Patient Name: Claire Frederick  [id number]  [id number]  Date of Service: 04/07/2023  Chief Complaint  Patient presents with   Follow-up   Depression   Anxiety   Medication Refill    HPI Claire Frederick presents for a follow-up visit for insomnia, bipolar and weight loss.  Doing better at the 1.5 mg dose of vraylar and current dose of lithium, last lithium level was therapeutic Sleeping better with lunesta Just started phentermine, will need to follow up in about 10weeks. Weighed 238 lbs 3 days ago, so have already lost 3 lbs.     Current Medication: Outpatient Encounter Medications as of 04/07/2023  Medication Sig Note   acetaminophen (TYLENOL) 325 MG tablet Take 1 tablet (325 mg total) by mouth every 6 (six) hours as needed for moderate pain.    acyclovir ointment (ZOVIRAX) 5 % Apply 1 application. topically every 3 (three) hours.    albuterol (VENTOLIN HFA) 108 (90 Base) MCG/ACT inhaler Inhale into the lungs every 6 (six) hours as needed for wheezing or shortness of breath.    cariprazine (VRAYLAR) 1.5 MG capsule Take 1 capsule (1.5 mg total) by mouth daily.    Eszopiclone 3 MG TABS Take 1 tablet (3 mg total) by mouth at bedtime. Take immediately before bedtime    fluticasone (FLONASE) 50 MCG/ACT nasal spray Place 2 sprays into both nostrils daily.    hydrOXYzine (ATARAX/VISTARIL) 25 MG tablet Take by mouth.    lithium 300 MG tablet TAKE 1 TABLET BY MOUTH THREE TIMES DAILY    omeprazole (PRILOSEC) 40 MG capsule Take 1 capsule (40 mg total) by mouth 2 (two) times daily before a meal.    ondansetron (ZOFRAN) 4 MG tablet Take by mouth. 10/19/2020: Last had to use approx 2-3 months ago   phentermine (ADIPEX-P) 37.5 MG tablet Take 1 tablet (37.5 mg total) by mouth daily before breakfast.    tiZANidine (ZANAFLEX) 2 MG tablet Take by mouth.    Vitamin D, Ergocalciferol, (DRISDOL) 1.25 MG  (50000 UNIT) CAPS capsule TAKE 1 CAPSULE BY MOUTH EVERY SEVEN (7) DAYS    [DISCONTINUED] gabapentin (NEURONTIN) 300 MG capsule Take 1 capsule by mouth every morning and 2 capsules by mouth every night at bedtime.    [DISCONTINUED] sertraline (ZOLOFT) 100 MG tablet Take 1 tablet (100 mg total) by mouth daily.    etonogestrel (NEXPLANON) 68 MG IMPL implant 1 each (68 mg total) by Subdermal route once for 1 dose.    gabapentin (NEURONTIN) 300 MG capsule Take 1 capsule by mouth every morning and 2 capsules by mouth every night at bedtime.    sertraline (ZOLOFT) 100 MG tablet Take 1 tablet (100 mg total) by mouth daily.    No facility-administered encounter medications on file as of 04/07/2023.    Surgical History: Past Surgical History:  Procedure Laterality Date   ESOPHAGOGASTRODUODENOSCOPY (EGD) WITH PROPOFOL N/A 10/19/2020   Procedure: ESOPHAGOGASTRODUODENOSCOPY (EGD) WITH BIOPSY;  Surgeon: Midge Minium, MD;  Location: [facility];  Service: Endoscopy;  Laterality: N/A;  UPREG   ESOPHAGOGASTRODUODENOSCOPY (EGD) WITH PROPOFOL N/A 06/18/2022   Procedure: ESOPHAGOGASTRODUODENOSCOPY (EGD) WITH PROPOFOL;  Surgeon: Toney Reil, MD;  Location: [facility] ENDOSCOPY;  Service: Gastroenterology;  Laterality: N/A;   FLEXIBLE SIGMOIDOSCOPY N/A 10/30/2021   Procedure: FLEXIBLE SIGMOIDOSCOPY;  Surgeon: Toney Reil, MD;  Location: [facility] ENDOSCOPY;  Service: Gastroenterology;  Laterality: N/A;  Fleet enema 1 hr prior  LAPAROSCOPIC UNILATERAL SALPINGECTOMY Right 11/30/2016   Procedure: LAPAROSCOPIC UNILATERAL SALPINGECTOMY WITH ECTOPIC;  Surgeon: Conard Novak, MD;  Location: [facility] ORS;  Service: Gynecology;  Laterality: Right;    Medical History: Past Medical History:  Diagnosis Date   Anxiety    Asthma    Bipolar disorder (HCC)    Carpal tunnel syndrome, bilateral    both wrists   Depression    Ectopic pregnancy 11/24/2016   Endometriosis    Fibromyalgia    Hearing loss, 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, hard of hearing   Hernia, hiatal    Herpes simplex virus type 1 (HSV-1) dermatitis    Hypothyroidism    Multiple body piercings    including tongue   Plantar fasciitis, bilateral    Sciatica     Family History: Family History  Problem Relation Age of Onset   Depression Mother    Cancer Mother    Arthritis Mother    Anxiety disorder Mother    Alcohol abuse Mother    ADD / ADHD Mother    Drug abuse Mother    Breast cancer Mother    Asthma Father    Hypercholesterolemia Father    Drug abuse Sister    Alcohol abuse Brother    Obesity Son    Depression Son    Asthma Son    Anxiety disorder Son    ADD / ADHD Son    Cancer Paternal Aunt    Ovarian cancer Paternal Aunt    Diabetes Paternal Uncle    Obesity Maternal Grandmother    Depression Maternal Grandmother    Cancer Maternal Grandmother    Anxiety disorder Maternal Grandmother    Breast cancer Maternal Grandmother    Cancer Maternal Grandfather    Lung cancer Maternal Grandfather    Cancer Paternal Grandmother    Ovarian cancer Paternal Grandmother     Social History   Socioeconomic History   Marital status: Legally Separated    Spouse name: Not on file   Number of children: Not on file   Years of education: Not on file   Highest education level: Not on file  Occupational History   Not on file  Tobacco Use   Smoking status: Former    Packs/day: .5    Types: Cigarettes    Quit date: 11/18/2019    Years since quitting: 3.3   Smokeless tobacco: Never  Vaping Use   Vaping Use: Every day  Substance and Sexual Activity   Alcohol use: Not Currently    Comment: socially   Drug use: Yes    Types: Marijuana   Sexual activity: Yes    Partners: Male    Birth control/protection: Implant  Other Topics Concern   Not on file  Social History Narrative   Not on file   Social Determinants of Health   Financial Resource Strain: Not on file  Food Insecurity: Not on file  Transportation  Needs: Not on file  Physical Activity: Not on file  Stress: Not on file  Social Connections: Not on file  Intimate Partner Violence: Not on file      Review of Systems  Constitutional:  Positive for appetite change and fatigue. Negative for chills, fever and unexpected weight change.  HENT:  Positive for trouble swallowing. Negative for postnasal drip, rhinorrhea, sinus pressure, sinus pain, sneezing and tinnitus.        Hair loss  Eyes: Negative.   Respiratory:  Negative for cough, chest tightness, shortness  of breath and wheezing.   Cardiovascular: Negative.  Negative for chest pain and palpitations.  Gastrointestinal:  Positive for abdominal distention (improving), abdominal pain (improving), nausea and vomiting.       Has a hiatal hernia  Endocrine: Negative.   Genitourinary: Negative.  Negative for dysuria.  Musculoskeletal:  Positive for arthralgias, back pain and myalgias.  Skin: Negative.   Neurological:  Negative for dizziness, weakness, numbness and headaches.  Hematological: Negative.   Psychiatric/Behavioral:  Positive for behavioral problems, decreased concentration, dysphoric mood and sleep disturbance. Negative for agitation, confusion, hallucinations, self-injury and suicidal ideas. The patient is nervous/anxious. The patient is not hyperactive.     Vital Signs: BP 117/82   Pulse 97   Temp 97.7 F (36.5 C)   Resp 16   Ht 5\' 1"  (1.549 m)   Wt 235 lb 9.6 oz (106.9 kg)   SpO2 99%   BMI 44.52 kg/m    Physical Exam Vitals reviewed.  Constitutional:      General: She is not in acute distress.    Appearance: Normal appearance. She is obese. She is not ill-appearing.  HENT:     Head: Normocephalic and atraumatic.  Eyes:     Pupils: Pupils are equal, round, and reactive to light.  Cardiovascular:     Rate and Rhythm: Normal rate and regular rhythm.  Pulmonary:     Effort: Pulmonary effort is normal. No respiratory distress.  Abdominal:     General: There  is distension.     Palpations: There is no mass.  Neurological:     Mental Status: She is alert and oriented to person, place, and time.  Psychiatric:        Attention and Perception: Attention normal.        Mood and Affect: Mood normal.        Speech: Speech normal.        Behavior: Behavior normal. Behavior is cooperative.        Thought Content: Thought content normal. Thought content is not paranoid. Thought content does not include homicidal or suicidal ideation.        Assessment/Plan: 1. Other insomnia Continue lunesta as prescribed.   2. Fibromyalgia Continue gabapentin as prescribed.  - gabapentin (NEURONTIN) 300 MG capsule; Take 1 capsule by mouth every morning and 2 capsules by mouth every night at bedtime.  Dispense: 90 capsule; Refill: 2  3. Moderate episode of recurrent major depressive disorder (HCC) Continue sertraline as prescribed.  - sertraline (ZOLOFT) 100 MG tablet; Take 1 tablet (100 mg total) by mouth daily.  Dispense: 30 tablet; Refill: 3  4. Encounter for medication review Continue gabapentin as prescribed  - gabapentin (NEURONTIN) 300 MG capsule; Take 1 capsule by mouth every morning and 2 capsules by mouth every night at bedtime.  Dispense: 90 capsule; Refill: 2  5. Lithium use Repeat lithium level for therapeutic drug monitoring - Lithium level   General Counseling: Rissie verbalizes understanding of the findings of todays visit and agrees with plan of treatment. I have discussed any further diagnostic evaluation that may be needed or ordered today. We also reviewed her medications today. she has been encouraged to call the office with any questions or concerns that should arise related to todays visit.    Orders Placed This Encounter  Procedures   Lithium level    Meds ordered this encounter  Medications   sertraline (ZOLOFT) 100 MG tablet    Sig: Take 1 tablet (100 mg total) by mouth daily.  Dispense:  30 tablet    Refill:  3    gabapentin (NEURONTIN) 300 MG capsule    Sig: Take 1 capsule by mouth every morning and 2 capsules by mouth every night at bedtime.    Dispense:  90 capsule    Refill:  2    Return in about 3 months (around 06/25/2023) for F/U, Weight loss, Marlynn Hinckley PCP.   Total time spent:30 Minutes Time spent includes review of chart, medications, test results, and follow up plan with the patient.   [place] Controlled Substance Database was reviewed by me.  This patient was seen by Sallyanne Kuster, FNP-C in collaboration with Dr. Beverely Risen as a part of collaborative care agreement.   Azucena Dart R. Tedd Sias, MSN, FNP-C Internal medicine

## 2023-05-02 LAB — LITHIUM LEVEL: Lithium Lvl: <0.1 mmol/L — ABNORMAL LOW (ref 0.5–1.2)

## 2023-05-09 ENCOUNTER — Telehealth: Payer: Self-pay

## 2023-05-09 NOTE — Progress Notes (Signed)
Please let the patient know that her lithium level is low. Ask her if she has been remembering to take her medication.

## 2023-05-09 NOTE — Telephone Encounter (Signed)
Left patient a message to give office a call.  

## 2023-05-13 ENCOUNTER — Telehealth: Payer: Self-pay

## 2023-05-13 NOTE — Telephone Encounter (Signed)
Sent patient Mychart message.

## 2023-06-10 ENCOUNTER — Telehealth: Payer: Self-pay

## 2023-06-10 NOTE — Telephone Encounter (Signed)
Pt calling; has an ovarian cyst; has been hurting lately; wondering if she can get an u/s to see if it has grown.  [phone]

## 2023-06-13 ENCOUNTER — Other Ambulatory Visit: Payer: Self-pay | Admitting: Obstetrics

## 2023-06-13 DIAGNOSIS — G8929 Other chronic pain: Secondary | ICD-10-CM

## 2023-06-13 DIAGNOSIS — R102 Pelvic and perineal pain: Secondary | ICD-10-CM

## 2023-06-13 NOTE — Progress Notes (Signed)
The patient has called the office c/o pelvic pain. She was idgnosed with an ovarian cyst in the pst and requests a repeat scan. I have placed an order in for this, and requested she have a visit on the same day wth one of the MD providers, if possible.  Mirna Mires, CNM  06/13/2023 7:50 AM

## 2023-06-13 NOTE — Telephone Encounter (Signed)
I contacted the patient via phone for scheduling. I advised the patient we could scheduled ultrasound and follow up in office for an same day appointment. The patient is requesting for an early appointment for ultrasound. I secure ultrasound asking Mikaela for an work in. I have the patient scheduled for 06/23/23 at 8:15 am. The patient is for follow with Claris Che for Monday, 8/12 at 11:15 am. The patient is okay with not having a same day ultrasound and follow up.

## 2023-06-23 ENCOUNTER — Other Ambulatory Visit: Payer: Self-pay | Admitting: Obstetrics

## 2023-06-23 ENCOUNTER — Ambulatory Visit (INDEPENDENT_AMBULATORY_CARE_PROVIDER_SITE_OTHER): Payer: MEDICAID

## 2023-06-23 DIAGNOSIS — G8929 Other chronic pain: Secondary | ICD-10-CM | POA: Diagnosis not present

## 2023-06-23 DIAGNOSIS — R102 Pelvic and perineal pain: Secondary | ICD-10-CM

## 2023-06-30 ENCOUNTER — Encounter: Payer: Self-pay | Admitting: Obstetrics

## 2023-06-30 ENCOUNTER — Ambulatory Visit: Payer: MEDICAID | Admitting: Obstetrics

## 2023-06-30 NOTE — Telephone Encounter (Signed)
Pt cancelled appt with [facility] today; no reschedule appt.

## 2023-07-07 ENCOUNTER — Ambulatory Visit: Payer: MEDICAID | Admitting: Nurse Practitioner

## 2023-08-05 ENCOUNTER — Other Ambulatory Visit: Payer: Self-pay | Admitting: Gastroenterology

## 2023-08-05 ENCOUNTER — Other Ambulatory Visit: Payer: Self-pay | Admitting: Nurse Practitioner

## 2023-08-05 DIAGNOSIS — F3181 Bipolar II disorder: Secondary | ICD-10-CM

## 2023-08-05 DIAGNOSIS — Z79899 Other long term (current) drug therapy: Secondary | ICD-10-CM

## 2023-08-05 DIAGNOSIS — F603 Borderline personality disorder: Secondary | ICD-10-CM

## 2023-08-05 NOTE — Telephone Encounter (Signed)
Please review and send

## 2023-08-05 NOTE — Telephone Encounter (Signed)
Last office visit 01/30/2023 abdominal bloating  Last refill 01/30/2023  2 refills 60 capsules

## 2023-08-08 ENCOUNTER — Telehealth: Payer: Self-pay

## 2023-08-08 NOTE — Telephone Encounter (Signed)
Left patient a message to give office a callback.Marland Kitchen

## 2023-08-13 ENCOUNTER — Other Ambulatory Visit: Payer: Self-pay

## 2023-08-14 ENCOUNTER — Encounter: Payer: Self-pay | Admitting: Nurse Practitioner

## 2023-08-14 ENCOUNTER — Ambulatory Visit (INDEPENDENT_AMBULATORY_CARE_PROVIDER_SITE_OTHER): Payer: MEDICAID | Admitting: Nurse Practitioner

## 2023-08-14 VITALS — BP 128/88 | HR 85 | Temp 98.5°F | Resp 16 | Ht 61.0 in | Wt 228.2 lb

## 2023-08-14 DIAGNOSIS — J309 Allergic rhinitis, unspecified: Secondary | ICD-10-CM | POA: Diagnosis not present

## 2023-08-14 DIAGNOSIS — R635 Abnormal weight gain: Secondary | ICD-10-CM

## 2023-08-14 DIAGNOSIS — M797 Fibromyalgia: Secondary | ICD-10-CM

## 2023-08-14 DIAGNOSIS — G4709 Other insomnia: Secondary | ICD-10-CM | POA: Diagnosis not present

## 2023-08-14 DIAGNOSIS — F3181 Bipolar II disorder: Secondary | ICD-10-CM

## 2023-08-14 DIAGNOSIS — Z79899 Other long term (current) drug therapy: Secondary | ICD-10-CM

## 2023-08-14 DIAGNOSIS — Z9109 Other allergy status, other than to drugs and biological substances: Secondary | ICD-10-CM

## 2023-08-14 DIAGNOSIS — F331 Major depressive disorder, recurrent, moderate: Secondary | ICD-10-CM | POA: Diagnosis not present

## 2023-08-14 DIAGNOSIS — Z6841 Body Mass Index (BMI) 40.0 and over, adult: Secondary | ICD-10-CM

## 2023-08-14 DIAGNOSIS — F410 Panic disorder [episodic paroxysmal anxiety] without agoraphobia: Secondary | ICD-10-CM

## 2023-08-14 MED ORDER — CARIPRAZINE HCL 1.5 MG PO CAPS: 1.5000 mg | ORAL_CAPSULE | Freq: Every day | ORAL | 5 refills | Status: DC

## 2023-08-14 MED ORDER — SEMAGLUTIDE-WEIGHT MANAGEMENT 0.5 MG/0.5ML ~~LOC~~ SOAJ: 0.5000 mg | SUBCUTANEOUS | 1 refills | Status: DC

## 2023-08-14 MED ORDER — SERTRALINE HCL 100 MG PO TABS
100.0000 mg | ORAL_TABLET | Freq: Every day | ORAL | 3 refills | Status: DC
Start: 2023-08-14 — End: 2024-01-27

## 2023-08-14 MED ORDER — OMEPRAZOLE 40 MG PO CPDR
40.0000 mg | DELAYED_RELEASE_CAPSULE | Freq: Two times a day (BID) | ORAL | 2 refills | Status: DC
Start: 2023-08-14 — End: 2023-12-10

## 2023-08-14 MED ORDER — ESZOPICLONE 3 MG PO TABS: 3.0000 mg | ORAL_TABLET | Freq: Every day | ORAL | 2 refills | Status: DC

## 2023-08-14 MED ORDER — GABAPENTIN 300 MG PO CAPS: ORAL_CAPSULE | ORAL | 2 refills | Status: DC

## 2023-08-18 ENCOUNTER — Telehealth: Payer: Self-pay

## 2023-08-18 ENCOUNTER — Telehealth: Payer: Self-pay | Admitting: Nurse Practitioner

## 2023-08-18 NOTE — Telephone Encounter (Signed)
Awaiting 08/14/23 office notes for Allergy referral-Toni

## 2023-08-18 NOTE — Telephone Encounter (Signed)
Completed P.A. for patient's [id number].

## 2023-09-19 ENCOUNTER — Telehealth: Payer: Self-pay | Admitting: Nurse Practitioner

## 2023-09-19 ENCOUNTER — Encounter: Payer: Self-pay | Admitting: Nurse Practitioner

## 2023-09-19 NOTE — Telephone Encounter (Signed)
Allergy referral sent via [facility] to [facility]. Notified patient. Gave pt telephone # [phone]

## 2023-09-24 ENCOUNTER — Telehealth: Payer: Self-pay | Admitting: Nurse Practitioner

## 2023-09-24 NOTE — Telephone Encounter (Signed)
Lvm & sent mychart msg to move 10/24/23 appointment-Toni

## 2023-10-09 ENCOUNTER — Telehealth: Payer: Self-pay | Admitting: Nurse Practitioner

## 2023-10-09 ENCOUNTER — Ambulatory Visit: Payer: MEDICAID | Admitting: Nurse Practitioner

## 2023-10-09 ENCOUNTER — Encounter: Payer: Self-pay | Admitting: Nurse Practitioner

## 2023-10-09 NOTE — Telephone Encounter (Signed)
After several attempts to r/s 10/31/23 appointment and today's no show, appointment letter mailed to patient-Claire Frederick

## 2023-10-20 ENCOUNTER — Encounter: Payer: Self-pay | Admitting: Nurse Practitioner

## 2023-10-20 ENCOUNTER — Ambulatory Visit: Payer: MEDICAID | Admitting: Nurse Practitioner

## 2023-10-20 VITALS — BP 116/82 | HR 83 | Temp 98.3°F | Resp 16 | Ht 61.0 in | Wt 209.4 lb

## 2023-10-20 DIAGNOSIS — E782 Mixed hyperlipidemia: Secondary | ICD-10-CM | POA: Insufficient documentation

## 2023-10-20 DIAGNOSIS — M5442 Lumbago with sciatica, left side: Secondary | ICD-10-CM | POA: Diagnosis not present

## 2023-10-20 DIAGNOSIS — M5415 Radiculopathy, thoracolumbar region: Secondary | ICD-10-CM | POA: Diagnosis not present

## 2023-10-20 DIAGNOSIS — J4520 Mild intermittent asthma, uncomplicated: Secondary | ICD-10-CM | POA: Diagnosis not present

## 2023-10-20 DIAGNOSIS — G8929 Other chronic pain: Secondary | ICD-10-CM | POA: Insufficient documentation

## 2023-10-20 DIAGNOSIS — E66812 Obesity, class 2: Secondary | ICD-10-CM

## 2023-10-20 DIAGNOSIS — M5441 Lumbago with sciatica, right side: Secondary | ICD-10-CM

## 2023-10-20 DIAGNOSIS — Z6839 Body mass index (BMI) 39.0-39.9, adult: Secondary | ICD-10-CM

## 2023-10-20 DIAGNOSIS — Z79899 Other long term (current) drug therapy: Secondary | ICD-10-CM

## 2023-10-20 MED ORDER — MELOXICAM 15 MG PO TABS
15.0000 mg | ORAL_TABLET | Freq: Every day | ORAL | 1 refills | Status: DC
Start: 2023-10-20 — End: 2024-01-27

## 2023-10-20 MED ORDER — ALBUTEROL SULFATE HFA 108 (90 BASE) MCG/ACT IN AERS: 1.0000 | INHALATION_SPRAY | Freq: Four times a day (QID) | RESPIRATORY_TRACT | 5 refills | Status: DC | PRN

## 2023-10-20 MED ORDER — METHOCARBAMOL 750 MG PO TABS
750.0000 mg | ORAL_TABLET | Freq: Four times a day (QID) | ORAL | 2 refills | Status: DC | PRN
Start: 2023-10-20 — End: 2024-01-27

## 2023-10-20 NOTE — Progress Notes (Signed)
[facility] [address] [place], [place] [address]  Internal MEDICINE  Office Visit Note  Patient Name: Claire Frederick  [id number]  [id number]  Date of Service: 10/20/2023  Chief Complaint  Patient presents with   Depression   Follow-up    Weight loss and eval new med.     HPI Claire Frederick presents for a follow-up visit for low back pain, weight loss labs.  Acute low back pain that radiates up the spine and down both legs -- hurts more after being active at work.  Bilateral plantar fasciitis -- seen by specialist and had cortisone shots that did not help.  Taking 600 mg ibuprofen 4 times a day  Weight loss -- lost 19 lbs since starting wegovy. Denies any adverse side effects, wants to continue wegovy    Current Medication: Outpatient Encounter Medications as of 10/20/2023  Medication Sig Note   acetaminophen (TYLENOL) 325 MG tablet Take 1 tablet (325 mg total) by mouth every 6 (six) hours as needed for moderate pain.    acyclovir ointment (ZOVIRAX) 5 % Apply 1 application. topically every 3 (three) hours.    cariprazine (VRAYLAR) 1.5 MG capsule Take 1 capsule (1.5 mg total) by mouth daily.    Eszopiclone 3 MG TABS Take 1 tablet (3 mg total) by mouth at bedtime. Take immediately before bedtime    fluticasone (FLONASE) 50 MCG/ACT nasal spray Place 2 sprays into both nostrils daily.    gabapentin (NEURONTIN) 300 MG capsule Take 1 capsule by mouth every morning and 2 capsules by mouth every night at bedtime.    hydrOXYzine (ATARAX/VISTARIL) 25 MG tablet Take by mouth.    lithium 300 MG tablet TAKE 1 TABLET BY MOUTH THREE TIMES DAILY    meloxicam (MOBIC) 15 MG tablet Take 1 tablet (15 mg total) by mouth daily. In am with breakfast    methocarbamol (ROBAXIN) 750 MG tablet Take 1-2 tablets (750-1,500 mg total) by mouth every 6 (six) hours as needed (musculoskeletal pain).    omeprazole (PRILOSEC) 40 MG capsule Take 1 capsule (40 mg total) by mouth 2 (two) times daily before a  meal.    ondansetron (ZOFRAN) 4 MG tablet Take by mouth. 10/19/2020: Last had to use approx 2-3 months ago   Semaglutide-Weight Management 0.5 MG/0.5ML SOAJ Inject 0.5 mg into the skin once a week.    sertraline (ZOLOFT) 100 MG tablet Take 1 tablet (100 mg total) by mouth daily.    Vitamin D, Ergocalciferol, (DRISDOL) 1.25 MG (50000 UNIT) CAPS capsule TAKE 1 CAPSULE BY MOUTH EVERY SEVEN (7) DAYS    [DISCONTINUED] albuterol (VENTOLIN HFA) 108 (90 Base) MCG/ACT inhaler Inhale into the lungs every 6 (six) hours as needed for wheezing or shortness of breath.    [DISCONTINUED] tiZANidine (ZANAFLEX) 2 MG tablet Take by mouth.    albuterol (VENTOLIN HFA) 108 (90 Base) MCG/ACT inhaler Inhale 1-2 puffs into the lungs every 6 (six) hours as needed for wheezing or shortness of breath.    etonogestrel (NEXPLANON) 68 MG IMPL implant 1 each (68 mg total) by Subdermal route once for 1 dose.    No facility-administered encounter medications on file as of 10/20/2023.    Surgical History: Past Surgical History:  Procedure Laterality Date   ESOPHAGOGASTRODUODENOSCOPY (EGD) WITH PROPOFOL N/A 10/19/2020   Procedure: ESOPHAGOGASTRODUODENOSCOPY (EGD) WITH BIOPSY;  Surgeon: Midge Minium, MD;  Location: [facility];  Service: Endoscopy;  Laterality: N/A;  UPREG   ESOPHAGOGASTRODUODENOSCOPY (EGD) WITH PROPOFOL N/A 06/18/2022   Procedure: ESOPHAGOGASTRODUODENOSCOPY (  EGD) WITH PROPOFOL;  Surgeon: Toney Reil, MD;  Location: [facility] END[facility]COPY;  Service: Gastroenterology;  Laterality: N/A;   FLEXIBLE SIGMOIDOSCOPY N/A 10/30/2021   Procedure: FLEXIBLE SIGMOIDOSCOPY;  Surgeon: Toney Reil, MD;  Location: [facility] ENDOSCOPY;  Service: Gastroenterology;  Laterality: N/A;  Fleet enema 1 hr prior   LAPAROSCOPIC UNILATERAL SALPINGECTOMY Right 11/30/2016   Procedure: LAPAROSCOPIC UNILATERAL SALPINGECTOMY WITH ECTOPIC;  Surgeon: Conard Novak, MD;  Location: [facility] ORS;  Service: Gynecology;  Laterality: Right;     Medical History: 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, hard of hearing   Hernia, hiatal    Herpes simplex virus type 1 (HSV-1) dermatitis    Hypothyroidism    Multiple body piercings    including tongue   Plantar fasciitis, bilateral    Sciatica     Family History: Family History  Problem Relation Age of Onset   Depression Mother    Cancer Mother    Arthritis Mother    Anxiety disorder Mother    Alcohol abuse Mother    ADD / ADHD Mother    Drug abuse Mother    Breast cancer Mother    Asthma Father    Hypercholesterolemia Father    Drug abuse Sister    Alcohol abuse Brother    Obesity Son    Depression Son    Asthma Son    Anxiety disorder Son    ADD / ADHD Son    Cancer Paternal Aunt    Ovarian cancer Paternal Aunt    Diabetes Paternal Uncle    Obesity Maternal Grandmother    Depression Maternal Grandmother    Cancer Maternal Grandmother    Anxiety disorder Maternal Grandmother    Breast cancer Maternal Grandmother    Cancer Maternal Grandfather    Lung cancer Maternal Grandfather    Cancer Paternal Grandmother    Ovarian cancer Paternal Grandmother     Social History   Socioeconomic History   Marital status: Legally Separated    Spouse name: Not on file   Number of children: Not on file   Years of education: Not on file   Highest education level: Not on file  Occupational History   Not on file  Tobacco Use   Smoking status: Every Day    Current packs/day: 0.00    Types: E-cigarettes, Cigarettes    Last attempt to quit: 11/18/2019    Years since quitting: 3.9   Smokeless tobacco: Never   Tobacco comments:    Vaping.   Vaping Use   Vaping status: Every Day  Substance and Sexual Activity   Alcohol use: Not  Currently    Comment: socially   Drug use: Yes    Types: Marijuana   Sexual activity: Yes    Partners: Male    Birth control/protection: Implant  Other Topics Concern   Not on file  Social History Narrative   Not on file   Social Determinants of Health   Financial Resource Strain: Not on file  Food Insecurity: Not on file  Transportation Needs: Not on file  Physical Activity: Not on file  Stress: Not on file  Social Connections: Not on file  Intimate Partner Violence: Not  on file      Review of Systems  Constitutional:  Positive for appetite change and fatigue. Negative for chills, fever and unexpected weight change.  HENT:  Positive for trouble swallowing. Negative for postnasal drip, rhinorrhea, sinus pressure, sinus pain, sneezing and tinnitus.        Hair loss  Eyes: Negative.   Respiratory:  Negative for cough, chest tightness, shortness of breath and wheezing.   Cardiovascular: Negative.  Negative for chest pain and palpitations.  Gastrointestinal:  Negative for abdominal distention, abdominal pain, nausea and vomiting.       Has a hiatal hernia  Endocrine: Negative.   Genitourinary: Negative.  Negative for dysuria.  Musculoskeletal:  Positive for arthralgias, back pain and myalgias.  Skin: Negative.   Neurological:  Negative for dizziness, weakness, numbness and headaches.  Hematological: Negative.   Psychiatric/Behavioral:  Positive for behavioral problems, decreased concentration, dysphoric mood and sleep disturbance. Negative for agitation, confusion, hallucinations, self-injury and suicidal ideas. The patient is nervous/anxious. The patient is not hyperactive.     Vital Signs: BP 116/82   Pulse 83   Temp 98.3 F (36.8 C)   Resp 16   Ht 5\' 1"  (1.549 m)   Wt 209 lb 6.4 oz (95 kg)   SpO2 99%   BMI 39.57 kg/m    Physical Exam Vitals reviewed.  Constitutional:      General: She is not in acute distress.    Appearance: Normal appearance. She is obese.  She is not ill-appearing.  HENT:     Head: Normocephalic and atraumatic.  Eyes:     Pupils: Pupils are equal, round, and reactive to light.  Cardiovascular:     Rate and Rhythm: Normal rate and regular rhythm.  Pulmonary:     Effort: Pulmonary effort is normal. No respiratory distress.  Abdominal:     General: There is no distension.     Palpations: There is no mass.  Neurological:     Mental Status: She is alert and oriented to person, place, and time.  Psychiatric:        Attention and Perception: Attention normal.        Mood and Affect: Mood normal.        Speech: Speech normal.        Behavior: Behavior normal. Behavior is cooperative.        Thought Content: Thought content normal. Thought content is not paranoid. Thought content does not include homicidal or suicidal ideation.        Assessment/Plan: 1. Acute bilateral low back pain with bilateral sciatica Take meloxicam and robaxin as needed as prescribed.  - methocarbamol (ROBAXIN) 750 MG tablet; Take 1-2 tablets (750-1,500 mg total) by mouth every 6 (six) hours as needed (musculoskeletal pain).  Dispense: 120 tablet; Refill: 2 - meloxicam (MOBIC) 15 MG tablet; Take 1 tablet (15 mg total) by mouth daily. In am with breakfast  Dispense: 30 tablet; Refill: 1  2. Radiculopathy of thoracolumbar region Take meloxicam daily. Do not take ibuprofen when taking meloxicam. May take acetaminophen as needed. Take prn Robaxin as prescribed.  - methocarbamol (ROBAXIN) 750 MG tablet; Take 1-2 tablets (750-1,500 mg total) by mouth every 6 (six) hours as needed (musculoskeletal pain).  Dispense: 120 tablet; Refill: 2 - meloxicam (MOBIC) 15 MG tablet; Take 1 tablet (15 mg total) by mouth daily. In am with breakfast  Dispense: 30 tablet; Refill: 1  3. Mixed hyperlipidemia Repeat labs ordered  - CMP14+EGFR - Lithium level - Lipid Profile  4. Mild intermittent asthma without complication Continue prn rescue inhaler as prescribed.  -  albuterol (VENTOLIN HFA) 108 (90 Base) MCG/ACT inhaler; Inhale 1-2 puffs into the lungs every 6 (six) hours as needed for wheezing or shortness of breath.  Dispense: 6.7 g; Refill: 5  5. Class 2 severe obesity due to excess calories with serious comorbidity and body mass index (BMI) of 39.0 to 39.9 in adult [facility]) Continue wegovy as prescribed. Follow up in 3 months  6. High risk medication use Repeat labs ordered  - CMP14+EGFR - Lithium level - Lipid Profile   General Counseling: Mariann verbalizes understanding of the findings of todays visit and agrees with plan of treatment. I have discussed any further diagnostic evaluation that may be needed or ordered today. We also reviewed her medications today. she has been encouraged to call the office with any questions or concerns that should arise related to todays visit.    Orders Placed This Encounter  Procedures   CMP14+EGFR   Lithium level   Lipid Profile    Meds ordered this encounter  Medications   methocarbamol (ROBAXIN) 750 MG tablet    Sig: Take 1-2 tablets (750-1,500 mg total) by mouth every 6 (six) hours as needed (musculoskeletal pain).    Dispense:  120 tablet    Refill:  2    Fill new script today.   meloxicam (MOBIC) 15 MG tablet    Sig: Take 1 tablet (15 mg total) by mouth daily. In am with breakfast    Dispense:  30 tablet    Refill:  1    Patient aware not to take with other NSAIDs with it.   albuterol (VENTOLIN HFA) 108 (90 Base) MCG/ACT inhaler    Sig: Inhale 1-2 puffs into the lungs every 6 (six) hours as needed for wheezing or shortness of breath.    Dispense:  6.7 g    Refill:  5    Return in about 1 month (around 11/20/2023) for F/U, Quadry Kampa PCP back pain and new med.   Total time spent:30 Minutes Time spent includes review of chart, medications, test results, and follow up plan with the patient.   [place] Controlled Substance Database was reviewed by me.  This patient was seen by Sallyanne Kuster, FNP-C in  collaboration with Dr. Beverely Risen as a part of collaborative care agreement.   Hareem Surowiec R. Tedd Sias, MSN, FNP-C Internal medicine

## 2023-10-31 ENCOUNTER — Encounter: Payer: MEDICAID | Admitting: Nurse Practitioner

## 2023-11-02 ENCOUNTER — Other Ambulatory Visit: Payer: Self-pay | Admitting: Nurse Practitioner

## 2023-11-03 NOTE — Telephone Encounter (Signed)
Please review 10/20/23

## 2023-11-04 ENCOUNTER — Telehealth: Payer: Self-pay

## 2023-11-04 LAB — CMP14+EGFR
ALT: 12 [IU]/L (ref 0–32)
AST: 14 [IU]/L (ref 0–40)
Albumin: 4.1 g/dL (ref 3.9–4.9)
Alkaline Phosphatase: 64 [IU]/L (ref 44–121)
BUN/Creatinine Ratio: 8 — ABNORMAL LOW (ref 9–23)
BUN: 6 mg/dL (ref 6–20)
Bilirubin Total: 0.3 mg/dL (ref 0.0–1.2)
CO2: 19 mmol/L — ABNORMAL LOW (ref 20–29)
Calcium: 9.5 mg/dL (ref 8.7–10.2)
Chloride: 106 mmol/L (ref 96–106)
Creatinine, Ser: 0.75 mg/dL (ref 0.57–1.00)
Globulin, Total: 2.7 g/dL (ref 1.5–4.5)
Glucose: 90 mg/dL (ref 70–99)
Potassium: 4 mmol/L (ref 3.5–5.2)
Sodium: 142 mmol/L (ref 134–144)
Total Protein: 6.8 g/dL (ref 6.0–8.5)
eGFR: 108 mL/min/{1.73_m2} (ref >59)

## 2023-11-04 LAB — LIPID PANEL
Chol/HDL Ratio: 3.5 {ratio} (ref 0.0–4.4)
Cholesterol, Total: 189 mg/dL (ref 100–199)
HDL: 54 mg/dL (ref >39)
LDL Chol Calc (NIH): 117 mg/dL — ABNORMAL HIGH (ref 0–99)
Triglycerides: 100 mg/dL (ref 0–149)
VLDL Cholesterol Cal: 18 mg/dL (ref 5–40)

## 2023-11-04 LAB — LITHIUM LEVEL: Lithium Lvl: <0.1 mmol/L — ABNORMAL LOW (ref 0.5–1.2)

## 2023-11-13 ENCOUNTER — Encounter: Payer: Self-pay | Admitting: Nurse Practitioner

## 2023-11-25 ENCOUNTER — Encounter: Payer: Self-pay | Admitting: Nurse Practitioner

## 2023-11-25 ENCOUNTER — Ambulatory Visit: Payer: MEDICAID | Admitting: Nurse Practitioner

## 2023-11-25 VITALS — BP 110/84 | HR 95 | Temp 98.5°F | Resp 16 | Ht 61.0 in | Wt 197.2 lb

## 2023-11-25 DIAGNOSIS — J4540 Moderate persistent asthma, uncomplicated: Secondary | ICD-10-CM

## 2023-11-25 DIAGNOSIS — J309 Allergic rhinitis, unspecified: Secondary | ICD-10-CM | POA: Diagnosis not present

## 2023-11-25 DIAGNOSIS — Z87891 Personal history of nicotine dependence: Secondary | ICD-10-CM

## 2023-11-25 DIAGNOSIS — J028 Acute pharyngitis due to other specified organisms: Secondary | ICD-10-CM | POA: Diagnosis not present

## 2023-11-25 DIAGNOSIS — Z91410 Personal history of adult physical and sexual abuse: Secondary | ICD-10-CM

## 2023-11-25 DIAGNOSIS — J4520 Mild intermittent asthma, uncomplicated: Secondary | ICD-10-CM

## 2023-11-25 DIAGNOSIS — F09 Unspecified mental disorder due to known physiological condition: Secondary | ICD-10-CM

## 2023-11-25 DIAGNOSIS — B9689 Other specified bacterial agents as the cause of diseases classified elsewhere: Secondary | ICD-10-CM

## 2023-11-25 DIAGNOSIS — F410 Panic disorder [episodic paroxysmal anxiety] without agoraphobia: Secondary | ICD-10-CM

## 2023-11-25 DIAGNOSIS — F3181 Bipolar II disorder: Secondary | ICD-10-CM

## 2023-11-25 MED ORDER — TRELEGY ELLIPTA 200-62.5-25 MCG/ACT IN AEPB: 1.0000 | INHALATION_SPRAY | Freq: Every day | RESPIRATORY_TRACT | 3 refills | Status: DC

## 2023-11-25 MED ORDER — AZITHROMYCIN 250 MG PO TABS: ORAL_TABLET | ORAL | 0 refills | Status: AC

## 2023-11-25 MED ORDER — ALBUTEROL SULFATE HFA 108 (90 BASE) MCG/ACT IN AERS: 1.0000 | INHALATION_SPRAY | Freq: Four times a day (QID) | RESPIRATORY_TRACT | 5 refills | Status: DC | PRN
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
## 2023-11-25 NOTE — Telephone Encounter (Signed)
Patient was seen in office this morning. 

## 2023-11-26 ENCOUNTER — Telehealth: Payer: Self-pay | Admitting: Nurse Practitioner

## 2023-11-26 NOTE — Telephone Encounter (Signed)
 BH referral sent via Epic to [facility]

## 2023-12-10 ENCOUNTER — Other Ambulatory Visit: Payer: Self-pay | Admitting: Nurse Practitioner

## 2023-12-10 DIAGNOSIS — Z79899 Other long term (current) drug therapy: Secondary | ICD-10-CM

## 2023-12-10 DIAGNOSIS — F3181 Bipolar II disorder: Secondary | ICD-10-CM

## 2023-12-10 DIAGNOSIS — F603 Borderline personality disorder: Secondary | ICD-10-CM

## 2023-12-10 NOTE — Telephone Encounter (Signed)
Please review send

## 2023-12-15 ENCOUNTER — Telehealth: Payer: Self-pay | Admitting: Nurse Practitioner

## 2023-12-15 NOTE — Telephone Encounter (Signed)
Per Blima Singer w/ [facility], referral has been closed due to no return call from patient -Sheralyn Boatman

## 2023-12-29 ENCOUNTER — Ambulatory Visit: Payer: MEDICAID | Admitting: Nurse Practitioner

## 2024-01-21 ENCOUNTER — Other Ambulatory Visit: Payer: Self-pay | Admitting: Nurse Practitioner

## 2024-01-21 DIAGNOSIS — F3181 Bipolar II disorder: Secondary | ICD-10-CM

## 2024-01-21 DIAGNOSIS — Z79899 Other long term (current) drug therapy: Secondary | ICD-10-CM

## 2024-01-21 DIAGNOSIS — F603 Borderline personality disorder: Secondary | ICD-10-CM

## 2024-01-21 NOTE — Telephone Encounter (Signed)
 Pease review and send

## 2024-01-27 ENCOUNTER — Encounter: Payer: Self-pay | Admitting: Nurse Practitioner

## 2024-01-27 ENCOUNTER — Ambulatory Visit: Payer: MEDICAID | Admitting: Nurse Practitioner

## 2024-01-27 VITALS — BP 118/84 | HR 97 | Temp 98.9°F | Resp 16 | Ht 61.0 in | Wt 198.8 lb

## 2024-01-27 DIAGNOSIS — Z3040 Encounter for surveillance of contraceptives, unspecified: Secondary | ICD-10-CM

## 2024-01-27 DIAGNOSIS — Z6839 Body mass index (BMI) 39.0-39.9, adult: Secondary | ICD-10-CM

## 2024-01-27 DIAGNOSIS — G8929 Other chronic pain: Secondary | ICD-10-CM

## 2024-01-27 DIAGNOSIS — G4709 Other insomnia: Secondary | ICD-10-CM

## 2024-01-27 DIAGNOSIS — Z3046 Encounter for surveillance of implantable subdermal contraceptive: Secondary | ICD-10-CM

## 2024-01-27 DIAGNOSIS — K219 Gastro-esophageal reflux disease without esophagitis: Secondary | ICD-10-CM | POA: Diagnosis not present

## 2024-01-27 DIAGNOSIS — E66812 Obesity, class 2: Secondary | ICD-10-CM

## 2024-01-27 DIAGNOSIS — Z79899 Other long term (current) drug therapy: Secondary | ICD-10-CM

## 2024-01-27 DIAGNOSIS — F410 Panic disorder [episodic paroxysmal anxiety] without agoraphobia: Secondary | ICD-10-CM

## 2024-01-27 DIAGNOSIS — M5415 Radiculopathy, thoracolumbar region: Secondary | ICD-10-CM

## 2024-01-27 DIAGNOSIS — F331 Major depressive disorder, recurrent, moderate: Secondary | ICD-10-CM

## 2024-01-27 DIAGNOSIS — M5441 Lumbago with sciatica, right side: Secondary | ICD-10-CM

## 2024-01-27 DIAGNOSIS — M5442 Lumbago with sciatica, left side: Secondary | ICD-10-CM | POA: Diagnosis not present

## 2024-01-27 MED ORDER — MELOXICAM 15 MG PO TABS: 15.0000 mg | ORAL_TABLET | Freq: Every day | ORAL | 1 refills | Status: DC

## 2024-01-27 MED ORDER — CARIPRAZINE HCL 1.5 MG PO CAPS: 1.5000 mg | ORAL_CAPSULE | Freq: Every day | ORAL | 5 refills | Status: DC

## 2024-01-27 MED ORDER — ESZOPICLONE 3 MG PO TABS: 3.0000 mg | ORAL_TABLET | Freq: Every day | ORAL | 2 refills | Status: DC

## 2024-01-27 MED ORDER — NEXPLANON 68 MG ~~LOC~~ IMPL: 1.0000 | DRUG_IMPLANT | Freq: Once | SUBCUTANEOUS | Status: AC

## 2024-01-27 MED ORDER — GABAPENTIN 300 MG PO CAPS: ORAL_CAPSULE | ORAL | 2 refills | Status: DC

## 2024-01-27 MED ORDER — OMEPRAZOLE 40 MG PO CPDR: DELAYED_RELEASE_CAPSULE | ORAL | 5 refills | Status: DC

## 2024-01-27 MED ORDER — SEMAGLUTIDE-WEIGHT MANAGEMENT 1 MG/0.5ML ~~LOC~~ SOAJ: 1.0000 mg | SUBCUTANEOUS | 5 refills | Status: DC

## 2024-01-27 MED ORDER — METHOCARBAMOL 750 MG PO TABS
750.0000 mg | ORAL_TABLET | Freq: Four times a day (QID) | ORAL | 2 refills | Status: AC | PRN
Start: 2024-01-27 — End: ?

## 2024-01-27 MED ORDER — SERTRALINE HCL 100 MG PO TABS: 100.0000 mg | ORAL_TABLET | Freq: Every day | ORAL | 3 refills | Status: DC

## 2024-01-27 NOTE — Progress Notes (Signed)
 [facility] [address] [place], [place] [address]  Internal MEDICINE  Office Visit Note  Patient Name: Claire Frederick  [id number]  [id number]  Date of Service: 01/27/2024  Chief Complaint  Patient presents with   Depression   Follow-up    HPI Claire Frederick presents for a follow-up visit for weight loss, insomnia, anxiety, depression and GERD, chronic back pain  Weight loss -- has been taking wegovy, has lost about 50 lbs so far, lost 11 lbs since her last office visit. Appetite is increasing and weight loss has slowed down over the past few weeks. Interested in increasing the wegovy dose  Insomnia -- lunesta continues to be effective. Due for refills.  Anxiety and depression -- controlled with sertraline and vraylar, due for refills  GERd -- symptoms controlled with omeprazole Chronic back pain -- taking meloxicam, gabapentin and prn methocarbamol.     Current Medication: Outpatient Encounter Medications as of 01/27/2024  Medication Sig Note   acetaminophen (TYLENOL) 325 MG tablet Take 1 tablet (325 mg total) by mouth every 6 (six) hours as needed for moderate pain.    acyclovir ointment (ZOVIRAX) 5 % Apply 1 application. topically every 3 (three) hours.    albuterol (VENTOLIN HFA) 108 (90 Base) MCG/ACT inhaler Inhale 1-2 puffs into the lungs every 6 (six) hours as needed for wheezing or shortness of breath.    fluticasone (FLONASE) 50 MCG/ACT nasal spray Place 2 sprays into both nostrils daily.    Fluticasone-Umeclidin-Vilant (TRELEGY ELLIPTA) 200-62.5-25 MCG/ACT AEPB Inhale 1 puff into the lungs daily.    hydrOXYzine (ATARAX/VISTARIL) 25 MG tablet Take by mouth.    lithium 300 MG tablet TAKE 1 TABLET BY MOUTH THREE TIMES DAILY    ondansetron (ZOFRAN) 4 MG tablet Take by mouth. 10/19/2020: Last had to use approx 2-3 months ago   Semaglutide-Weight Management 1 MG/0.5ML SOAJ Inject 1 mg into the skin once a week.    Vitamin D, Ergocalciferol, (DRISDOL) 1.25 MG (50000  UNIT) CAPS capsule TAKE 1 CAPSULE BY MOUTH EVERY SEVEN (7) DAYS    [DISCONTINUED] cariprazine (VRAYLAR) 1.5 MG capsule Take 1 capsule (1.5 mg total) by mouth daily.    [DISCONTINUED] Eszopiclone 3 MG TABS Take 1 tablet (3 mg total) by mouth at bedtime. Take immediately before bedtime    [DISCONTINUED] gabapentin (NEURONTIN) 300 MG capsule Take 1 capsule by mouth every morning and 2 capsules by mouth every night at bedtime.    [DISCONTINUED] meloxicam (MOBIC) 15 MG tablet Take 1 tablet (15 mg total) by mouth daily. In am with breakfast    [DISCONTINUED] methocarbamol (ROBAXIN) 750 MG tablet Take 1-2 tablets (750-1,500 mg total) by mouth every 6 (six) hours as needed (musculoskeletal pain).    [DISCONTINUED] omeprazole (PRILOSEC) 40 MG capsule TAKE 1 CAPSULE BY MOUTH TWICE DAILY BEFORE A MEAL    [DISCONTINUED] Semaglutide-Weight Management (WEGOVY) 0.5 MG/0.5ML SOAJ INJECT 0.5MG  SUBCUTANEOUSLY ONCE WEEKLY    [DISCONTINUED] sertraline (ZOLOFT) 100 MG tablet Take 1 tablet (100 mg total) by mouth daily.    cariprazine (VRAYLAR) 1.5 MG capsule Take 1 capsule (1.5 mg total) by mouth daily.    Eszopiclone 3 MG TABS Take 1 tablet (3 mg total) by mouth at bedtime. Take immediately before bedtime    etonogestrel (NEXPLANON) 68 MG IMPL implant 1 each (68 mg total) by Subdermal route once for 1 dose. New implant placed on 02/19/2023    gabapentin (NEURONTIN) 300 MG capsule Take 1 capsule by mouth every morning and 2 capsules by mouth  every night at bedtime.    meloxicam (MOBIC) 15 MG tablet Take 1 tablet (15 mg total) by mouth daily. In am with breakfast    methocarbamol (ROBAXIN) 750 MG tablet Take 1-2 tablets (750-1,500 mg total) by mouth every 6 (six) hours as needed (musculoskeletal pain).    omeprazole (PRILOSEC) 40 MG capsule TAKE 1 CAPSULE BY MOUTH TWICE DAILY BEFORE A MEAL    sertraline (ZOLOFT) 100 MG tablet Take 1 tablet (100 mg total) by mouth daily.    [DISCONTINUED] etonogestrel (NEXPLANON) 68 MG IMPL  implant 1 each (68 mg total) by Subdermal route once for 1 dose.    No facility-administered encounter medications on file as of 01/27/2024.    Surgical History: Past Surgical History:  Procedure Laterality Date   ESOPHAGOGASTRODUODENOSCOPY (EGD) WITH PROPOFOL N/A 10/19/2020   Procedure: ESOPHAGOGASTRODUODENOSCOPY (EGD) WITH BIOPSY;  Surgeon: Midge Minium, MD;  Location: [facility];  Service: Endoscopy;  Laterality: N/A;  UPREG   ESOPHAGOGASTRODUODENOSCOPY (EGD) WITH PROPOFOL N/A 06/18/2022   Procedure: ESOPHAGOGASTRODUODENOSCOPY (EGD) WITH PROPOFOL;  Surgeon: Toney Reil, MD;  Location: [facility];  Service: Gastroenterology;  Laterality: N/A;   FLEXIBLE SIGMOIDOSCOPY N/A 10/30/2021   Procedure: FLEXIBLE SIGMOIDOSCOPY;  Surgeon: Toney Reil, MD;  Location: [facility];  Service: Gastroenterology;  Laterality: N/A;  Fleet enema 1 hr prior   LAPAROSCOPIC UNILATERAL SALPINGECTOMY Right 11/30/2016   Procedure: LAPAROSCOPIC UNILATERAL SALPINGECTOMY WITH ECTOPIC;  Surgeon: Conard Novak, MD;  Location: [facility];  Service: Gynecology;  Laterality: Right;    Medical History: 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, hard of hearing   Hernia, hiatal    Herpes simplex virus type 1 (HSV-1) dermatitis    Hypothyroidism    Multiple body piercings    including tongue   Plantar fasciitis, bilateral    Sciatica     Family History: Family History  Problem Relation Age of Onset   Depression Mother    Cancer Mother    Arthritis Mother    Anxiety disorder Mother    Alcohol abuse Mother    ADD / ADHD Mother    Drug abuse Mother    Breast cancer Mother    Asthma Father    Hypercholesterolemia Father    Drug abuse Sister    Alcohol  abuse Brother    Obesity Son    Depression Son    Asthma Son    Anxiety disorder Son    ADD / ADHD Son    Cancer Paternal Aunt    Ovarian cancer Paternal Aunt    Diabetes Paternal Uncle    Obesity Maternal Grandmother    Depression Maternal Grandmother    Cancer Maternal Grandmother    Anxiety disorder Maternal Grandmother    Breast cancer Maternal Grandmother    Cancer Maternal Grandfather    Lung cancer Maternal Grandfather    Cancer Paternal Grandmother    Ovarian cancer Paternal Grandmother     Social History   Socioeconomic History   Marital status: Legally Separated    Spouse name: Not on file   Number of children: Not on file   Years of education: Not on file   Highest education level: Not on file  Occupational History   Not on file  Tobacco Use   Smoking status: Former    Current packs/day: 0.00    Types: E-cigarettes, Cigarettes    Quit date: 11/18/2019    Years since quitting: 4.1   Smokeless tobacco: Never   Tobacco comments:    Vaping.  Vaping Use   Vaping status: Every Day  Substance and Sexual Activity   Alcohol use: Not Currently    Comment: socially   Drug use: Yes    Types: Marijuana   Sexual activity: Yes    Partners: Male    Birth control/protection: Implant  Other Topics Concern   Not on file  Social History Narrative   Not on file   Social Drivers of Health   Financial Resource Strain: Not on file  Food Insecurity: Not on file  Transportation Needs: Not on file  Physical Activity: Not on file  Stress: Not on file  Social Connections: Not on file  Intimate Partner Violence: Not on file      Review of Systems  Constitutional:  Positive for appetite change and fatigue. Negative for chills, fever and unexpected weight change.  HENT:  Positive for trouble swallowing. Negative for postnasal drip, rhinorrhea, sinus pressure, sinus pain, sneezing and tinnitus.        Hair loss  Eyes: Negative.   Respiratory:  Negative for cough,  chest tightness, shortness of breath and wheezing.   Cardiovascular: Negative.  Negative for chest pain and palpitations.  Gastrointestinal:  Positive for abdominal distention (improving), abdominal pain (improving), nausea and vomiting.       Has a hiatal hernia  Endocrine: Negative.   Genitourinary: Negative.  Negative for dysuria.  Musculoskeletal:  Positive for arthralgias, back pain and myalgias.  Skin: Negative.   Neurological:  Negative for dizziness, weakness, numbness and headaches.  Hematological: Negative.   Psychiatric/Behavioral:  Positive for behavioral problems, decreased concentration, dysphoric mood and sleep disturbance. Negative for agitation, confusion, hallucinations, self-injury and suicidal ideas. The patient is nervous/anxious. The patient is not hyperactive.     Vital Signs: BP 118/84   Pulse 97   Temp 98.9 F (37.2 C)   Resp 16   Ht 5\' 1"  (1.549 m)   Wt 198 lb 12.8 oz (90.2 kg)   SpO2 98%   BMI 37.56 kg/m    Physical Exam Vitals reviewed.  Constitutional:      General: She is not in acute distress.    Appearance: Normal appearance. She is obese. She is not ill-appearing.  HENT:     Head: Normocephalic and atraumatic.  Eyes:     Pupils: Pupils are equal, round, and reactive to light.  Cardiovascular:     Rate and Rhythm: Normal rate and regular rhythm.  Pulmonary:     Effort: Pulmonary effort is normal. No respiratory distress.  Neurological:     Mental Status: She is alert and oriented to person, place, and time.  Psychiatric:        Mood and Affect: Mood normal.        Behavior: Behavior normal.        Assessment/Plan: 1. Chronic bilateral low back pain with bilateral sciatica (Primary) Continue gabapentin, methocarbamol and meloxicam as prescribed  - gabapentin (NEURONTIN) 300 MG capsule; Take 1 capsule by mouth every morning and 2 capsules by mouth every night at bedtime.  Dispense: 90 capsule; Refill: 2 - meloxicam (MOBIC) 15 MG  tablet; Take 1 tablet (15 mg total) by mouth daily. In am with breakfast  Dispense: 30 tablet; Refill: 1 - methocarbamol (ROBAXIN) 750 MG tablet; Take 1-2 tablets (750-1,500 mg total) by mouth every 6 (six) hours as needed (musculoskeletal pain).  Dispense: 120 tablet; Refill: 2  2. Radiculopathy of thoracolumbar region Continue gabapentin, methocarbamol and meloxicam as prescribed  - gabapentin (NEURONTIN) 300 MG capsule; Take 1 capsule by mouth every morning and 2 capsules by mouth every night at bedtime.  Dispense: 90 capsule; Refill: 2 - meloxicam (MOBIC) 15 MG tablet; Take 1 tablet (15 mg total) by mouth daily. In am with breakfast  Dispense: 30 tablet; Refill: 1 - methocarbamol (ROBAXIN) 750 MG tablet; Take 1-2 tablets (750-1,500 mg total) by mouth every 6 (six) hours as needed (musculoskeletal pain).  Dispense: 120 tablet; Refill: 2  3. Gastroesophageal reflux disease without esophagitis Continue omeprazole as prescribed  - omeprazole (PRILOSEC) 40 MG capsule; TAKE 1 CAPSULE BY MOUTH TWICE DAILY BEFORE A MEAL  Dispense: 60 capsule; Refill: 5  4. Other insomnia Continue lunesta as prescribed, follow up in 3 months for additional refills.  - Eszopiclone 3 MG TABS; Take 1 tablet (3 mg total) by mouth at bedtime. Take immediately before bedtime  Dispense: 30 tablet; Refill: 2  5. Class 2 severe obesity due to excess calories with serious comorbidity and body mass index (BMI) of 39.0 to 39.9 in adult [facility]) Wegovy dose increased, follow up in 3 months  - Semaglutide-Weight Management 1 MG/0.5ML SOAJ; Inject 1 mg into the skin once a week.  Dispense: 2 mL; Refill: 5  6. Surveillance of contraceptive implant Nexplanon implant was replaced in April last year  - etonogestrel (NEXPLANON) 68 MG IMPL implant; 1 each (68 mg total) by Subdermal route once for 1 dose. New implant placed on 02/19/2023  7. Moderate episode of recurrent major depressive disorder (HCC) Continue sertraline and vraylar as  prescribed. - cariprazine (VRAYLAR) 1.5 MG capsule; Take 1 capsule (1.5 mg total) by mouth daily.  Dispense: 30 capsule; Refill: 5 - sertraline (ZOLOFT) 100 MG tablet; Take 1 tablet (100 mg total) by mouth daily.  Dispense: 30 tablet; Refill: 3  8. Severe anxiety with panic Continue sertraline and vraylar as prescribed  - cariprazine (VRAYLAR) 1.5 MG capsule; Take 1 capsule (1.5 mg total) by mouth daily.  Dispense: 30 capsule; Refill: 5 - sertraline (ZOLOFT) 100 MG tablet; Take 1 tablet (100 mg total) by mouth daily.  Dispense: 30 tablet; Refill: 3   General Counseling: Anjali verbalizes understanding of the findings of todays visit and agrees with plan of treatment. I have discussed any further diagnostic evaluation that may be needed or ordered today. We also reviewed her medications today. she has been encouraged to call the office with any questions or concerns that should arise related to todays visit.    No orders of the defined types were placed in this encounter.   Meds ordered this encounter  Medications   cariprazine (VRAYLAR) 1.5 MG capsule    Sig: Take 1 capsule (1.5 mg total) by mouth daily.    Dispense:  30 capsule    Refill:  5    Note decreased dose. Discontinue 3 mg dose, and fill script for 1.5 mg dose now.   gabapentin (NEURONTIN) 300 MG capsule    Sig: Take 1 capsule by mouth every morning and 2 capsules by mouth every night at bedtime.    Dispense:  90 capsule    Refill:  2   meloxicam (MOBIC) 15 MG tablet    Sig: Take 1 tablet (15 mg  total) by mouth daily. In am with breakfast    Dispense:  30 tablet    Refill:  1    Patient aware not to take with other NSAIDs with it.   methocarbamol (ROBAXIN) 750 MG tablet    Sig: Take 1-2 tablets (750-1,500 mg total) by mouth every 6 (six) hours as needed (musculoskeletal pain).    Dispense:  120 tablet    Refill:  2    Fill new script today.   omeprazole (PRILOSEC) 40 MG capsule    Sig: TAKE 1 CAPSULE BY MOUTH TWICE  DAILY BEFORE A MEAL    Dispense:  60 capsule    Refill:  5    For future refills   sertraline (ZOLOFT) 100 MG tablet    Sig: Take 1 tablet (100 mg total) by mouth daily.    Dispense:  30 tablet    Refill:  3   Eszopiclone 3 MG TABS    Sig: Take 1 tablet (3 mg total) by mouth at bedtime. Take immediately before bedtime    Dispense:  30 tablet    Refill:  2    Do not run through insurance, patient has good rx coupon   etonogestrel (NEXPLANON) 68 MG IMPL implant    Sig: 1 each (68 mg total) by Subdermal route once for 1 dose. New implant placed on 02/19/2023   Semaglutide-Weight Management 1 MG/0.5ML SOAJ    Sig: Inject 1 mg into the skin once a week.    Dispense:  2 mL    Refill:  5    Fill new script today, note increased dose.    Return in about 3 months (around 04/28/2024) for F/U, Weight loss, Lynanne Delgreco PCP and lunesta refills.   Total time spent:30 Minutes Time spent includes review of chart, medications, test results, and follow up plan with the patient.    Controlled Substance Database was reviewed by me.  This patient was seen by Sallyanne Kuster, FNP-C in collaboration with Dr. Beverely Risen as a part of collaborative care agreement.   Sakiya Stepka R. Tedd Sias, MSN, FNP-C Internal medicine

## 2024-02-13 ENCOUNTER — Other Ambulatory Visit: Payer: Self-pay | Admitting: Nurse Practitioner

## 2024-02-13 DIAGNOSIS — Z79899 Other long term (current) drug therapy: Secondary | ICD-10-CM

## 2024-02-13 DIAGNOSIS — F3181 Bipolar II disorder: Secondary | ICD-10-CM

## 2024-02-13 DIAGNOSIS — F603 Borderline personality disorder: Secondary | ICD-10-CM

## 2024-03-08 ENCOUNTER — Encounter: Payer: Self-pay | Admitting: Nurse Practitioner

## 2024-03-08 ENCOUNTER — Ambulatory Visit: Payer: MEDICAID | Admitting: Nurse Practitioner

## 2024-03-08 VITALS — BP 110/80 | HR 110 | Temp 98.7°F | Resp 16 | Ht 61.0 in | Wt 192.4 lb

## 2024-03-08 DIAGNOSIS — M5441 Lumbago with sciatica, right side: Secondary | ICD-10-CM

## 2024-03-08 DIAGNOSIS — Z202 Contact with and (suspected) exposure to infections with a predominantly sexual mode of transmission: Secondary | ICD-10-CM

## 2024-03-08 DIAGNOSIS — A599 Trichomoniasis, unspecified: Secondary | ICD-10-CM

## 2024-03-08 DIAGNOSIS — M5442 Lumbago with sciatica, left side: Secondary | ICD-10-CM | POA: Diagnosis not present

## 2024-03-08 DIAGNOSIS — R319 Hematuria, unspecified: Secondary | ICD-10-CM | POA: Diagnosis not present

## 2024-03-08 DIAGNOSIS — N390 Urinary tract infection, site not specified: Secondary | ICD-10-CM | POA: Diagnosis not present

## 2024-03-08 DIAGNOSIS — G8929 Other chronic pain: Secondary | ICD-10-CM

## 2024-03-08 DIAGNOSIS — E66812 Obesity, class 2: Secondary | ICD-10-CM

## 2024-03-08 DIAGNOSIS — Z6839 Body mass index (BMI) 39.0-39.9, adult: Secondary | ICD-10-CM

## 2024-03-08 LAB — POCT URINALYSIS DIPSTICK
Bilirubin, UA: NEGATIVE
Glucose, UA: NEGATIVE
Nitrite, UA: NEGATIVE
Protein, UA: NEGATIVE
Spec Grav, UA: >=1.03 — AB (ref 1.010–1.025)
Urobilinogen, UA: 0.2 U/dL
pH, UA: 5 (ref 5.0–8.0)

## 2024-03-08 MED ORDER — SEMAGLUTIDE-WEIGHT MANAGEMENT 1.7 MG/0.75ML ~~LOC~~ SOAJ: 1.7000 mg | SUBCUTANEOUS | 5 refills | Status: DC

## 2024-03-08 MED ORDER — PHENAZOPYRIDINE HCL 200 MG PO TABS: 200.0000 mg | ORAL_TABLET | Freq: Three times a day (TID) | ORAL | 0 refills | Status: DC | PRN

## 2024-03-08 MED ORDER — METRONIDAZOLE 500 MG PO TABS
500.0000 mg | ORAL_TABLET | Freq: Two times a day (BID) | ORAL | 0 refills | Status: AC
Start: 2024-03-08 — End: 2024-03-15

## 2024-03-08 MED ORDER — NITROFURANTOIN MONOHYD MACRO 100 MG PO CAPS
100.0000 mg | ORAL_CAPSULE | Freq: Two times a day (BID) | ORAL | 0 refills | Status: AC
Start: 2024-03-08 — End: 2024-03-15

## 2024-03-08 MED ORDER — TRAMADOL HCL 50 MG PO TABS: 50.0000 mg | ORAL_TABLET | Freq: Four times a day (QID) | ORAL | 0 refills | Status: AC | PRN

## 2024-03-08 NOTE — Progress Notes (Signed)
 [facility] [address] [place], [place] [address]  Internal MEDICINE  Office Visit Note  Patient Name: Claire Frederick  [id number]  [id number]  Date of Service: 03/08/2024  Chief Complaint  Patient presents with   Acute Visit     HPI Lazette presents for an acute sick visit for STD testing Her partner contracted an STD and did not tell her which one.  Went to urgent care, she was tested for STDs, HIV and UTI. The STD testing was for chlamydia, gonorrhea and mycoplasma. Urine culture was positive for staph cohnii.  Per patient report today, she has copious amounts of foul-smelling green vaginal discharge. She was not tested for trichomonas at urgent care. She is requesting to be retested which is a reasonable request.     Current Medication:  Outpatient Encounter Medications as of 03/08/2024  Medication Sig Note   acetaminophen  (TYLENOL ) 325 MG tablet Take 1 tablet (325 mg total) by mouth every 6 (six) hours as needed for moderate pain.    acyclovir  ointment (ZOVIRAX ) 5 % Apply 1 application. topically every 3 (three) hours.    albuterol  (VENTOLIN  HFA) 108 (90 Base) MCG/ACT inhaler Inhale 1-2 puffs into the lungs every 6 (six) hours as needed for wheezing or shortness of breath.    cariprazine  (VRAYLAR ) 1.5 MG capsule Take 1 capsule (1.5 mg total) by mouth daily.    Eszopiclone  3 MG TABS Take 1 tablet (3 mg total) by mouth at bedtime. Take immediately before bedtime    fluticasone (FLONASE) 50 MCG/ACT nasal spray Place 2 sprays into both nostrils daily.    Fluticasone-Umeclidin-Vilant (TRELEGY ELLIPTA ) 200-62.5-25 MCG/ACT AEPB Inhale 1 puff into the lungs daily.    gabapentin  (NEURONTIN ) 300 MG capsule Take 1 capsule by mouth every morning and 2 capsules by mouth every night at bedtime.    hydrOXYzine  (ATARAX /VISTARIL ) 25 MG tablet Take by mouth.    lithium  300 MG tablet TAKE 1 TABLET BY MOUTH THREE TIMES DAILY    meloxicam  (MOBIC ) 15 MG tablet Take 1 tablet (15 mg  total) by mouth daily. In am with breakfast    methocarbamol  (ROBAXIN ) 750 MG tablet Take 1-2 tablets (750-1,500 mg total) by mouth every 6 (six) hours as needed (musculoskeletal pain).    metroNIDAZOLE  (FLAGYL ) 500 MG tablet Take 1 tablet (500 mg total) by mouth 2 (two) times daily for 7 days.    nitrofurantoin , macrocrystal-monohydrate, (MACROBID ) 100 MG capsule Take 1 capsule (100 mg total) by mouth 2 (two) times daily for 7 days. Fill new script today.    omeprazole  (PRILOSEC) 40 MG capsule TAKE 1 CAPSULE BY MOUTH TWICE DAILY BEFORE A MEAL    ondansetron  (ZOFRAN ) 4 MG tablet Take by mouth. 10/19/2020: Last had to use approx 2-3 months ago   Semaglutide -Weight Management 1 MG/0.5ML SOAJ Inject 1 mg into the skin once a week.    sertraline  (ZOLOFT ) 100 MG tablet Take 1 tablet (100 mg total) by mouth daily.    Vitamin D , Ergocalciferol , (DRISDOL ) 1.25 MG (50000 UNIT) CAPS capsule TAKE 1 CAPSULE BY MOUTH EVERY SEVEN (7) DAYS    etonogestrel  (NEXPLANON ) 68 MG IMPL implant 1 each (68 mg total) by Subdermal route once for 1 dose. New implant placed on 02/19/2023    No facility-administered encounter medications on file as of 03/08/2024.      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  Respiratory: Negative.  Negative for cough, chest  tightness, shortness of breath and wheezing.   Cardiovascular: Negative.  Negative for chest pain and palpitations.  Gastrointestinal: Negative.   Genitourinary:  Positive for dysuria, frequency, pelvic pain, urgency, vaginal discharge and vaginal pain.  Musculoskeletal:  Positive for arthralgias and back pain.    Physical Exam Vitals reviewed.  Constitutional:      General: She is not in acute distress.    Appearance: Normal appearance. She is obese. She is not ill-appearing.  HENT:     Head: Normocephalic and atraumatic.  Eyes:     Pupils: Pupils are equal, round, and reactive to light.  Cardiovascular:     Rate and Rhythm: Normal rate and regular rhythm.  Pulmonary:     Effort: Pulmonary effort is normal. No respiratory distress.  Skin:    Capillary Refill: Capillary refill takes less than 2 seconds.  Neurological:     Mental Status: She is alert and oriented to person, place, and time.  Psychiatric:        Mood and Affect: Mood normal.        Behavior: Behavior normal.       Assessment/Plan: 1. Infection due to trichomonas vaginalis (Primary) Metronidazole  prescribed, take until gone.  - metroNIDAZOLE  (FLAGYL ) 500 MG tablet; Take 1 tablet (500 mg total) by mouth 2 (two) times daily for 7 days.  Dispense: 14 tablet; Refill: 0  2. Urinary tract infection with hematuria, site unspecified Urine culture sent and macrobid  prescribed. Pyridium  prescribed for bladder spasms and pain.  - POCT urinalysis dipstick - CULTURE, URINE COMPREHENSIVE - nitrofurantoin , macrocrystal-monohydrate, (MACROBID ) 100 MG capsule; Take 1 capsule (100 mg total) by mouth 2 (two) times daily for 7 days. Fill new script today.  Dispense: 14 capsule; Refill: 0 - phenazopyridine  (PYRIDIUM ) 200 MG tablet; Take 1 tablet (200 mg total) by mouth 3 (three) times daily as needed for pain.  Dispense: 15 tablet; Refill: 0  3. Chronic bilateral low back pain with bilateral sciatica Continue prn tramadol  as  prescribed.  - traMADol  (ULTRAM ) 50 MG tablet; Take 1 tablet (50 mg total) by mouth every 6 (six) hours as needed for up to 5 days for moderate pain (pain score 4-6) or severe pain (pain score 7-10).  Dispense: 20 tablet; Refill: 0  4. Class 2 severe obesity due to excess calories with serious comorbidity and body mass index (BMI) of 39.0 to 39.9 in adult [facility]) Continue wegovy  as prescribed.  - Semaglutide -Weight Management 1.7 MG/0.75ML SOAJ; Inject 1.7 mg into the skin once a week.  Dispense: 3 mL; Refill: 5  5. Exposure to sexually transmitted disease (STD) Urine sent for STD testing  - Chlamydia/Gonococcus/Trichomonas, NAA   General  Counseling: Kameria verbalizes understanding of the findings of todays visit and agrees with plan of treatment. I have discussed any further diagnostic evaluation that may be needed or ordered today. We also reviewed her medications today. she has been encouraged to call the office with any questions or concerns that should arise related to todays visit.    Counseling:    Orders Placed This Encounter  Procedures   CULTURE, URINE COMPREHENSIVE   Chlamydia/Gonococcus/Trichomonas, NAA   POCT urinalysis dipstick    Meds ordered this encounter  Medications   nitrofurantoin , macrocrystal-monohydrate, (MACROBID ) 100 MG capsule    Sig: Take 1 capsule (100 mg total) by mouth 2 (two) times daily for 7 days. Fill new script today.    Dispense:  14 capsule    Refill:  0   metroNIDAZOLE  (FLAGYL ) 500 MG tablet    Sig: Take 1 tablet (500 mg total) by mouth 2 (two) times daily for 7 days.    Dispense:  14 tablet    Refill:  0    Fill new script today    Return if symptoms worsen or fail to improve.  [place] Controlled Substance Database was reviewed by me for overdose risk score (ORS)  Time spent:30 Minutes Time spent with patient included reviewing progress notes, labs, imaging studies, and discussing plan for follow up.   This patient was seen by Laurence Pons, FNP-C in collaboration with Dr. Verneta Gone as a part of collaborative care agreement.  Zakhi Dupre R. Bobbi Burow, MSN, FNP-C Internal Medicine

## 2024-03-11 ENCOUNTER — Telehealth: Payer: Self-pay

## 2024-03-11 LAB — CULTURE, URINE COMPREHENSIVE

## 2024-03-11 LAB — CHLAMYDIA/GONOCOCCUS/TRICHOMONAS, NAA
Chlamydia by NAA: NEGATIVE
Gonococcus by NAA: NEGATIVE

## 2024-03-11 NOTE — Telephone Encounter (Signed)
 Patient notified

## 2024-03-11 NOTE — Telephone Encounter (Signed)
-----   Message from [facility] sent at 03/11/2024  7:08 AM EDT ----- Urine confirmed trichomonas which she is already taking treatment for. Make sure to finish the metronidazole  completely. If still having UTI symptoms, take the other antibiotic that was prescribed for that after finishing the metronidazole .

## 2024-03-11 NOTE — Progress Notes (Signed)
 Urine confirmed trichomonas which she is already taking treatment for. Make sure to finish the metronidazole  completely. If still having UTI symptoms, take the other antibiotic that was prescribed for that after finishing the metronidazole .

## 2024-04-06 ENCOUNTER — Ambulatory Visit: Payer: MEDICAID | Admitting: Psychiatry

## 2024-04-08 ENCOUNTER — Telehealth: Payer: Self-pay

## 2024-04-08 DIAGNOSIS — J4520 Mild intermittent asthma, uncomplicated: Secondary | ICD-10-CM

## 2024-04-08 MED ORDER — ALBUTEROL SULFATE HFA 108 (90 BASE) MCG/ACT IN AERS: 1.0000 | INHALATION_SPRAY | Freq: Four times a day (QID) | RESPIRATORY_TRACT | 5 refills | Status: AC | PRN

## 2024-04-09 ENCOUNTER — Encounter: Payer: Self-pay | Admitting: Nurse Practitioner

## 2024-04-09 NOTE — Telephone Encounter (Signed)
 Left message for patient to give office a call back.

## 2024-04-28 ENCOUNTER — Encounter: Payer: Self-pay | Admitting: Nurse Practitioner

## 2024-04-28 ENCOUNTER — Ambulatory Visit: Payer: MEDICAID | Admitting: Nurse Practitioner

## 2024-04-28 VITALS — BP 120/86 | HR 94 | Temp 97.4°F | Resp 16 | Ht 61.0 in | Wt 185.4 lb

## 2024-04-28 DIAGNOSIS — M5442 Lumbago with sciatica, left side: Secondary | ICD-10-CM

## 2024-04-28 DIAGNOSIS — B3731 Acute candidiasis of vulva and vagina: Secondary | ICD-10-CM

## 2024-04-28 DIAGNOSIS — E66812 Obesity, class 2: Secondary | ICD-10-CM

## 2024-04-28 DIAGNOSIS — F3181 Bipolar II disorder: Secondary | ICD-10-CM

## 2024-04-28 DIAGNOSIS — K219 Gastro-esophageal reflux disease without esophagitis: Secondary | ICD-10-CM | POA: Diagnosis not present

## 2024-04-28 DIAGNOSIS — M797 Fibromyalgia: Secondary | ICD-10-CM | POA: Diagnosis not present

## 2024-04-28 DIAGNOSIS — Z6839 Body mass index (BMI) 39.0-39.9, adult: Secondary | ICD-10-CM

## 2024-04-28 DIAGNOSIS — F603 Borderline personality disorder: Secondary | ICD-10-CM

## 2024-04-28 DIAGNOSIS — Z79899 Other long term (current) drug therapy: Secondary | ICD-10-CM

## 2024-04-28 DIAGNOSIS — M5441 Lumbago with sciatica, right side: Secondary | ICD-10-CM

## 2024-04-28 DIAGNOSIS — S01531A Puncture wound without foreign body of lip, initial encounter: Secondary | ICD-10-CM

## 2024-04-28 DIAGNOSIS — G8929 Other chronic pain: Secondary | ICD-10-CM

## 2024-04-28 MED ORDER — LITHIUM CARBONATE 300 MG PO TABS: 300.0000 mg | ORAL_TABLET | Freq: Three times a day (TID) | ORAL | 0 refills | Status: DC

## 2024-04-28 MED ORDER — FLUCONAZOLE 150 MG PO TABS: 150.0000 mg | ORAL_TABLET | Freq: Once | ORAL | 0 refills | Status: AC

## 2024-04-28 MED ORDER — PREGABALIN 50 MG PO CAPS: 50.0000 mg | ORAL_CAPSULE | Freq: Two times a day (BID) | ORAL | 2 refills | Status: DC

## 2024-04-28 MED ORDER — SEMAGLUTIDE-WEIGHT MANAGEMENT 1.7 MG/0.75ML ~~LOC~~ SOAJ: 1.7000 mg | SUBCUTANEOUS | 5 refills | Status: AC

## 2024-04-28 MED ORDER — OMEPRAZOLE 40 MG PO CPDR: DELAYED_RELEASE_CAPSULE | ORAL | 5 refills | Status: AC

## 2024-04-28 MED ORDER — AMOXICILLIN 500 MG PO CAPS: 500.0000 mg | ORAL_CAPSULE | Freq: Two times a day (BID) | ORAL | 0 refills | Status: AC

## 2024-04-28 MED ORDER — MELOXICAM 15 MG PO TABS: 15.0000 mg | ORAL_TABLET | Freq: Every day | ORAL | 1 refills | Status: AC

## 2024-04-28 MED ORDER — CARIPRAZINE HCL 1.5 MG PO CAPS: 1.5000 mg | ORAL_CAPSULE | Freq: Every day | ORAL | 5 refills | Status: AC

## 2024-04-28 MED ORDER — MUPIROCIN 2 % EX OINT
1.0000 | TOPICAL_OINTMENT | Freq: Two times a day (BID) | CUTANEOUS | 0 refills | Status: AC
Start: 2024-04-28 — End: ?

## 2024-04-28 NOTE — Progress Notes (Signed)
 [facility] [address] [place], [place] [address]  Internal MEDICINE  Office Visit Note  Patient Name: Claire Frederick  [id number]  [id number]  Date of Service: 04/28/2024  Chief Complaint  Patient presents with   Depression   Follow-up    Weight loss     HPI Claire Frederick presents for a follow-up visit for weight, fibromyalgia, bipolar, chronic back pain, GERD, piercing infections. Weight loss -- has lost another 7 lbs, down to 185 lbs. Is still on 1 mg dose because [facility] did not see the 1.7 mg prescription so this will be resent.  Fibromyalgia -- gabapentin  is not helping. Went to pain clinic at [facility] center and a lot of labs were done which did not really provide any new information. She decided not to follow up with the pain clinic.  Mood disorder -- wants to restart her lithium  and vraylar .  GERD -- takes omeprazole  Chronic back pain-- has been taking gabapentin  but this has not been helping much. She has 2 new lower lip piercings that are red and swollen with mild infection, needs to be treated.    Current Medication: Outpatient Encounter Medications as of 04/28/2024  Medication Sig Note   acetaminophen  (TYLENOL ) 325 MG tablet Take 1 tablet (325 mg total) by mouth every 6 (six) hours as needed for moderate pain.    acyclovir  ointment (ZOVIRAX ) 5 % Apply 1 application. topically every 3 (three) hours.    albuterol  (VENTOLIN  HFA) 108 (90 Base) MCG/ACT inhaler Inhale 1-2 puffs into the lungs every 6 (six) hours as needed for wheezing or shortness of breath.    amoxicillin (AMOXIL) 500 MG capsule Take 1 capsule (500 mg total) by mouth 2 (two) times daily for 5 days. Take with food    Eszopiclone  3 MG TABS Take 1 tablet (3 mg total) by mouth at bedtime. Take immediately before bedtime    fluconazole (DIFLUCAN) 150 MG tablet Take 1 tablet (150 mg total) by mouth once for 1 dose. May take an additional dose after 3 days if still symptomatic.    fluticasone  (FLONASE) 50 MCG/ACT nasal spray Place 2 sprays into both nostrils daily.    Fluticasone-Umeclidin-Vilant (TRELEGY ELLIPTA ) 200-62.5-25 MCG/ACT AEPB Inhale 1 puff into the lungs daily.    hydrOXYzine  (ATARAX /VISTARIL ) 25 MG tablet Take by mouth.    methocarbamol  (ROBAXIN ) 750 MG tablet Take 1-2 tablets (750-1,500 mg total) by mouth every 6 (six) hours as needed (musculoskeletal pain).    mupirocin ointment (BACTROBAN) 2 % Apply 1 Application topically 2 (two) times daily. To lip piercing bilaterally until infection resolves    ondansetron  (ZOFRAN ) 4 MG tablet Take by mouth. 10/19/2020: Last had to use approx 2-3 months ago   pregabalin (LYRICA) 50 MG capsule Take 1 capsule (50 mg total) by mouth 2 (two) times daily.    Vitamin D , Ergocalciferol , (DRISDOL ) 1.25 MG (50000 UNIT) CAPS capsule TAKE 1 CAPSULE BY MOUTH EVERY SEVEN (7) DAYS    [DISCONTINUED] cariprazine  (VRAYLAR ) 1.5 MG capsule Take 1 capsule (1.5 mg total) by mouth daily.    [DISCONTINUED] gabapentin  (NEURONTIN ) 300 MG capsule Take 1 capsule by mouth every morning and 2 capsules by mouth every night at bedtime.    [DISCONTINUED] lithium  300 MG tablet TAKE 1 TABLET BY MOUTH THREE TIMES DAILY    [DISCONTINUED] meloxicam  (MOBIC ) 15 MG tablet Take 1 tablet (15 mg total) by mouth daily. In am with breakfast    [DISCONTINUED] omeprazole  (PRILOSEC) 40 MG capsule TAKE 1 CAPSULE BY  MOUTH TWICE DAILY BEFORE A MEAL    [DISCONTINUED] phenazopyridine  (PYRIDIUM ) 200 MG tablet Take 1 tablet (200 mg total) by mouth 3 (three) times daily as needed for pain.    [DISCONTINUED] Semaglutide -Weight Management 1.7 MG/0.75ML SOAJ Inject 1.7 mg into the skin once a week.    [DISCONTINUED] sertraline  (ZOLOFT ) 100 MG tablet Take 1 tablet (100 mg total) by mouth daily.    cariprazine  (VRAYLAR ) 1.5 MG capsule Take 1 capsule (1.5 mg total) by mouth daily.    etonogestrel  (NEXPLANON ) 68 MG IMPL implant 1 each (68 mg total) by Subdermal route once for 1 dose. New implant  placed on 02/19/2023    lithium  300 MG tablet Take 1 tablet (300 mg total) by mouth 3 (three) times daily.    meloxicam  (MOBIC ) 15 MG tablet Take 1 tablet (15 mg total) by mouth daily. In am with breakfast    omeprazole  (PRILOSEC) 40 MG capsule TAKE 1 CAPSULE BY MOUTH TWICE DAILY BEFORE A MEAL    Semaglutide -Weight Management 1.7 MG/0.75ML SOAJ Inject 1.7 mg into the skin once a week.    No facility-administered encounter medications on file as of 04/28/2024.    Surgical History: Past Surgical History:  Procedure Laterality Date   ESOPHAGOGASTRODUODENOSCOPY (EGD) WITH PROPOFOL  N/A 10/19/2020   Procedure: ESOPHAGOGASTRODUODENOSCOPY (EGD) WITH BIOPSY;  Surgeon: Marnee Sink, MD;  Location: [facility];  Service: Endoscopy;  Laterality: N/A;  UPREG   ESOPHAGOGASTRODUODENOSCOPY (EGD) WITH PROPOFOL  N/A 06/18/2022   Procedure: ESOPHAGOGASTRODUODENOSCOPY (EGD) WITH PROPOFOL ;  Surgeon: Selena Daily, MD;  Location: [facility] END[facility]COPY;  Service: Gastroenterology;  Laterality: N/A;   FLEXIBLE SIGMOIDOSCOPY N/A 10/30/2021   Procedure: FLEXIBLE SIGMOIDOSCOPY;  Surgeon: Selena Daily, MD;  Location: [facility] END[facility]COPY;  Service: Gastroenterology;  Laterality: N/A;  Fleet enema 1 hr prior   LAPAROSCOPIC UNILATERAL SALPINGECTOMY Right 11/30/2016   Procedure: LAPAROSCOPIC UNILATERAL SALPINGECTOMY WITH ECTOPIC;  Surgeon: Kris Pester, MD;  Location: [facility] ORS;  Service: Gynecology;  Laterality: Right;    Medical History: 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, hard of hearing   Hernia, hiatal    Herpes simplex virus type 1 (HSV-1) dermatitis    Hypothyroidism    Multiple body piercings    including tongue   Plantar fasciitis, bilateral    Sciatica     Family  History: Family History  Problem Relation Age of Onset   Depression Mother    Cancer Mother    Arthritis Mother    Anxiety disorder Mother    Alcohol abuse Mother    ADD / ADHD Mother    Drug abuse Mother    Breast cancer Mother    Asthma Father    Hypercholesterolemia Father    Drug abuse Sister    Alcohol abuse Brother    Obesity Son    Depression Son    Asthma Son    Anxiety disorder Son    ADD / ADHD Son    Cancer Paternal Aunt    Ovarian cancer Paternal Aunt    Diabetes Paternal Uncle    Obesity Maternal Grandmother    Depression Maternal Grandmother    Cancer Maternal Grandmother    Anxiety disorder Maternal Grandmother  Breast cancer Maternal Grandmother    Cancer Maternal Grandfather    Lung cancer Maternal Grandfather    Cancer Paternal Grandmother    Ovarian cancer Paternal Grandmother     Social History   Socioeconomic History   Marital status: Legally Separated    Spouse name: Not on file   Number of children: Not on file   Years of education: Not on file   Highest education level: Not on file  Occupational History   Not on file  Tobacco Use   Smoking status: Former    Current packs/day: 0.00    Types: E-cigarettes, Cigarettes    Quit date: 11/18/2019    Years since quitting: 4.4   Smokeless tobacco: Never   Tobacco comments:    Vaping.  Vaping Use   Vaping status: Every Day  Substance and Sexual Activity   Alcohol use: Not Currently    Comment: socially   Drug use: Yes    Types: Marijuana   Sexual activity: Yes    Partners: Male    Birth control/protection: Implant  Other Topics Concern   Not on file  Social History Narrative   Not on file   Social Drivers of Health   Financial Resource Strain: Not on file  Food Insecurity: Not on file  Transportation Needs: Not on file  Physical Activity: Not on file  Stress: Not on file  Social Connections: Not on file  Intimate Partner Violence: Not on file      Review of Systems   Constitutional:  Positive for appetite change and fatigue. Negative for chills, fever and unexpected weight change.  HENT:  Positive for trouble swallowing. Negative for postnasal drip, rhinorrhea, sinus pressure, sinus pain, sneezing and tinnitus.        Hair loss  Eyes: Negative.   Respiratory:  Negative for cough, chest tightness, shortness of breath and wheezing.   Cardiovascular: Negative.  Negative for chest pain and palpitations.  Gastrointestinal:  Positive for abdominal distention (improving), abdominal pain (improving), nausea and vomiting.       Has a hiatal hernia  Endocrine: Negative.   Genitourinary: Negative.  Negative for dysuria.  Musculoskeletal:  Positive for arthralgias, back pain and myalgias.  Skin: Negative.   Neurological:  Negative for dizziness, weakness, numbness and headaches.  Hematological: Negative.   Psychiatric/Behavioral:  Positive for behavioral problems, decreased concentration, dysphoric mood and sleep disturbance. Negative for agitation, confusion, hallucinations, self-injury and suicidal ideas. The patient is nervous/anxious. The patient is not hyperactive.     Vital Signs: BP 120/86   Pulse 94   Temp (!) 97.4 F (36.3 C)   Resp 16   Ht 5' 1 (1.549 m)   Wt 185 lb 6.4 oz (84.1 kg)   SpO2 96%   BMI 35.03 kg/m    Physical Exam Vitals reviewed.  Constitutional:      General: She is not in acute distress.    Appearance: Normal appearance. She is obese. She is not ill-appearing.  HENT:     Head: Normocephalic and atraumatic.  Eyes:     Pupils: Pupils are equal, round, and reactive to light.  Cardiovascular:     Rate and Rhythm: Normal rate and regular rhythm.  Pulmonary:     Effort: Pulmonary effort is normal. No respiratory distress.  Neurological:     Mental Status: She is alert and oriented to person, place, and time.  Psychiatric:        Mood and Affect: Mood normal.  Behavior: Behavior normal.         Assessment/Plan: 1. Chronic bilateral low back pain with bilateral sciatica (Primary) Start pregabalin as prescribed. Stop gabapentin . Continue meloxicam .  - pregabalin (LYRICA) 50 MG capsule; Take 1 capsule (50 mg total) by mouth 2 (two) times daily.  Dispense: 60 capsule; Refill: 2 - meloxicam  (MOBIC ) 15 MG tablet; Take 1 tablet (15 mg total) by mouth daily. In am with breakfast  Dispense: 90 tablet; Refill: 1  2. Fibromyalgia Stop gabapentin  and start pregabalin as prescribed. Follow up in 4 weeks to reassess - pregabalin (LYRICA) 50 MG capsule; Take 1 capsule (50 mg total) by mouth 2 (two) times daily.  Dispense: 60 capsule; Refill: 2  3. Gastroesophageal reflux disease without esophagitis Continue omeprazole  as prescribed - omeprazole  (PRILOSEC) 40 MG capsule; TAKE 1 CAPSULE BY MOUTH TWICE DAILY BEFORE A MEAL  Dispense: 60 capsule; Refill: 5  4. Vulvovaginal candidiasis Fluconazole prescribed for yeast infection due to recent antibiotic use  - fluconazole (DIFLUCAN) 150 MG tablet; Take 1 tablet (150 mg total) by mouth once for 1 dose. May take an additional dose after 3 days if still symptomatic.  Dispense: 3 tablet; Refill: 0  5. Piercing in left third of lower lip Oral and topical antibiotic prescribed for superficial infection of her new piercing  - mupirocin ointment (BACTROBAN) 2 %; Apply 1 Application topically 2 (two) times daily. To lip piercing bilaterally until infection resolves  Dispense: 22 g; Refill: 0 - amoxicillin (AMOXIL) 500 MG capsule; Take 1 capsule (500 mg total) by mouth 2 (two) times daily for 5 days. Take with food  Dispense: 10 capsule; Refill: 0  6. Piercing in right third of lower lip Oral and topical antibiotic prescribed or superficial infection of her new piercing  - mupirocin ointment (BACTROBAN) 2 %; Apply 1 Application topically 2 (two) times daily. To lip piercing bilaterally until infection resolves  Dispense: 22 g; Refill: 0 -  amoxicillin (AMOXIL) 500 MG capsule; Take 1 capsule (500 mg total) by mouth 2 (two) times daily for 5 days. Take with food  Dispense: 10 capsule; Refill: 0  7. Class 2 severe obesity due to excess calories with serious comorbidity and body mass index (BMI) of 39.0 to 39.9 in adult (HCC) Increase wegovy  dose to 1.7 mg weekly. This was sent at her last visit but the pharmacy states they never received it.  - Semaglutide -Weight Management 1.7 MG/0.75ML SOAJ; Inject 1.7 mg into the skin once a week.  Dispense: 3 mL; Refill: 5  8. Lithium  use Restart lithium , we will check the lithium  level in about 1 month  - lithium  300 MG tablet; Take 1 tablet (300 mg total) by mouth 3 (three) times daily.  Dispense: 90 tablet; Refill: 0  9. Bipolar 2 disorder, major depressive episode (HCC) Restart lithium  as prescribed and vraylar  as prescribed  - lithium  300 MG tablet; Take 1 tablet (300 mg total) by mouth 3 (three) times daily.  Dispense: 90 tablet; Refill: 0 - cariprazine  (VRAYLAR ) 1.5 MG capsule; Take 1 capsule (1.5 mg total) by mouth daily.  Dispense: 30 capsule; Refill: 5  10. Borderline personality disorder (HCC) Restart lithium  as prescribed  - lithium  300 MG tablet; Take 1 tablet (300 mg total) by mouth 3 (three) times daily.  Dispense: 90 tablet; Refill: 0   General Counseling: Emelina verbalizes understanding of the findings of todays visit and agrees with plan of treatment. I have discussed any further diagnostic evaluation that may be needed or ordered  today. We also reviewed her medications today. she has been encouraged to call the office with any questions or concerns that should arise related to todays visit.    No orders of the defined types were placed in this encounter.   Meds ordered this encounter  Medications   Semaglutide -Weight Management 1.7 MG/0.75ML SOAJ    Sig: Inject 1.7 mg into the skin once a week.    Dispense:  3 mL    Refill:  5    Note increased dose, fill new script  today, discontinue 1 mg dose. PLEASE DO THIS TODAY, THIS IS THE SECOND TIME I AM SENDING IT   omeprazole  (PRILOSEC) 40 MG capsule    Sig: TAKE 1 CAPSULE BY MOUTH TWICE DAILY BEFORE A MEAL    Dispense:  60 capsule    Refill:  5    For future refills   fluconazole (DIFLUCAN) 150 MG tablet    Sig: Take 1 tablet (150 mg total) by mouth once for 1 dose. May take an additional dose after 3 days if still symptomatic.    Dispense:  3 tablet    Refill:  0    Fill new script today, discontinue gabapentin    pregabalin (LYRICA) 50 MG capsule    Sig: Take 1 capsule (50 mg total) by mouth 2 (two) times daily.    Dispense:  60 capsule    Refill:  2    Fill new script today   lithium  300 MG tablet    Sig: Take 1 tablet (300 mg total) by mouth 3 (three) times daily.    Dispense:  90 tablet    Refill:  0   meloxicam  (MOBIC ) 15 MG tablet    Sig: Take 1 tablet (15 mg total) by mouth daily. In am with breakfast    Dispense:  90 tablet    Refill:  1    Patient aware not to take with other NSAIDs with it.   cariprazine  (VRAYLAR ) 1.5 MG capsule    Sig: Take 1 capsule (1.5 mg total) by mouth daily.    Dispense:  30 capsule    Refill:  5    Note decreased dose. Discontinue 3 mg dose, and fill script for 1.5 mg dose now.   mupirocin ointment (BACTROBAN) 2 %    Sig: Apply 1 Application topically 2 (two) times daily. To lip piercing bilaterally until infection resolves    Dispense:  22 g    Refill:  0   amoxicillin (AMOXIL) 500 MG capsule    Sig: Take 1 capsule (500 mg total) by mouth 2 (two) times daily for 5 days. Take with food    Dispense:  10 capsule    Refill:  0    Fill new script today    Return in about 4 weeks (around 05/26/2024) for F/U, eval new med, Ciel Yanes PCP.   Total time spent:30 Minutes Time spent includes review of chart, medications, test results, and follow up plan with the patient.   [place] Controlled Substance Database was reviewed by me.  This patient was seen by Laurence Pons, FNP-C in collaboration with Dr. Verneta Gone as a part of collaborative care agreement.   Marlei Glomski R. Bobbi Burow, MSN, FNP-C Internal medicine

## 2024-04-29 ENCOUNTER — Encounter: Payer: Self-pay | Admitting: Nurse Practitioner

## 2024-05-22 ENCOUNTER — Other Ambulatory Visit: Payer: Self-pay | Admitting: Nurse Practitioner

## 2024-05-22 DIAGNOSIS — S01531A Puncture wound without foreign body of lip, initial encounter: Secondary | ICD-10-CM

## 2024-05-26 ENCOUNTER — Ambulatory Visit: Payer: MEDICAID | Admitting: Nurse Practitioner

## 2024-06-01 ENCOUNTER — Ambulatory Visit: Payer: MEDICAID | Admitting: Nurse Practitioner

## 2024-06-01 ENCOUNTER — Encounter: Payer: Self-pay | Admitting: Nurse Practitioner

## 2024-06-01 VITALS — BP 120/84 | HR 80 | Temp 98.2°F | Resp 16 | Ht 61.0 in | Wt 180.2 lb

## 2024-06-01 DIAGNOSIS — J4520 Mild intermittent asthma, uncomplicated: Secondary | ICD-10-CM | POA: Diagnosis not present

## 2024-06-01 DIAGNOSIS — M5441 Lumbago with sciatica, right side: Secondary | ICD-10-CM

## 2024-06-01 DIAGNOSIS — J309 Allergic rhinitis, unspecified: Secondary | ICD-10-CM | POA: Diagnosis not present

## 2024-06-01 DIAGNOSIS — E66812 Obesity, class 2: Secondary | ICD-10-CM

## 2024-06-01 DIAGNOSIS — G8929 Other chronic pain: Secondary | ICD-10-CM

## 2024-06-01 DIAGNOSIS — Z6839 Body mass index (BMI) 39.0-39.9, adult: Secondary | ICD-10-CM

## 2024-06-01 DIAGNOSIS — F603 Borderline personality disorder: Secondary | ICD-10-CM

## 2024-06-01 DIAGNOSIS — M5442 Lumbago with sciatica, left side: Secondary | ICD-10-CM | POA: Diagnosis not present

## 2024-06-01 DIAGNOSIS — Z808 Family history of malignant neoplasm of other organs or systems: Secondary | ICD-10-CM

## 2024-06-01 DIAGNOSIS — Z8639 Personal history of other endocrine, nutritional and metabolic disease: Secondary | ICD-10-CM

## 2024-06-01 DIAGNOSIS — M797 Fibromyalgia: Secondary | ICD-10-CM | POA: Diagnosis not present

## 2024-06-01 DIAGNOSIS — F3181 Bipolar II disorder: Secondary | ICD-10-CM

## 2024-06-01 DIAGNOSIS — Z91018 Allergy to other foods: Secondary | ICD-10-CM

## 2024-06-01 MED ORDER — LITHIUM CARBONATE 300 MG PO TABS
300.0000 mg | ORAL_TABLET | Freq: Three times a day (TID) | ORAL | 1 refills | Status: AC
Start: 2024-06-01 — End: ?

## 2024-06-01 MED ORDER — PREGABALIN 50 MG PO CAPS
50.0000 mg | ORAL_CAPSULE | Freq: Two times a day (BID) | ORAL | 2 refills | Status: AC
Start: 2024-06-01 — End: ?

## 2024-06-01 NOTE — Progress Notes (Signed)
 [facility] [address] [place], [place] [address]  Internal MEDICINE  Office Visit Note  Patient Name: Claire Frederick  [id number]  [id number]  Date of Service: 06/01/2024  Chief Complaint  Patient presents with   Depression   Follow-up    HPI Claire Frederick presents for a follow-up visit for weight loss, fibromyalgia, thyroid issues and bipolar.  Weight loss -- lost 5 more lbs, has lost a total of 55 lbs since may last year. Taking wegovy , currently on 1.7 mg dose which is working well.  Fibromyalgia -- switched from gabapentin  to lyrica  which is working better for the patient. Current dose is effective.  Family history of thyroid cancer and thyroid disease, prior issues with thyroid in the past. Have not checked her thyroid labs in a while.  Previously was getting allergy  immunotherapy injections. She has many environmental allergies and a few food allergies. Wants to see allergist to discuss restarting allergy  shots.  Lithium  level has been in therapeutic range, doing well, no issues.    Current Medication: Outpatient Encounter Medications as of 06/01/2024  Medication Sig Note   acetaminophen  (TYLENOL ) 325 MG tablet Take 1 tablet (325 mg total) by mouth every 6 (six) hours as needed for moderate pain.    acyclovir  ointment (ZOVIRAX ) 5 % Apply 1 application. topically every 3 (three) hours.    albuterol  (VENTOLIN  HFA) 108 (90 Base) MCG/ACT inhaler Inhale 1-2 puffs into the lungs every 6 (six) hours as needed for wheezing or shortness of breath.    cariprazine  (VRAYLAR ) 1.5 MG capsule Take 1 capsule (1.5 mg total) by mouth daily.    etonogestrel  (NEXPLANON ) 68 MG IMPL implant 1 each (68 mg total) by Subdermal route once for 1 dose. New implant placed on 02/19/2023    fluticasone (FLONASE) 50 MCG/ACT nasal spray Place 2 sprays into both nostrils daily.    Fluticasone-Umeclidin-Vilant (TRELEGY ELLIPTA ) 200-62.5-25 MCG/ACT AEPB Inhale 1 puff into the lungs daily.    hydrOXYzine   (ATARAX /VISTARIL ) 25 MG tablet Take by mouth.    lithium  300 MG tablet Take 1 tablet (300 mg total) by mouth 3 (three) times daily.    meloxicam  (MOBIC ) 15 MG tablet Take 1 tablet (15 mg total) by mouth daily. In am with breakfast    methocarbamol  (ROBAXIN ) 750 MG tablet Take 1-2 tablets (750-1,500 mg total) by mouth every 6 (six) hours as needed (musculoskeletal pain).    mupirocin  ointment (BACTROBAN ) 2 % Apply 1 Application topically 2 (two) times daily. To lip piercing bilaterally until infection resolves    omeprazole  (PRILOSEC) 40 MG capsule TAKE 1 CAPSULE BY MOUTH TWICE DAILY BEFORE A MEAL    ondansetron  (ZOFRAN ) 4 MG tablet Take by mouth. 10/19/2020: Last had to use approx 2-3 months ago   pregabalin  (LYRICA ) 50 MG capsule Take 1 capsule (50 mg total) by mouth 2 (two) times daily.    Semaglutide -Weight Management 1.7 MG/0.75ML SOAJ Inject 1.7 mg into the skin once a week.    Vitamin D , Ergocalciferol , (DRISDOL ) 1.25 MG (50000 UNIT) CAPS capsule TAKE 1 CAPSULE BY MOUTH EVERY SEVEN (7) DAYS    [DISCONTINUED] lithium  300 MG tablet Take 1 tablet (300 mg total) by mouth 3 (three) times daily.    [DISCONTINUED] pregabalin  (LYRICA ) 50 MG capsule Take 1 capsule (50 mg total) by mouth 2 (two) times daily.    No facility-administered encounter medications on file as of 06/01/2024.    Surgical History: Past Surgical History:  Procedure Laterality Date   ESOPHAGOGASTRODUODENOSCOPY (EGD) WITH PROPOFOL   N/A 10/19/2020   Procedure: ESOPHAGOGASTRODUODENOSCOPY (EGD) WITH BIOPSY;  Surgeon: Claire Carmine, MD;  Location: [facility];  Service: Endoscopy;  Laterality: N/A;  UPREG   ESOPHAGOGASTRODUODENOSCOPY (EGD) WITH PROPOFOL  N/A 06/18/2022   Procedure: ESOPHAGOGASTRODUODENOSCOPY (EGD) WITH PROPOFOL ;  Surgeon: Unk Claire Skiff, MD;  Location: [facility] ENDOSCOPY;  Service: Gastroenterology;  Laterality: N/A;   FLEXIBLE SIGMOIDOSCOPY N/A 10/30/2021   Procedure: FLEXIBLE SIGMOIDOSCOPY;  Surgeon: Unk Claire Skiff, MD;  Location: [facility] ENDOSCOPY;  Service: Gastroenterology;  Laterality: N/A;  Fleet enema 1 hr prior   LAPAROSCOPIC UNILATERAL SALPINGECTOMY Right 11/30/2016   Procedure: LAPAROSCOPIC UNILATERAL SALPINGECTOMY WITH ECTOPIC;  Surgeon: Claire JONETTA Mace, MD;  Location: [facility] ORS;  Service: Gynecology;  Laterality: Right;    Medical History: 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, hard of hearing   Hernia, hiatal    Herpes simplex virus type 1 (HSV-1) dermatitis    Hypothyroidism    Multiple body piercings    including tongue   Plantar fasciitis, bilateral    Sciatica     Family History: Family History  Problem Relation Age of Onset   Depression Mother    Cancer Mother    Arthritis Mother    Anxiety disorder Mother    Alcohol abuse Mother    ADD / ADHD Mother    Drug abuse Mother    Breast cancer Mother    Asthma Father    Hypercholesterolemia Father    Drug abuse Sister    Alcohol abuse Brother    Obesity Son    Depression Son    Asthma Son    Anxiety disorder Son    ADD / ADHD Son    Cancer Paternal Aunt    Ovarian cancer Paternal Aunt    Diabetes Paternal Uncle    Obesity Maternal Grandmother    Depression Maternal Grandmother    Cancer Maternal Grandmother    Anxiety disorder Maternal Grandmother    Breast cancer Maternal Grandmother    Cancer Maternal Grandfather    Lung cancer Maternal Grandfather    Cancer Paternal Grandmother    Ovarian cancer Paternal Grandmother     Social History   Socioeconomic History   Marital status: Legally Separated    Spouse name: Not on file   Number of children: Not on file   Years of education: Not on file   Highest education level: Not on file  Occupational History   Not on file   Tobacco Use   Smoking status: Former    Current packs/day: 0.00    Types: E-cigarettes, Cigarettes    Quit date: 11/18/2019    Years since quitting: 4.5   Smokeless tobacco: Never   Tobacco comments:    Vaping.  Vaping Use   Vaping status: Every Day  Substance and Sexual Activity   Alcohol use: Not Currently    Comment: socially   Drug use: Yes    Types: Marijuana   Sexual activity: Yes    Partners: Male    Birth control/protection: Implant  Other Topics Concern   Not on file  Social History Narrative   Not on file   Social Drivers of Health   Financial Resource Strain: Not on  file  Food Insecurity: Not on file  Transportation Needs: Not on file  Physical Activity: Not on file  Stress: Not on file  Social Connections: Not on file  Intimate Partner Violence: Not on file      Review of Systems  Constitutional:  Positive for appetite change and fatigue. Negative for chills, fever and unexpected weight change.  HENT:  Positive for trouble swallowing. Negative for postnasal drip, rhinorrhea, sinus pressure, sinus pain, sneezing and tinnitus.        Hair loss  Eyes: Negative.   Respiratory:  Negative for cough, chest tightness, shortness of breath and wheezing.   Cardiovascular: Negative.  Negative for chest pain and palpitations.  Gastrointestinal:  Positive for abdominal distention (improving), abdominal pain (improving), nausea and vomiting.       Has a hiatal hernia  Endocrine: Negative.   Genitourinary: Negative.  Negative for dysuria.  Musculoskeletal:  Positive for arthralgias, back pain and myalgias.  Skin: Negative.   Neurological:  Negative for dizziness, weakness, numbness and headaches.  Hematological: Negative.   Psychiatric/Behavioral:  Positive for behavioral problems, decreased concentration, dysphoric mood and sleep disturbance. Negative for agitation, confusion, hallucinations, self-injury and suicidal ideas. The patient is nervous/anxious. The  patient is not hyperactive.     Vital Signs: BP 120/84   Pulse 80   Temp 98.2 F (36.8 C)   Resp 16   Ht 5' 1 (1.549 m)   Wt 180 lb 3.2 oz (81.7 kg)   SpO2 99%   BMI 34.05 kg/m    Physical Exam Vitals reviewed.  Constitutional:      General: She is not in acute distress.    Appearance: Normal appearance. She is obese. She is not ill-appearing.  HENT:     Head: Normocephalic and atraumatic.  Eyes:     Pupils: Pupils are equal, round, and reactive to light.  Cardiovascular:     Rate and Rhythm: Normal rate and regular rhythm.  Pulmonary:     Effort: Pulmonary effort is normal. No respiratory distress.  Neurological:     Mental Status: She is alert and oriented to person, place, and time.  Psychiatric:        Mood and Affect: Mood normal.        Behavior: Behavior normal.        Assessment/Plan: 1. Chronic bilateral low back pain with bilateral sciatica (Primary) Continue pregabalin  as prescribed. Refills ordered. Follow up in 3 months  - pregabalin  (LYRICA ) 50 MG capsule; Take 1 capsule (50 mg total) by mouth 2 (two) times daily.  Dispense: 60 capsule; Refill: 2  2. Fibromyalgia Continue pregabalin  as prescribed  - pregabalin  (LYRICA ) 50 MG capsule; Take 1 capsule (50 mg total) by mouth 2 (two) times daily.  Dispense: 60 capsule; Refill: 2  3. Mild intermittent asthma without complication Referred to allergist - Ambulatory referral to Allergy   4. Chronic allergic rhinitis Referred to allergist - Ambulatory referral to Allergy   5. Class 2 severe obesity due to excess calories with serious comorbidity and body mass index (BMI) of 39.0 to 39.9 in adult [facility]) Continue wegovy  as prescribed.   6. Borderline personality disorder (HCC) Continue lithium  as prescribed.  - lithium  300 MG tablet; Take 1 tablet (300 mg total) by mouth 3 (three) times daily.  Dispense: 90 tablet; Refill: 1  7. Bipolar 2 disorder, major depressive episode (HCC) Continue lithium  as  prescribed.  - lithium  300 MG tablet; Take 1 tablet (300 mg total) by mouth 3 (three) times  daily.  Dispense: 90 tablet; Refill: 1  8. History of thyroid disease Thyroid labs ordered  - TSH+T4F+T3Free+ThyAbs+TPO+VD25  9. Multiple food allergies Referred to allergist - Ambulatory referral to Allergy   10. Family history of thyroid cancer Thyroid labs ordered  - TSH+T4F+T3Free+ThyAbs+TPO+VD25   General Counseling: Yaslin verbalizes understanding of the findings of todays visit and agrees with plan of treatment. I have discussed any further diagnostic evaluation that may be needed or ordered today. We also reviewed her medications today. she has been encouraged to call the office with any questions or concerns that should arise related to todays visit.    Orders Placed This Encounter  Procedures   TSH+T4F+T3Free+ThyAbs+TPO+VD25   Ambulatory referral to Allergy     Meds ordered this encounter  Medications   pregabalin  (LYRICA ) 50 MG capsule    Sig: Take 1 capsule (50 mg total) by mouth 2 (two) times daily.    Dispense:  60 capsule    Refill:  2    Fill new script today   lithium  300 MG tablet    Sig: Take 1 tablet (300 mg total) by mouth 3 (three) times daily.    Dispense:  90 tablet    Refill:  1    Return in about 3 months (around 08/25/2024) for  , F/U, pain med refill, Doye Montilla PCP, lyrica .   Total time spent:30 Minutes Time spent includes review of chart, medications, test results, and follow up plan with the patient.   [place] Controlled Substance Database was reviewed by me.  This patient was seen by Mardy Maxin, FNP-C in collaboration with Dr. Sigrid Bathe as a part of collaborative care agreement.   Sanela Evola R. Maxin, MSN, FNP-C Internal medicine

## 2024-06-02 ENCOUNTER — Telehealth: Payer: Self-pay | Admitting: Nurse Practitioner

## 2024-06-02 NOTE — Telephone Encounter (Signed)
 Awaiting 06/01/24 office notes for Allergy  referral-Toni

## 2024-06-05 ENCOUNTER — Encounter: Payer: Self-pay | Admitting: Nurse Practitioner

## 2024-06-07 ENCOUNTER — Telehealth: Payer: Self-pay | Admitting: Nurse Practitioner

## 2024-06-07 NOTE — Telephone Encounter (Signed)
 Allergy  referral sent via [facility] to [facility]. Lvm notifying patient. Gave telephone # [phone]

## 2024-06-16 ENCOUNTER — Encounter: Payer: Self-pay | Admitting: Nurse Practitioner

## 2024-06-16 ENCOUNTER — Ambulatory Visit: Payer: MEDICAID | Admitting: Nurse Practitioner

## 2024-06-16 VITALS — BP 116/84 | HR 78 | Temp 98.4°F | Resp 16 | Ht 61.0 in | Wt 175.6 lb

## 2024-06-16 DIAGNOSIS — J4540 Moderate persistent asthma, uncomplicated: Secondary | ICD-10-CM | POA: Diagnosis not present

## 2024-06-16 DIAGNOSIS — M5442 Lumbago with sciatica, left side: Secondary | ICD-10-CM

## 2024-06-16 DIAGNOSIS — M797 Fibromyalgia: Secondary | ICD-10-CM | POA: Diagnosis not present

## 2024-06-16 DIAGNOSIS — Z3169 Encounter for other general counseling and advice on procreation: Secondary | ICD-10-CM | POA: Diagnosis not present

## 2024-06-16 DIAGNOSIS — M5441 Lumbago with sciatica, right side: Secondary | ICD-10-CM

## 2024-06-16 DIAGNOSIS — G8929 Other chronic pain: Secondary | ICD-10-CM

## 2024-06-16 MED ORDER — TRELEGY ELLIPTA 200-62.5-25 MCG/ACT IN AEPB: 1.0000 | INHALATION_SPRAY | Freq: Every day | RESPIRATORY_TRACT | 3 refills | Status: AC

## 2024-06-16 NOTE — Progress Notes (Signed)
 [facility] [address] [place], [place] [address]  Internal MEDICINE  Office Visit Note  Patient Name: Claire Frederick  [id number]  [id number]  Date of Service: 06/16/2024  Chief Complaint  Patient presents with   Depression   Follow-up    Discuss medicines trying to get pregnancy.      HPI Allana presents for a follow-up visit for  Interested in getting pregnant.  Getting nexplanon  implant removed next Friday.    Current Medication: Outpatient Encounter Medications as of 06/16/2024  Medication Sig Note   acetaminophen  (TYLENOL ) 325 MG tablet Take 1 tablet (325 mg total) by mouth every 6 (six) hours as needed for moderate pain.    acyclovir  ointment (ZOVIRAX ) 5 % Apply 1 application. topically every 3 (three) hours.    albuterol  (VENTOLIN  HFA) 108 (90 Base) MCG/ACT inhaler Inhale 1-2 puffs into the lungs every 6 (six) hours as needed for wheezing or shortness of breath.    cariprazine  (VRAYLAR ) 1.5 MG capsule Take 1 capsule (1.5 mg total) by mouth daily.    etonogestrel  (NEXPLANON ) 68 MG IMPL implant 1 each (68 mg total) by Subdermal route once for 1 dose. New implant placed on 02/19/2023    fluticasone (FLONASE) 50 MCG/ACT nasal spray Place 2 sprays into both nostrils daily.    Fluticasone-Umeclidin-Vilant (TRELEGY ELLIPTA ) 200-62.5-25 MCG/ACT AEPB Inhale 1 puff into the lungs daily.    hydrOXYzine  (ATARAX /VISTARIL ) 25 MG tablet Take by mouth.    lithium  300 MG tablet Take 1 tablet (300 mg total) by mouth 3 (three) times daily.    meloxicam  (MOBIC ) 15 MG tablet Take 1 tablet (15 mg total) by mouth daily. In am with breakfast    methocarbamol  (ROBAXIN ) 750 MG tablet Take 1-2 tablets (750-1,500 mg total) by mouth every 6 (six) hours as needed (musculoskeletal pain).    mupirocin  ointment (BACTROBAN ) 2 % Apply 1 Application topically 2 (two) times daily. To lip piercing bilaterally until infection resolves    omeprazole  (PRILOSEC) 40 MG capsule TAKE 1 CAPSULE BY  MOUTH TWICE DAILY BEFORE A MEAL    ondansetron  (ZOFRAN ) 4 MG tablet Take by mouth. 10/19/2020: Last had to use approx 2-3 months ago   pregabalin  (LYRICA ) 50 MG capsule Take 1 capsule (50 mg total) by mouth 2 (two) times daily.    Semaglutide -Weight Management 1.7 MG/0.75ML SOAJ Inject 1.7 mg into the skin once a week.    Vitamin D , Ergocalciferol , (DRISDOL ) 1.25 MG (50000 UNIT) CAPS capsule TAKE 1 CAPSULE BY MOUTH EVERY SEVEN (7) DAYS    [DISCONTINUED] Fluticasone-Umeclidin-Vilant (TRELEGY ELLIPTA ) 200-62.5-25 MCG/ACT AEPB Inhale 1 puff into the lungs daily.    No facility-administered encounter medications on file as of 06/16/2024.    Surgical History: Past Surgical History:  Procedure Laterality Date   ESOPHAGOGASTRODUODENOSCOPY (EGD) WITH PROPOFOL  N/A 10/19/2020   Procedure: ESOPHAGOGASTRODUODENOSCOPY (EGD) WITH BIOPSY;  Surgeon: Jinny Carmine, MD;  Location[facility];  Service: Endoscopy;  Laterality: N/A;  UPREG   ESOPHAGOGASTRODUODENOSCOPY (EGD) WITH PROPOFOL  N/A 06/18/2022   Procedure: ESOPHAGOGASTRODUODENOSCOPY (EGD) WITH PROPOFOL ;  Surgeon: Unk Corinn Skiff, MD;  Location: [facility] ENDOSCOPY;  Service: Gastroenterology;  Laterality: N/A;   FLEXIBLE SIGMOIDOSCOPY N/A 10/30/2021   Procedure: FLEXIBLE SIGMOIDOSCOPY;  Surgeon: Unk Corinn Skiff, MD;  Location: [facility] ENDOSCOPY;  Service: Gastroenterology;  Laterality: N/A;  Fleet enema 1 hr prior   LAPAROSCOPIC UNILATERAL SALPINGECTOMY Right 11/30/2016   Procedure: LAPAROSCOPIC UNILATERAL SALPINGECTOMY WITH ECTOPIC;  Surgeon: Garnette JONETTA Mace, MD;  Location: [facility] ORS;  Service: Gynecology;  Laterality: Right;    Medical History: 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, hard of hearing   Hernia, hiatal     Herpes simplex virus type 1 (HSV-1) dermatitis    Hypothyroidism    Multiple body piercings    including tongue   Plantar fasciitis, bilateral    Sciatica     Family History: Family History  Problem Relation Age of Onset   Depression Mother    Cancer Mother    Arthritis Mother    Anxiety disorder Mother    Alcohol abuse Mother    ADD / ADHD Mother    Drug abuse Mother    Breast cancer Mother    Asthma Father    Hypercholesterolemia Father    Drug abuse Sister    Alcohol abuse Brother    Obesity Son    Depression Son    Asthma Son    Anxiety disorder Son    ADD / ADHD Son    Cancer Paternal Aunt    Ovarian cancer Paternal Aunt    Diabetes Paternal Uncle    Obesity Maternal Grandmother    Depression Maternal Grandmother    Cancer Maternal Grandmother    Anxiety disorder Maternal Grandmother    Breast cancer Maternal Grandmother    Cancer Maternal Grandfather    Lung cancer Maternal Grandfather    Cancer Paternal Grandmother    Ovarian cancer Paternal Grandmother     Social History   Socioeconomic History   Marital status: Legally Separated    Spouse name: Not on file   Number of children: Not on file   Years of education: Not on file   Highest education level: Not on file  Occupational History   Not on file  Tobacco Use   Smoking status: Former    Current packs/day: 0.00    Types: E-cigarettes, Cigarettes    Quit date: 11/18/2019    Years since quitting: 4.5   Smokeless tobacco: Never   Tobacco comments:    Vaping.  Vaping Use   Vaping status: Every Day  Substance and Sexual Activity   Alcohol use: Not Currently    Comment: socially   Drug use: Yes    Types: Marijuana   Sexual activity: Yes    Partners: Male    Birth control/protection: Implant  Other Topics Concern   Not on file  Social History Narrative   Not on file   Social Drivers of Health   Financial Resource Strain: Not on file  Food Insecurity: Not on file  Transportation  Needs: Not on file  Physical Activity: Not on file  Stress: Not on file  Social Connections: Not on file  Intimate Partner Violence: Not on file      Review of Systems  Vital Signs: BP 116/84   Pulse 78   Temp 98.4 F (36.9 C)   Resp 16   Ht 5' 1 (1.549 m)   Wt 175 lb 9.6 oz (79.7 kg)   SpO2 99%   BMI 33.18 kg/m    Physical Exam     Assessment/Plan:   General Counseling: Rumor verbalizes understanding of the findings of todays visit and agrees with plan of treatment. I  have discussed any further diagnostic evaluation that may be needed or ordered today. We also reviewed her medications today. she has been encouraged to call the office with any questions or concerns that should arise related to todays visit.    No orders of the defined types were placed in this encounter.   Meds ordered this encounter  Medications   Fluticasone-Umeclidin-Vilant (TRELEGY ELLIPTA ) 200-62.5-25 MCG/ACT AEPB    Sig: Inhale 1 puff into the lungs daily.    Dispense:  84 each    Refill:  3    Please fill as 90 day supply if possible.    Return for f/u after having baby and as needed otherwise. .   Total time spent:*** Minutes Time spent includes review of chart, medications, test results, and follow up plan with the patient.   [place] Controlled Substance Database was reviewed by me.  This patient was seen by Mardy Maxin, FNP-C in collaboration with Dr. Sigrid Bathe as a part of collaborative care agreement.   Nissan Frazzini R. Maxin, MSN, FNP-C Internal medicine

## 2024-06-16 NOTE — Patient Instructions (Signed)
 STOP -- vraylar , lithium , meloxicam , and methocarbamol   ASK OBGYN about pregablin and omeprazole .  START taking prenatal vitamin and folic acid 800 -1000 mcg daily before conceiving.

## 2024-06-17 ENCOUNTER — Encounter: Payer: Self-pay | Admitting: Nurse Practitioner

## 2024-06-17 DIAGNOSIS — M797 Fibromyalgia: Secondary | ICD-10-CM | POA: Insufficient documentation

## 2024-06-17 DIAGNOSIS — J4540 Moderate persistent asthma, uncomplicated: Secondary | ICD-10-CM | POA: Insufficient documentation

## 2024-06-18 DIAGNOSIS — Z1371 Encounter for nonprocreative screening for genetic disease carrier status: Secondary | ICD-10-CM

## 2024-06-18 HISTORY — DX: Encounter for nonprocreative screening for genetic disease carrier status: Z13.71

## 2024-06-24 NOTE — Patient Instructions (Incomplete)
Nexplanon Instructions After Removal  Keep bandage clean and dry for 24 hours  May use ice/Tylenol/Ibuprofen for soreness or pain  If you develop fever, drainage or increased warmth from incision site-contact office immediately   

## 2024-06-24 NOTE — Progress Notes (Deleted)
    GYNECOLOGY PROCEDURE NOTE  Nexplanon  removal discussed in detail.  Risks of infection, bleeding, nerve injury all reviewed.  Patient understands risks and desires to proceed.  Verbal consent obtained.  Patient is certain she wants the Nexplanon  removed.  All questions answered.  ROS  Vital Signs: There were no vitals taken for this visit. Constitutional: Well nourished, well developed female in no acute distress.  Skin: Warm and dry.  Cardiovascular: Regular rate and rhythm.   Extremity: {extremity [id number]}  Respiratory:  Normal respiratory effort Psych: Alert and Oriented x3. No memory deficits. Normal mood and affect.    Procedure: Patient placed in dorsal supine with left arm above head, elbow flexed at 90 degrees, arm resting on examination table.  Nexplanon  identified without problems.  Betadine scrub x3.  1 ml of 1% lidocaine  injected under Nexplanon  device without problems.  Sterile gloves applied.  Small 0.5cm incision made at distal tip of Nexplanon  device with 11 blade scalpel.  Nexplanon  brought to incision and grasped with a small kelly clamp.  Nexplanon  removed intact without problems.  Pressure applied to incision.  Hemostasis obtained.  Steri-strips applied, followed by bandage and compression dressing.  Patient tolerated procedure well.  No complications.   Assessment: 33 y.o. year old female now s/p uncomplicated Nexplanon  removal.  Plan: 1.  Patient given post procedure precautions and asked to call for fever, chills, redness or drainage from her incision, bleeding from incision.  She understands she will likely have a small bruise near site of removal and can remove bandage tomorrow and steri-strips in approximately 1 week.  2) Contraception***   Slater Rains, CNM [facility] 06/24/2024 3:48 PM   J2001 for lidocaine  block, 11982 for nexplanon  removal

## 2024-06-25 ENCOUNTER — Ambulatory Visit: Payer: MEDICAID | Admitting: Advanced Practice Midwife

## 2024-06-25 DIAGNOSIS — Z3046 Encounter for surveillance of implantable subdermal contraceptive: Secondary | ICD-10-CM

## 2024-07-05 ENCOUNTER — Encounter: Payer: Self-pay | Admitting: Obstetrics & Gynecology

## 2024-07-05 ENCOUNTER — Other Ambulatory Visit (HOSPITAL_COMMUNITY)
Admission: RE | Admit: 2024-07-05 | Discharge: 2024-07-05 | Disposition: A | Discharge status: Home / Self Care | Payer: MEDICAID | Source: Ambulatory Visit | Attending: Obstetrics & Gynecology | Admitting: Obstetrics & Gynecology

## 2024-07-05 ENCOUNTER — Ambulatory Visit (INDEPENDENT_AMBULATORY_CARE_PROVIDER_SITE_OTHER): Payer: MEDICAID | Admitting: Obstetrics & Gynecology

## 2024-07-05 VITALS — BP 124/84 | HR 89 | Ht 61.0 in | Wt 172.7 lb

## 2024-07-05 DIAGNOSIS — N898 Other specified noninflammatory disorders of vagina: Secondary | ICD-10-CM | POA: Diagnosis present

## 2024-07-05 DIAGNOSIS — Z803 Family history of malignant neoplasm of breast: Secondary | ICD-10-CM

## 2024-07-05 DIAGNOSIS — N96 Recurrent pregnancy loss: Secondary | ICD-10-CM | POA: Diagnosis not present

## 2024-07-05 DIAGNOSIS — Z113 Encounter for screening for infections with a predominantly sexual mode of transmission: Secondary | ICD-10-CM | POA: Insufficient documentation

## 2024-07-05 DIAGNOSIS — Z01419 Encounter for gynecological examination (general) (routine) without abnormal findings: Secondary | ICD-10-CM

## 2024-07-05 DIAGNOSIS — Z3046 Encounter for surveillance of implantable subdermal contraceptive: Secondary | ICD-10-CM | POA: Diagnosis not present

## 2024-07-05 NOTE — Addendum Note (Signed)
 Addended by: ALAINA WADDELL SAUNDERS on: 07/05/2024 08:49 AM   Modules accepted: Orders

## 2024-07-05 NOTE — Progress Notes (Signed)
 GYNECOLOGY ANNUAL PHYSICAL EXAM PROGRESS NOTE  Subjective:    Claire Frederick is a 33 y.o. single (monogamous) [phone] (40 yo son) who presents for an annual exam.  The patient is sexually active. The patient participates in regular exercise: yes. (Lifts a lot at work- [employer])  Has the patient ever been transfused or tattooed?: yes. The patient reports that there is not domestic violence in her life.   The patient has the following complaints today: She would like her Nexplanon  removed so she can get a pregnancy. She started folic acid 2 weeks ago and changed/discontinued her psych meds that may cause problems (She didn't do this with her first pregnancy and her son has autism).  2.   She has lost 4 pregnancies to miscarriage and is concerned that the next pregnancy could end in miscarriage. She has never had a work up for this.  Menstrual History: Menarche age: 30 Patient's last menstrual period was 06/20/2024 (exact date). Period Cycle (Days): 28 Period Duration (Days): 5 Period Pattern: Regular Menstrual Flow: Light, Moderate Menstrual Control: Thin pad, Maxi pad Menstrual Control Change Freq (Hours): 1-2 Dysmenorrhea: (!) Severe Dysmenorrhea Symptoms: Cramping   Gynecologic History:   History of STI's: h/o CT at age 8 Last Pap: 2024. Results were: normal.  Notes h/o abnormal pap smears ( a long time ago and no treatment, just repeat pap smear)    FH- + breast cancer - mom, mGM, mgreatGM, + ovarian cancer in pGM, no colon cancer    Upstream - 07/05/24 0817       Pregnancy Intention Screening   Does the patient want to become pregnant in the next year? Yes    Does the patient's partner want to become pregnant in the next year? Yes    Would the patient like to discuss contraceptive options today? No      Contraception Wrap Up   Current Method No Method - Other Reason    Reason for No Current Contraceptive Method at Intake ([facility] Only) Seeking  Pregnancy    Contraception Counseling Provided No    How was the end contraceptive method provided? 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, hard of hearing   Hernia, hiatal    Herpes simplex virus type 1 (HSV-1) dermatitis    Hypothyroidism    Multiple body piercings    including tongue   Plantar fasciitis, bilateral    Sciatica     Past Surgical History:  Procedure Laterality Date   ESOPHAGOGASTRODUODENOSCOPY (EGD) WITH PROPOFOL  N/A 10/19/2020   Procedure: ESOPHAGOGASTRODUODENOSCOPY (  EGD) WITH BIOPSY;  Surgeon: Jinny Carmine, MD;  Location: [facility];  Service: Endoscopy;  Laterality: N/A;  UPREG   ESOPHAGOGASTRODUODENOSCOPY (EGD) WITH PROPOFOL  N/A 06/18/2022   Procedure: ESOPHAGOGASTRODUODENOSCOPY (EGD) WITH PROPOFOL ;  Surgeon: Unk Corinn Skiff, MD;  Location: [facility];  Service: Gastroenterology;  Laterality: N/A;   FLEXIBLE SIGMOIDOSCOPY N/A 10/30/2021   Procedure: FLEXIBLE SIGMOIDOSCOPY;  Surgeon: Unk Corinn Skiff, MD;  Location: [facility];  Service: Gastroenterology;  Laterality: N/A;  Fleet enema 1 hr prior   LAPAROSCOPIC UNILATERAL SALPINGECTOMY Right 11/30/2016   Procedure: LAPAROSCOPIC UNILATERAL SALPINGECTOMY WITH ECTOPIC;  Surgeon: Garnette JONETTA Mace, MD;  Location: [facility];  Service: Gynecology;  Laterality: Right;    Family History  Problem Relation Age of Onset   Depression Mother    Cancer Mother    Arthritis Mother    Anxiety  disorder Mother    Alcohol abuse Mother    ADD / ADHD Mother    Drug abuse Mother    Breast cancer Mother    Asthma Father    Hypercholesterolemia Father    Drug abuse Sister    Alcohol abuse Brother    Obesity Son    Depression Son    Asthma Son    Anxiety disorder Son    ADD / ADHD Son    Cancer Paternal Aunt    Ovarian cancer Paternal Aunt    Diabetes Paternal Uncle    Obesity Maternal Grandmother    Depression Maternal Grandmother    Cancer Maternal Grandmother    Anxiety disorder Maternal Grandmother    Breast cancer Maternal Grandmother    Cancer Maternal Grandfather    Lung cancer Maternal Grandfather    Cancer Paternal Grandmother    Ovarian cancer Paternal Grandmother     Social History   Socioeconomic History   Marital status: Legally Separated    Spouse name: Not on file   Number of children: Not on file   Years of education: Not on file   Highest education level: Not on file  Occupational History   Not on file  Tobacco Use   Smoking status: Former    Current packs/day: 0.00    Types: E-cigarettes, Cigarettes    Quit date: 11/18/2019    Years since quitting: 4.6   Smokeless tobacco: Never   Tobacco comments:    Vaping.  Vaping Use   Vaping status: Every Day  Substance and Sexual Activity   Alcohol use: Not Currently    Comment: socially   Drug use: Yes    Types: Marijuana   Sexual activity: Yes    Partners: Male  Other Topics Concern   Not on file  Social History Narrative   Not on file   Social Drivers of Health   Financial Resource Strain: Not on file  Food Insecurity: Not on file  Transportation Needs: Not on file  Physical Activity: Not on file  Stress: Not on file  Social Connections: Not on file  Intimate Partner Violence: Not on file    Current Outpatient Medications on File Prior to Visit  Medication Sig Dispense Refill   acetaminophen  (TYLENOL ) 325 MG tablet Take 1 tablet (325 mg total) by mouth every 6 (six) hours as  needed for moderate pain.     acyclovir  ointment (ZOVIRAX ) 5 % Apply 1 application. topically every 3 (three) hours. 15 g 0   albuterol  (VENTOLIN  HFA) 108 (90 Base) MCG/ACT inhaler Inhale 1-2 puffs into the lungs every 6 (six) hours as needed for wheezing  or shortness of breath. 6.7 g 5   etonogestrel  (NEXPLANON ) 68 MG IMPL implant 1 each (68 mg total) by Subdermal route once for 1 dose. New implant placed on 02/19/2023     fluticasone (FLONASE) 50 MCG/ACT nasal spray Place 2 sprays into both nostrils daily.     Fluticasone-Umeclidin-Vilant (TRELEGY ELLIPTA ) 200-62.5-25 MCG/ACT AEPB Inhale 1 puff into the lungs daily. 84 each 3   hydrOXYzine  (ATARAX /VISTARIL ) 25 MG tablet Take by mouth.     Semaglutide -Weight Management 1.7 MG/0.75ML SOAJ Inject 1.7 mg into the skin once a week. 3 mL 5   Vitamin D , Ergocalciferol , (DRISDOL ) 1.25 MG (50000 UNIT) CAPS capsule TAKE 1 CAPSULE BY MOUTH EVERY SEVEN (7) DAYS 12 capsule 0   cariprazine  (VRAYLAR ) 1.5 MG capsule Take 1 capsule (1.5 mg total) by mouth daily. (Patient not taking: Reported on 07/05/2024) 30 capsule 5   lithium  300 MG tablet Take 1 tablet (300 mg total) by mouth 3 (three) times daily. (Patient not taking: Reported on 07/05/2024) 90 tablet 1   meloxicam  (MOBIC ) 15 MG tablet Take 1 tablet (15 mg total) by mouth daily. In am with breakfast (Patient not taking: Reported on 07/05/2024) 90 tablet 1   methocarbamol  (ROBAXIN ) 750 MG tablet Take 1-2 tablets (750-1,500 mg total) by mouth every 6 (six) hours as needed (musculoskeletal pain). (Patient not taking: Reported on 07/05/2024) 120 tablet 2   mupirocin  ointment (BACTROBAN ) 2 % Apply 1 Application topically 2 (two) times daily. To lip piercing bilaterally until infection resolves (Patient not taking: Reported on 07/05/2024) 22 g 0   omeprazole  (PRILOSEC) 40 MG capsule TAKE 1 CAPSULE BY MOUTH TWICE DAILY BEFORE A MEAL (Patient not taking: Reported on 07/05/2024) 60 capsule 5   ondansetron  (ZOFRAN ) 4 MG tablet  Take by mouth. (Patient not taking: Reported on 07/05/2024)     pregabalin  (LYRICA ) 50 MG capsule Take 1 capsule (50 mg total) by mouth 2 (two) times daily. (Patient not taking: Reported on 07/05/2024) 60 capsule 2   No current facility-administered medications on file prior to visit.    No Known Allergies   Review of Systems Constitutional: negative for chills, fatigue, fevers and sweats Eyes: negative for irritation, redness and visual disturbance Ears, nose, mouth, throat, and face: negative for hearing loss, nasal congestion, snoring and tinnitus Respiratory: negative for asthma, cough, sputum Cardiovascular: negative for chest pain, dyspnea, exertional chest pressure/discomfort, irregular heart beat, palpitations and syncope Gastrointestinal: negative for abdominal pain, change in bowel habits, nausea and vomiting Genitourinary: negative for abnormal menstrual periods, genital lesions, sexual problems and vaginal discharge, dysuria and urinary incontinence Integument/breast: negative for breast lump, breast tenderness and nipple discharge Hematologic/lymphatic: negative for bleeding and easy bruising Musculoskeletal:negative for back pain and muscle weakness Neurological: negative for dizziness, headaches, vertigo and weakness Endocrine: negative for diabetic symptoms including polydipsia, polyuria and skin dryness Allergic/Immunologic: negative for hay fever and urticaria      Objective:  Blood pressure 124/84, pulse 89, height 5' 1 (1.549 m), weight 172 lb 11.2 oz (78.3 kg), last menstrual period 06/20/2024. Body mass index is 32.63 kg/m.    General Appearance:    Alert, cooperative, no distress, appears stated age  Head:    Normocephalic, without obvious abnormality, atraumatic  Eyes:    PERRL, conjunctiva/corneas clear, EOM's intact, both eyes  Ears:    Normal external ear canals, both ears  Nose:   Nares normal, septum midline, mucosa normal, no drainage or sinus tenderness   Throat:   Lips, mucosa, and tongue  normal; teeth and gums normal  Neck:   Supple, symmetrical, trachea midline, no adenopathy; thyroid: no enlargement/tenderness/nodules; no carotid bruit or JVD  Back:     Symmetric, no curvature, ROM normal, no CVA tenderness  Lungs:     Clear to auscultation bilaterally, respirations unlabored  Chest Wall:    No tenderness or deformity   Heart:    Regular rate and rhythm, S1 and S2 normal, no murmur, rub or gallop  Breast Exam:    No tenderness, masses, or nipple abnormality  Abdomen:     Soft, non-tender, bowel sounds active all four quadrants, no masses, no organomegaly.    Genitalia:    Pelvic:external genitalia normal, vagina without lesions, discharge, or tenderness, rectovaginal septum  normal. Cervix normal in appearance, no cervical motion tenderness, no adnexal masses or tenderness.  Uterus normal size, shape, mobile, regular contours, nontender.     Extremities:   Extremities normal, atraumatic, no cyanosis or edema  Pulses:   2+ and symmetric all extremities  Skin:   Skin color, texture, turgor normal, no rashes or lesions  Lymph nodes:   Cervical, supraclavicular, and axillary nodes normal  Neurologic:   CNII-XII intact, normal strength, sensation and reflexes throughout   Consent was signed and time out was done. Her left arm was prepped with betadine after establishing the position of the Nexplanon . The area was infiltrated with 2 cc of 1% lidocaine . A small incision was made and the intact rod was easily removed and noted to be intact.  A steristrip was placed and her arm was noted to be hemostatic. It was bandaged.  She tolerated the procedure well.   Labs:  Lab Results  Component Value Date   WBC 8.3 05/27/2022   HGB 12.3 05/27/2022   HCT 36.6 05/27/2022   MCV 94 05/27/2022   PLT 255 05/27/2022    Lab Results  Component Value Date   CREATININE 0.75 11/03/2023   BUN 6 11/03/2023   NA 142 11/03/2023   K 4.0 11/03/2023   CL  106 11/03/2023   CO2 19 (L) 11/03/2023    Lab Results  Component Value Date   ALT 12 11/03/2023   AST 14 11/03/2023   ALKPHOS 64 11/03/2023   BILITOT 0.3 11/03/2023    Lab Results  Component Value Date   TSH 0.976 05/27/2022     Assessment:   Well woman exam Desire for pregnancy History of recurrent miscarriage STI screening + FH of breast cancer   Plan:  Pap smear not due Work up for recurrent AB with labs When she conceives, rec progesterone supplementation. STI screening Myraid genetic testing Continue folic acid daily   Keyasia Jolliff C, MD [facility]

## 2024-07-06 LAB — CERVICOVAGINAL ANCILLARY ONLY
Bacterial Vaginitis (gardnerella): NEGATIVE
Candida Glabrata: NEGATIVE
Candida Vaginitis: NEGATIVE
Chlamydia: NEGATIVE
Comment: NEGATIVE
Comment: NEGATIVE
Comment: NEGATIVE
Comment: NEGATIVE
Comment: NEGATIVE
Comment: NORMAL
Neisseria Gonorrhea: NEGATIVE
Trichomonas: NEGATIVE

## 2024-07-07 LAB — CARDIOLIPIN ANTIBODIES, IGM+IGG
Anticardiolipin IgG: <9 GPL U/mL (ref 0–14)
Anticardiolipin IgM: <9 [MPL'U]/mL (ref 0–12)

## 2024-07-07 LAB — TSH: TSH: 1.54 u[IU]/mL (ref 0.450–4.500)

## 2024-07-12 ENCOUNTER — Encounter: Payer: Self-pay | Admitting: Obstetrics & Gynecology

## 2024-07-20 ENCOUNTER — Encounter: Payer: Self-pay | Admitting: Obstetrics and Gynecology

## 2024-08-03 ENCOUNTER — Telehealth: Payer: Self-pay | Admitting: Obstetrics & Gynecology

## 2024-08-03 DIAGNOSIS — Z9189 Other specified personal risk factors, not elsewhere classified: Secondary | ICD-10-CM | POA: Insufficient documentation

## 2024-08-03 NOTE — Telephone Encounter (Signed)
 I called to give her the results of her genetic screening. 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 Her total lifetime risk of breast cancer is 20%. I have ordered a mammogram and she understands that she will need increased screening in the future including MRIs.  She is requesting to come in and get a Nexplanon  placed. I rec'd that she schedule this during her NMP.

## 2024-08-06 ENCOUNTER — Telehealth: Payer: Self-pay | Admitting: Nurse Practitioner

## 2024-08-06 NOTE — Telephone Encounter (Signed)
 Otolaryngology appointment 08/25/2024 with [facility] -[facility]

## 2024-08-24 ENCOUNTER — Ambulatory Visit: Payer: MEDICAID | Admitting: Nurse Practitioner

## 2024-08-24 ENCOUNTER — Ambulatory Visit: Admitting: Nurse Practitioner

## 2024-09-28 ENCOUNTER — Ambulatory Visit (INDEPENDENT_AMBULATORY_CARE_PROVIDER_SITE_OTHER): Payer: MEDICAID | Admitting: Obstetrics & Gynecology

## 2024-09-28 VITALS — BP 112/77 | HR 102 | Ht 61.0 in | Wt 172.4 lb

## 2024-09-28 DIAGNOSIS — Z3202 Encounter for pregnancy test, result negative: Secondary | ICD-10-CM

## 2024-09-28 DIAGNOSIS — Z30017 Encounter for initial prescription of implantable subdermal contraceptive: Secondary | ICD-10-CM | POA: Diagnosis not present

## 2024-09-28 LAB — POCT URINE PREGNANCY: Preg Test, Ur: NEGATIVE

## 2024-09-28 MED ORDER — ETONOGESTREL 68 MG ~~LOC~~ IMPL
68.0000 mg | DRUG_IMPLANT | Freq: Once | SUBCUTANEOUS | Status: AC
  Administered 2024-09-28: 68 mg via SUBCUTANEOUS

## 2024-09-28 NOTE — Progress Notes (Signed)
    GYNECOLOGY PROGRESS NOTE  Subjective:    Patient ID: Claire Frederick, female    DOB: 06-Mar-1991, 33 y.o.   MRN: [id number]  HPI  Patient is a 33 y.o. [id number] here for nexplanon  insertion. I removed her previous nexplanon  06/2024 because she wanted a pregnancy. However, that relationship has come to an end and she wants to restart Nexplanon . She has used this method for about 13 years.  The following portions of the patient's history were reviewed and updated as appropriate: allergies, current medications, past family history, past medical history, past social history, past surgical history, and problem list.  Review of Systems Pertinent items are noted in HPI.  Her pap is UTD  Objective:   Blood pressure 112/77, pulse (!) 102, height 5' 1 (1.549 m), weight 172 lb 6.4 oz (78.2 kg). Body mass index is 32.57 kg/m. Well nourished, well hydrated White female, no apparent distress She is ambulating and conversing normally. UPT was negative. Consent was signed. Time out procedure was done. Her right arm (her preference) was prepped with betadine and infiltrated with 3 cc of 1% lidocaine . After adequate anesthesia was assured, the Nexplanon  device was placed according to standard of care. Her arm was hemostatic and was bandaged. She tolerated the procedure well.  Assessment:   1. Insertion of implantable subdermal contraceptive      Plan:   1. Insertion of implantable subdermal contraceptive (Primary)

## 2024-09-28 NOTE — Addendum Note (Signed)
 Addended by: DELANA SAUER on: 09/28/2024 03:09 PM   Modules accepted: Orders
